# Patient Record
Sex: Female | Born: 1935 | Race: White | Hispanic: No | Marital: Married | Smoking: Never smoker
Health system: Southern US, Community
[De-identification: ages and names within clinical notes are randomized; demographics above are authoritative.]

## PROBLEM LIST (undated history)

## (undated) DIAGNOSIS — N189 Chronic kidney disease, unspecified: Secondary | ICD-10-CM

## (undated) DIAGNOSIS — K5792 Diverticulitis of intestine, part unspecified, without perforation or abscess without bleeding: Secondary | ICD-10-CM

## (undated) DIAGNOSIS — I371 Nonrheumatic pulmonary valve insufficiency: Secondary | ICD-10-CM

## (undated) DIAGNOSIS — I10 Essential (primary) hypertension: Secondary | ICD-10-CM

## (undated) DIAGNOSIS — I351 Nonrheumatic aortic (valve) insufficiency: Secondary | ICD-10-CM

## (undated) DIAGNOSIS — M8580 Other specified disorders of bone density and structure, unspecified site: Secondary | ICD-10-CM

## (undated) DIAGNOSIS — G729 Myopathy, unspecified: Secondary | ICD-10-CM

## (undated) DIAGNOSIS — K589 Irritable bowel syndrome without diarrhea: Secondary | ICD-10-CM

## (undated) DIAGNOSIS — I2510 Atherosclerotic heart disease of native coronary artery without angina pectoris: Secondary | ICD-10-CM

## (undated) DIAGNOSIS — R079 Chest pain, unspecified: Secondary | ICD-10-CM

## (undated) HISTORY — DX: Chronic kidney disease, unspecified: N18.9

## (undated) HISTORY — DX: Nonrheumatic aortic (valve) insufficiency: I35.1

## (undated) HISTORY — DX: Irritable bowel syndrome, unspecified: K58.9

## (undated) HISTORY — PX: DILATION AND CURETTAGE OF UTERUS: SHX78

## (undated) HISTORY — DX: Myopathy, unspecified: G72.9

## (undated) HISTORY — DX: Essential (primary) hypertension: I10

## (undated) HISTORY — DX: Other specified disorders of bone density and structure, unspecified site: M85.80

## (undated) HISTORY — DX: Nonrheumatic pulmonary valve insufficiency: I37.1

## (undated) HISTORY — DX: Diverticulitis of intestine, part unspecified, without perforation or abscess without bleeding: K57.92

## (undated) HISTORY — DX: Chest pain, unspecified: R07.9

## (undated) HISTORY — PX: CARDIAC CATHETERIZATION: SHX172

---

## 1997-12-09 ENCOUNTER — Other Ambulatory Visit: Admission: RE | Admit: 1997-12-09 | Discharge: 1997-12-09 | Payer: Self-pay | Admitting: *Deleted

## 1998-04-14 ENCOUNTER — Ambulatory Visit (HOSPITAL_COMMUNITY): Admission: RE | Admit: 1998-04-14 | Discharge: 1998-04-14 | Payer: Self-pay | Admitting: Gastroenterology

## 1999-05-10 ENCOUNTER — Encounter: Payer: Self-pay | Admitting: *Deleted

## 1999-05-10 ENCOUNTER — Encounter: Admission: RE | Admit: 1999-05-10 | Discharge: 1999-05-10 | Payer: Self-pay | Admitting: *Deleted

## 1999-05-20 ENCOUNTER — Other Ambulatory Visit: Admission: RE | Admit: 1999-05-20 | Discharge: 1999-05-20 | Payer: Self-pay | Admitting: *Deleted

## 1999-06-02 ENCOUNTER — Encounter: Payer: Self-pay | Admitting: *Deleted

## 1999-06-02 ENCOUNTER — Encounter: Admission: RE | Admit: 1999-06-02 | Discharge: 1999-06-02 | Payer: Self-pay | Admitting: *Deleted

## 2001-01-01 ENCOUNTER — Other Ambulatory Visit: Admission: RE | Admit: 2001-01-01 | Discharge: 2001-01-01 | Payer: Self-pay | Admitting: *Deleted

## 2001-01-01 ENCOUNTER — Encounter: Payer: Self-pay | Admitting: *Deleted

## 2001-01-01 ENCOUNTER — Encounter: Admission: RE | Admit: 2001-01-01 | Discharge: 2001-01-01 | Payer: Self-pay | Admitting: *Deleted

## 2001-01-25 ENCOUNTER — Ambulatory Visit (HOSPITAL_COMMUNITY): Admission: RE | Admit: 2001-01-25 | Discharge: 2001-01-25 | Payer: Self-pay | Admitting: Cardiology

## 2002-07-16 ENCOUNTER — Encounter: Payer: Self-pay | Admitting: Family Medicine

## 2002-07-16 ENCOUNTER — Encounter: Admission: RE | Admit: 2002-07-16 | Discharge: 2002-07-16 | Payer: Self-pay | Admitting: Family Medicine

## 2003-05-05 ENCOUNTER — Encounter (INDEPENDENT_AMBULATORY_CARE_PROVIDER_SITE_OTHER): Payer: Self-pay | Admitting: *Deleted

## 2003-05-05 ENCOUNTER — Ambulatory Visit (HOSPITAL_COMMUNITY): Admission: RE | Admit: 2003-05-05 | Discharge: 2003-05-05 | Payer: Self-pay | Admitting: Gastroenterology

## 2004-02-05 ENCOUNTER — Encounter: Admission: RE | Admit: 2004-02-05 | Discharge: 2004-02-05 | Payer: Self-pay | Admitting: Family Medicine

## 2005-12-14 ENCOUNTER — Encounter: Admission: RE | Admit: 2005-12-14 | Discharge: 2005-12-14 | Payer: Self-pay | Admitting: Family Medicine

## 2007-03-06 ENCOUNTER — Encounter: Admission: RE | Admit: 2007-03-06 | Discharge: 2007-03-06 | Payer: Self-pay | Admitting: Family Medicine

## 2008-03-24 ENCOUNTER — Encounter: Admission: RE | Admit: 2008-03-24 | Discharge: 2008-03-24 | Payer: Self-pay | Admitting: Family Medicine

## 2008-04-30 ENCOUNTER — Encounter: Admission: RE | Admit: 2008-04-30 | Discharge: 2008-04-30 | Payer: Self-pay | Admitting: Family Medicine

## 2008-06-24 ENCOUNTER — Encounter (INDEPENDENT_AMBULATORY_CARE_PROVIDER_SITE_OTHER): Payer: Self-pay | Admitting: Interventional Radiology

## 2008-06-24 ENCOUNTER — Other Ambulatory Visit: Admission: RE | Admit: 2008-06-24 | Discharge: 2008-06-24 | Payer: Self-pay | Admitting: Interventional Radiology

## 2008-06-24 ENCOUNTER — Encounter: Admission: RE | Admit: 2008-06-24 | Discharge: 2008-06-24 | Payer: Self-pay | Admitting: Family Medicine

## 2009-05-06 ENCOUNTER — Encounter: Admission: RE | Admit: 2009-05-06 | Discharge: 2009-05-06 | Payer: Self-pay | Admitting: Family Medicine

## 2010-05-11 ENCOUNTER — Other Ambulatory Visit: Payer: Self-pay | Admitting: Family Medicine

## 2010-05-11 DIAGNOSIS — Z1239 Encounter for other screening for malignant neoplasm of breast: Secondary | ICD-10-CM

## 2010-05-20 ENCOUNTER — Ambulatory Visit
Admission: RE | Admit: 2010-05-20 | Discharge: 2010-05-20 | Disposition: A | Discharge status: Home / Self Care | Payer: MEDICARE | Source: Ambulatory Visit | Attending: Family Medicine | Admitting: Family Medicine

## 2010-05-20 DIAGNOSIS — Z1239 Encounter for other screening for malignant neoplasm of breast: Secondary | ICD-10-CM

## 2010-09-02 NOTE — Op Note (Signed)
NAME:  Brooke [name], Brooke [name]                         ACCOUNT NO.:  [id number]   MEDICAL RECORD NO.:  [id number]                   PATIENT TYPE:  AMB   LOCATION:  ENDO                                 FACILITY:  [facility]   PHYSICIAN:  Bernette Redbird, M.D.                DATE OF BIRTH:  1934/10/23   DATE OF PROCEDURE:  05/05/2003  DATE OF DISCHARGE:                                 OPERATIVE REPORT   PROCEDURE PERFORMED:  Upper endoscopy with biopsies.   ENDOSCOPIST:  Florencia Reasons, M.D.   INDICATIONS FOR PROCEDURE:  Heme positive stool and altered bowel habit in a  76 year old female.   FINDINGS:  Small hiatal hernia with esophageal ring.  Mild bile reflux.   DESCRIPTION OF PROCEDURE:  The nature, purpose and risks of the procedure  had been discussed with the patient, who provided written consent.  Sedation  was fentanyl 85 mcg and Versed 8 mg IV prior to this procedure and during  the colonoscopy which followed it.  The fraction of the medication for this  procedure was fentanyl 50 mcg and Versed 5 mg.  There was no problem with  clinical instability during the course of the procedure.   The Olympus small caliber adult video endoscope was passed under direct  vision.  The larynx was not well seen but the vocal cords appeared grossly  normal.   The esophageal mucosa was normal except for an esophageal ring at the  squamocolumnar junction.  No free reflux, reflux esophagitis, Barrett's  esophagus, varices, infection or neoplasia were observed.  There was a small  2 cm hiatal hernia.  There was a little bit of bile refluxed in the stomach  coating the mucosa, without significant gastric erythema and without  erosions, ulcers, polyps or masses.  Retroflex viewing of the proximal  stomach was unremarkable.  The pylorus, duodenal bulb and second duodenum  looked normal.  Although the patient is on aspirin, I did not see any  evidence of aspirin related gastropathy.  I did obtain random  duodenal  mucosal biopsies because of history of altered bowel habit.   The scope was then removed from the patient.  She tolerated the procedure  well and there were no apparent complications.   IMPRESSION:  1. Heme positive stool without source evident on this examination (792.1).  2. Esophageal ring.  3. Mild bile reflux.   PLAN:  1. Await pathology on duodenal biopsies.  2. Proceed to colonoscopic evaluation.                                               Bernette Redbird, M.D.    RB/MEDQ  D:  05/05/2003  T:  05/05/2003  Job:  [id number]   cc:   Otilio Connors. Gerri Spore, M.D.  [phone]  [address] [place]  [place] [address]  Fax: [phone]

## 2010-09-02 NOTE — Op Note (Signed)
NAME:  Brooke [name], DOORN                         ACCOUNT NO.:  [id number]   MEDICAL RECORD NO.:  [id number]                   PATIENT TYPE:  AMB   LOCATION:  ENDO                                 FACILITY:  [facility]   PHYSICIAN:  Bernette Redbird, M.D.                DATE OF BIRTH:  15-Feb-1935   DATE OF PROCEDURE:  05/05/2003  DATE OF DISCHARGE:                                 OPERATIVE REPORT   PROCEDURE:  Colonoscopy with biopsies.   INDICATIONS FOR PROCEDURE:  Heme positive stools, family history of colon  cancer, altered bowel habit.   FINDINGS:  Fairly significant pan colonic diverticulosis with sigmoid  thickening.   DESCRIPTION OF PROCEDURE:  The nature, purpose and risks of the procedure  were familiar to the patient from prior examination  and she provided  written consent. Total sedation for this procedure and the upper endoscopy  which preceded it was Fentanyl 85 micrograms and Versed 8 mg IV. There was  no problem with clinical instability.   The Olympus adjustable tension pediatric video colonoscope was advanced to  the terminal ileum, encountering mild to moderate difficulty getting through  the sigmoid region, which had a great deal of muscular thickening associated  with diverticular  disease.   The terminal ileum had a normal appearance and pullback was performed. The  quality of the preparation was excellent and it was felt that all areas were  well seen.   The colonic mucosa was normal. I did not  see any source of heme positivity.  I did not see any polyps, cancer, colitis or vascular malformations. The  patient did have diverticulosis, both in the proximal colon, and especially  in the sigmoid region where there was a great deal of associated muscular  thickening as noted above. I did not appreciate significant diverticulosis  in the transverse colonic region.   Retroflexion in the rectum was unremarkable. Random colonic mucosal biopsies  were obtained because of  the patient's history of loose stools, altered  bowel habit and even occasional fecal incontinence.   The patient tolerated the procedure well. There were no apparent  complications.   IMPRESSION:  1. Heme positive stools without source identified on the current examination     (792.1).  2. Functional diarrhea, without obvious colitis on the current examination.  3. Family history of colon cancer without  polyps evident on the current     examination.   PLAN:  1. Await pathology results.  2. Follow up colonoscopy in 5 years because of the family history of colon     cancer.                                               Bernette Redbird, M.D.    RB/MEDQ  D:  05/05/2003  T:  05/05/2003  Job:  [id number]   cc:   Otilio Connors. Gerri Spore, M.D.  [address]  [place]  [place] [address]  Fax: [phone]

## 2010-09-02 NOTE — Cardiovascular Report (Signed)
[place]  Patient:    Brooke [name], Brooke [name] Visit Number: [id number] MRN: [id number]          Service Type: CAT Location: [facility] 2866 [id number] Attending Physician:  Eleanora Neighbor Dictated by:   Colleen Can Deborah Chalk, M.D. Proc. Date: 01/25/01 Admit Date:  01/25/2001 Discharge Date: 01/25/2001   CC:         Heather Roberts, M.D.   Cardiac Catheterization  HISTORY:  [name] has a history of longstanding hypertension.  She had an abnormal exercise tolerance test.  She has had exertional substernal discomfort and is referred for cardiac catheterization after having abnormal EKG changes on her stress test.  PROCEDURE:  Left heart catheterization with selective coronary angiography and left ventricular angiography with Perclose.  CARDIOLOGIST:  Colleen Can. Deborah Chalk, M.D.  TYPE AND SITE OF ENTRY:  Percutaneous right femoral artery.  CATHETERS:  6-French 4 curved Judkins right and left coronary catheters, 6-French pigtail ventricular guiding catheter.  CONTRAST: Omnipaque.  MEDICATIONS GIVEN PRIOR TO PROCEDURE:  Valium 10 mg p.o.  MEDICATIONS GIVEN DURING PROCEDURE:  Versed 4 mg IV.  COMMENTS:  The patient tolerated the procedure well.  HEMODYNAMIC DATA:  The aortic pressure was 181/89.  LV was 181/6-16.  The post angiographic LV was 124/6-16, and LV was 130/94.  ANGIOGRAPHIC DATA:  LEFT VENTRICULAR ANGIOGRAM:  The left ventricular angiogram was performed in the RAO position.  Overall cardiac size and silhouette were normal.  Left ventricular ejection fraction was 70 to 80%.  There was no mitral regurgitation, intracardiac calcification, or intracavitary filling defects.  CORONARY ARTERIES: 1. Right coronary artery.  The right coronary artery is a small, dominant    vessel.  It is normal. 2. Left main coronary artery.  The left main coronary artery is short and    normal. 3. Left circumflex.  The left circumflex is a large vessel with  a large    posterolateral branch.  It is normal. 4. Left anterior descending artery.  The left anterior descending was somewhat    tortuous but was normal.  OVERALL IMPRESSION: 1. Normal coronary arteries. 2. Hyperdynamic left ventricle.  DISCUSSION:  It is felt that [name] probably has hypertensive heart disease.  She does not have coronary disease. Dictated by:   Colleen Can Deborah Chalk, M.D. Attending Physician:  Eleanora Neighbor DD:  01/25/01 TD:  01/26/01 Job: [id number]

## 2010-09-02 NOTE — H&P (Signed)
[place]  Patient:    KITARA, HEBB Visit Number: [id number] MRN: [id number]          Service Type: Attending:  Colleen Can. Deborah Chalk, M.D. Dictated by:   Jennet Maduro Earl Gala, R.N., A.N.P. Adm. Date:  01/25/01   CC:         Heather Roberts, M.D.   History and Physical  DATE OF BIRTH:  [birth year]  CHIEF COMPLAINT:  Chest pain.  HISTORY OF PRESENT ILLNESS:  Mrs. Fraleigh is a very pleasant, 76 year old, white female who was referred to our office at the earlier part of this month for evaluation of chest discomfort.  She underwent routine treadmill testing. She has noted an exertional-type discomfort, especially when she walks up a slight grade or hill.  It has tended to be exertional and relieved with rest and has been occurring for quite some time.  There has been mild dyspnea on exertion as well.  There has been no other associated symptoms.  She was exercised on the standard Bruce protocol.  She exercised for a total of 8 minutes and 52 seconds to a maximum of 3.4 miles per hour at a 14% grade. Her heart rate increased to 136 and blood pressure rose to 160/60.  The test was stopped due to fatigue.  There was only minor chest discomfort that was somewhat equivocal with exercise.  Her 12-lead resting electrocardiogram was basically within normal limits. However, with exercise she developed rather marked inferior ST segment depression, as well as ST elevation in lead aVL.  Her ST segments were upsloping, but inferiorly there was approximately 3 mm of depression and laterally approximately 2 mm of depression.  In recovery, she had ST segment flattening that would be consistent with ischemia.  In light of the abnormal treadmill test, as well as her complaints of chest discomfort, she is now referred on for elective coronary angiography.  PAST MEDICAL HISTORY:  Hypertension x 15 years.  There are no reports of stroke, previous heart disease, or  abnormal cholesterol levels.  ALLERGIES:  SULFA.  CURRENT MEDICATIONS: 1. Captopril 25 mg a day. 2. Ziac 2.5/6.25 mg daily. 3. Baby aspirin daily. 4. Multivitamins daily.  FAMILY HISTORY:  Positive for heart disease, however, her mother is alive at the age of 41.  SOCIAL HISTORY:  She is married.  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  There is no alcohol or tobacco.  REVIEW OF SYSTEMS:  As stated above and is otherwise unremarkable.  PHYSICAL EXAMINATION:  She is a very pleasant white female who appears younger than her stated age.  VITAL SIGNS:  Blood pressure 140/70, heart rate 64, respirations 18.  She is afebrile.  SKIN:  Warm and dry.  Color was unremarkable.  NECK:  Supple.  No JVD.  No thyromegaly.  No carotid bruits.  LUNGS:  Clear to auscultation.  CARDIAC:  Regular rhythm without murmur or gallop.  ABDOMEN:  Soft.  Positive bowel sounds.  Nontender without masses.  EXTREMITIES:  Without edema.  Distal pulses are intact.  NEUROLOGIC:  Intact with no gross focal deficits.  She is quite pleasant and conversive.  LABORATORY DATA:  Pending.  OVERALL IMPRESSION: 1. Chest discomfort. 2. Abnormal exercise test. 3. Positive family history for coronary disease.  PLAN:  Will proceed on with elective coronary angiography.  The procedure has been discussed in lengthy detail with the patient to include the risks and benefits.  She is willing to proceed and we will plan on  undergoing cardiac catheterization on Friday, January 25, 2001. Dictated by:   Jennet Maduro Earl Gala, R.N., A.N.P. Attending:  Colleen Can. Deborah Chalk, M.D. DD:  01/23/01 TD:  01/23/01 Job: [id number]

## 2011-05-05 ENCOUNTER — Other Ambulatory Visit: Payer: Self-pay | Admitting: Family Medicine

## 2011-05-05 DIAGNOSIS — Z1231 Encounter for screening mammogram for malignant neoplasm of breast: Secondary | ICD-10-CM

## 2011-06-06 ENCOUNTER — Ambulatory Visit
Admission: RE | Admit: 2011-06-06 | Discharge: 2011-06-06 | Disposition: A | Discharge status: Home / Self Care | Payer: Medicare Other | Source: Ambulatory Visit | Attending: Family Medicine | Admitting: Family Medicine

## 2011-06-06 DIAGNOSIS — Z1231 Encounter for screening mammogram for malignant neoplasm of breast: Secondary | ICD-10-CM

## 2012-06-19 ENCOUNTER — Other Ambulatory Visit: Payer: Self-pay

## 2012-06-19 DIAGNOSIS — Z1231 Encounter for screening mammogram for malignant neoplasm of breast: Secondary | ICD-10-CM

## 2012-07-22 ENCOUNTER — Ambulatory Visit
Admission: RE | Admit: 2012-07-22 | Discharge: 2012-07-22 | Disposition: A | Discharge status: Home / Self Care | Payer: Medicare Other | Source: Ambulatory Visit

## 2012-07-22 DIAGNOSIS — Z1231 Encounter for screening mammogram for malignant neoplasm of breast: Secondary | ICD-10-CM

## 2013-07-16 ENCOUNTER — Other Ambulatory Visit (HOSPITAL_COMMUNITY): Payer: Medicare Other

## 2013-07-18 ENCOUNTER — Ambulatory Visit (HOSPITAL_COMMUNITY): Payer: Medicare Other | Attending: Cardiovascular Disease | Admitting: Cardiology

## 2013-07-18 ENCOUNTER — Encounter: Payer: Self-pay | Admitting: Cardiology

## 2013-07-18 DIAGNOSIS — I359 Nonrheumatic aortic valve disorder, unspecified: Secondary | ICD-10-CM

## 2013-07-18 DIAGNOSIS — R079 Chest pain, unspecified: Secondary | ICD-10-CM | POA: Insufficient documentation

## 2013-07-18 DIAGNOSIS — R0989 Other specified symptoms and signs involving the circulatory and respiratory systems: Secondary | ICD-10-CM | POA: Insufficient documentation

## 2013-07-18 DIAGNOSIS — R0609 Other forms of dyspnea: Secondary | ICD-10-CM | POA: Insufficient documentation

## 2013-07-18 DIAGNOSIS — I379 Nonrheumatic pulmonary valve disorder, unspecified: Secondary | ICD-10-CM | POA: Insufficient documentation

## 2013-07-18 DIAGNOSIS — I10 Essential (primary) hypertension: Secondary | ICD-10-CM | POA: Insufficient documentation

## 2013-07-18 NOTE — Progress Notes (Signed)
Echo completed

## 2013-10-14 ENCOUNTER — Other Ambulatory Visit: Payer: Self-pay

## 2013-10-14 DIAGNOSIS — Z1231 Encounter for screening mammogram for malignant neoplasm of breast: Secondary | ICD-10-CM

## 2013-10-20 ENCOUNTER — Ambulatory Visit
Admission: RE | Admit: 2013-10-20 | Discharge: 2013-10-20 | Disposition: A | Discharge status: Home / Self Care | Payer: Medicare Other | Source: Ambulatory Visit

## 2013-10-20 ENCOUNTER — Encounter (INDEPENDENT_AMBULATORY_CARE_PROVIDER_SITE_OTHER): Payer: Self-pay

## 2013-10-20 DIAGNOSIS — Z1231 Encounter for screening mammogram for malignant neoplasm of breast: Secondary | ICD-10-CM

## 2014-06-26 ENCOUNTER — Other Ambulatory Visit: Payer: Self-pay | Admitting: Dermatology

## 2014-08-11 ENCOUNTER — Encounter: Payer: Self-pay | Admitting: *Deleted

## 2014-08-14 ENCOUNTER — Encounter: Payer: Self-pay | Admitting: Cardiology

## 2014-08-14 ENCOUNTER — Ambulatory Visit (INDEPENDENT_AMBULATORY_CARE_PROVIDER_SITE_OTHER): Payer: Medicare Other | Admitting: Cardiology

## 2014-08-14 VITALS — BP 116/70 | HR 90 | Ht 68.0 in | Wt 150.0 lb

## 2014-08-14 DIAGNOSIS — I351 Nonrheumatic aortic (valve) insufficiency: Secondary | ICD-10-CM | POA: Diagnosis not present

## 2014-08-14 DIAGNOSIS — R0609 Other forms of dyspnea: Secondary | ICD-10-CM | POA: Diagnosis not present

## 2014-08-14 DIAGNOSIS — I10 Essential (primary) hypertension: Secondary | ICD-10-CM

## 2014-08-14 DIAGNOSIS — R0789 Other chest pain: Secondary | ICD-10-CM

## 2014-08-14 NOTE — Patient Instructions (Signed)
Medication Instructions:  Your physician recommends that you continue on your current medications as directed. Please refer to the Current Medication list given to you today.  Labwork: No new orders.   Testing/Procedures: Your physician has requested that you have an exercise stress myoview. For further information please visit [email or web address]. Please follow instruction sheet, as given.  Follow-Up: Your physician recommends that you schedule a follow-up appointment in: 1 MONTH with Dr Meda Coffee  Any Other Special Instructions Will Be Listed Below (If Applicable).

## 2014-08-14 NOTE — Progress Notes (Signed)
Patient ID: Brooke [name], female   DOB: [birth year], 80 y.o.   MRN: [id number]      Cardiology Office Note   Date:  08/14/2014   ID:  Brooke [name], DOB 01-31-1935, MRN [id number]  PCP:  Jonathon Bellows, MD  Cardiologist:   Dorothy Spark, MD   Chief complain: Dyspnea on exertion   History of Present Illness: Brooke [name] is a 80 y.o. female who presents for evaluation of dyspnea on exertion. The patient states that for few years she has been feeling dyspneic on exertion. She has had stress that over 10 years ago that was negative. She underwent echocardiogram the last year that showed mild-to-moderate aortic insufficiency, otherwise normal study. Patient states that her dyspnea has been worsening in frequency now associated mild exertion and associated with chest pressure and pain in her jaw. She is his example working in her backyard would lead into profound dyspnea and fatigue forcing her to take rest. The patient is an medication for blood pressure vitamin D supplements and baby aspirin. She was told in the past that her cholesterol is okay and doesn't need to be addressed. She has a family history of coronary artery disease her father had myocardial infarction in his 70s.  Past Medical History  Diagnosis Date  . HTN (hypertension)   . Osteopenia   . IBS (irritable bowel syndrome)   . Diverticulitis   . Chest pain     Past Surgical History  Procedure Laterality Date  . Cardiac catheterization    . Dilation and curettage of uterus       Current Outpatient Prescriptions  Medication Sig Dispense Refill  . aspirin 81 MG tablet Take 81 mg by mouth daily.    . Calcium-Phosphorus-Vitamin D (CITRACAL +D3 PO) Take 1 tablet by mouth daily.    . Cholecalciferol (VITAMIN D-3) 1000 UNITS CAPS Take 1,000 Units by mouth 2 (two) times daily.    . Diphenhydramine-APAP, sleep, (TYLENOL PM EXTRA STRENGTH PO) Take 1 tablet by mouth daily.    . enalapril (VASOTEC) 10 MG tablet Take 10 mg  by mouth daily.    . hydrochlorothiazide (MICROZIDE) 12.5 MG capsule Take 12.5 mg by mouth daily.    . Multiple Vitamins-Minerals (CENTRUM SILVER PO) Take 1 tablet by mouth daily.    Marland Kitchen OVER THE COUNTER MEDICATION Take by mouth daily. IMODIUM AD...Marland KitchenPATIENT TAKES HALF (1/2) TABLET    . terbinafine (LAMISIL) 1 % cream Apply 1 application topically 2 (two) times daily.    Marland Kitchen terbinafine (LAMISIL) 250 MG tablet Take 250 mg by mouth daily.     No current facility-administered medications for this visit.    Allergies:   Macrobid and Sulfa antibiotics    Social History:  The patient  reports that she has never smoked. She does not have any smokeless tobacco history on file. She reports that she drinks alcohol. She reports that she does not use illicit drugs.   Family History:  The patient's family history includes Colon cancer in her mother; Heart attack in her father; Heart disease in her brother; Hypertension in her brother.   ROS:  Please see the history of present illness.   All other systems are reviewed and negative.   PHYSICAL EXAM: VS:  BP 116/70 mmHg  Pulse 90  Ht 5\' 8"  (1.727 m)  Wt 150 lb (68.04 kg)  BMI 22.81 kg/m2 , BMI Body mass index is 22.81 kg/(m^2). GEN: Well nourished, well developed, in no acute distress HEENT:  normal Neck: no JVD, carotid bruits, or masses Cardiac: RRR; 2/6 diastolic murmur rubs, or gallops,no edema  Respiratory:  clear to auscultation bilaterally, normal work of breathing GI: soft, nontender, nondistended, + BS MS: no deformity or atrophy Skin: warm and dry, no rash Neuro:  Strength and sensation are intact Psych: euthymic mood, full affect  EKG:  EKG is ordered today. The ekg ordered today demonstrates sinus rhythm, right atrial enlargement, borderline EKG  Recent Labs: No results found for requested labs within last 365 days.   Lipid Panel No results found for: CHOL, TRIG, HDL, CHOLHDL, VLDL, LDLCALC, LDLDIRECT   Wt Readings from Last 3  Encounters:  08/14/14 150 lb (68.04 kg)    Echocardiogram performed in April 2015 Left ventricle: The cavity size was normal. There was mild concentric hypertrophy. Systolic function was normal. Wall motion was normal; there were no regional wall motion abnormalities. Doppler parameters are consistent with abnormal left ventricular relaxation (grade 1 diastolic dysfunction). - Aortic valve: Mild to moderate regurgitation.   ASSESSMENT AND PLAN:  80 year old female younger appearing  1. Typical exertional dyspnea associated with jaw pain, we'll schedule an exercise nuclear stress test.  2. Mild to moderate aortic insufficiency - for now we'll just follow  3. Hypertension - well controlled  4. Hyperlipidemia - HDL 43, triglycerides 131 LDL 109, if her stress test abnormal we will start her statin.   Current medicines are reviewed at length with the patient today.  The patient does not have concerns regarding medicines.  The following changes have been made:  no change  Labs/ tests ordered today include:   Orders Placed This Encounter  Procedures  . Myocardial Perfusion Imaging  . Electrocardiogram report   Follow up in 2 months.  Signed, Dorothy Spark, MD  08/14/2014 1:06 PM    [facility] [place], [place], [place]  [address] Phone: [phone]; Fax: [phone]

## 2014-08-25 ENCOUNTER — Telehealth (HOSPITAL_COMMUNITY): Payer: Self-pay

## 2014-08-25 NOTE — Telephone Encounter (Signed)
Patient given detailed instructions per Myocardial Perfusion Study Information Sheet for test on 08-26-2014 at 11:30am. Patient verbalized understanding. Brooke [name], Xiao Graul A

## 2014-08-26 ENCOUNTER — Ambulatory Visit (HOSPITAL_COMMUNITY): Payer: Medicare Other | Attending: Cardiology

## 2014-08-26 DIAGNOSIS — R0609 Other forms of dyspnea: Secondary | ICD-10-CM | POA: Diagnosis not present

## 2014-08-26 DIAGNOSIS — R0789 Other chest pain: Secondary | ICD-10-CM | POA: Diagnosis not present

## 2014-08-26 DIAGNOSIS — R079 Chest pain, unspecified: Secondary | ICD-10-CM | POA: Diagnosis not present

## 2014-08-26 DIAGNOSIS — I10 Essential (primary) hypertension: Secondary | ICD-10-CM | POA: Diagnosis not present

## 2014-08-26 LAB — MYOCARDIAL PERFUSION IMAGING
Estimated workload: 5.2 METS
Exercise duration (min): 3 min
Exercise duration (sec): 30 s
LV dias vol: 60 mL
LV sys vol: 13 mL
MPHR: 144 {beats}/min
Nuc Stress EF: 78 %
Peak BP: 209 mmHg
Peak HR: 144 {beats}/min
Percent HR: 100 %
Percent of predicted max HR: 100 %
RATE: 0.31
RPE: 15
Rest HR: 74 {beats}/min
SDS: 4
SRS: 5
SSS: 9
Stage 1 DBP: 74 mmHg
Stage 1 Grade: 0 %
Stage 1 HR: 84 {beats}/min
Stage 1 SBP: 157 mmHg
Stage 1 Speed: 0 mph
Stage 2 DBP: 73 mmHg
Stage 2 Grade: 0 %
Stage 2 HR: 85 {beats}/min
Stage 2 SBP: 168 mmHg
Stage 2 Speed: 0 mph
Stage 3 Grade: 0 %
Stage 3 HR: 86 {beats}/min
Stage 3 Speed: 0 mph
Stage 4 Grade: 10 %
Stage 4 HR: 139 {beats}/min
Stage 4 Speed: 1.7 mph
Stage 5 DBP: 49 mmHg
Stage 5 Grade: 12 %
Stage 5 HR: 144 {beats}/min
Stage 5 SBP: 209 mmHg
Stage 5 Speed: 2.5 mph
Stage 6 DBP: 50 mmHg
Stage 6 Grade: 0 %
Stage 6 HR: 133 {beats}/min
Stage 6 SBP: 210 mmHg
Stage 6 Speed: 0 mph
Stage 7 Grade: 0 %
Stage 7 Speed: 0 mph
TID: 1

## 2014-08-26 MED ORDER — TECHNETIUM TC 99M SESTAMIBI GENERIC - CARDIOLITE
11.0000 | Freq: Once | INTRAVENOUS | Status: AC | PRN
  Administered 2014-08-26: 11 via INTRAVENOUS

## 2014-08-26 MED ORDER — TECHNETIUM TC 99M SESTAMIBI GENERIC - CARDIOLITE
33.0000 | Freq: Once | INTRAVENOUS | Status: AC | PRN
  Administered 2014-08-26: 33 via INTRAVENOUS

## 2014-09-08 ENCOUNTER — Ambulatory Visit (INDEPENDENT_AMBULATORY_CARE_PROVIDER_SITE_OTHER): Payer: Medicare Other | Admitting: Cardiology

## 2014-09-08 ENCOUNTER — Encounter: Payer: Self-pay | Admitting: Cardiology

## 2014-09-08 VITALS — BP 148/52 | HR 94 | Ht 68.0 in | Wt 151.0 lb

## 2014-09-08 DIAGNOSIS — I351 Nonrheumatic aortic (valve) insufficiency: Secondary | ICD-10-CM | POA: Diagnosis not present

## 2014-09-08 DIAGNOSIS — I10 Essential (primary) hypertension: Secondary | ICD-10-CM

## 2014-09-08 DIAGNOSIS — R0609 Other forms of dyspnea: Secondary | ICD-10-CM

## 2014-09-08 MED ORDER — AMLODIPINE BESYLATE 2.5 MG PO TABS: 2.5000 mg | ORAL_TABLET | Freq: Every day | ORAL | Status: DC

## 2014-09-08 NOTE — Progress Notes (Signed)
Patient ID: RAJVI ARMENTOR, female   DOB: 1935-02-28, 80 y.o.   MRN: [id number]      Cardiology Office Note   Date:  09/08/2014   ID:  LALANIA HASEMAN, DOB [birth year], MRN [id number]  PCP:  Jonathon Bellows, MD  Cardiologist:   Dorothy Spark, MD   Chief complain: Dyspnea on exertion   History of Present Illness: Brooke [name] is a 80 y.o. female who presents for evaluation of dyspnea on exertion. The patient states that for few years she has been feeling dyspneic on exertion. She has had stress that over 10 years ago that was negative. She underwent echocardiogram the last year that showed mild-to-moderate aortic insufficiency, otherwise normal study. Patient states that her dyspnea has been worsening in frequency now associated mild exertion and associated with chest pressure and pain in her jaw. She is his example working in her backyard would lead into profound dyspnea and fatigue forcing her to take rest. The patient is an medication for blood pressure vitamin D supplements and baby aspirin. She was told in the past that her cholesterol is okay and doesn't need to be addressed. She has a family history of coronary artery disease her father had myocardial infarction in his 36s.  The patient had a negative stress test for ischemia, but hypertensive response to stress, she continues to have significant DOE limiting her ADL.  Past Medical History  Diagnosis Date  . HTN (hypertension)   . Osteopenia   . IBS (irritable bowel syndrome)   . Diverticulitis   . Chest pain     Past Surgical History  Procedure Laterality Date  . Cardiac catheterization    . Dilation and curettage of uterus       Current Outpatient Prescriptions  Medication Sig Dispense Refill  . aspirin 81 MG tablet Take 81 mg by mouth daily.    . Calcium-Phosphorus-Vitamin D (CITRACAL +D3 PO) Take 1 tablet by mouth daily.    . Cholecalciferol (VITAMIN D-3) 1000 UNITS CAPS Take 1,000 Units by mouth daily.     .  enalapril (VASOTEC) 10 MG tablet Take 10 mg by mouth daily.    . hydrochlorothiazide (MICROZIDE) 12.5 MG capsule Take 12.5 mg by mouth daily.    . Loperamide HCl (IMODIUM PO) Take 0.5 tablets by mouth as needed.    . Multiple Vitamins-Minerals (CENTRUM SILVER PO) Take 1 tablet by mouth daily.    Marland Kitchen terbinafine (LAMISIL) 1 % cream Apply 1 application topically 2 (two) times daily.    Marland Kitchen terbinafine (LAMISIL) 250 MG tablet Take 250 mg by mouth daily.     No current facility-administered medications for this visit.    Allergies:   Macrobid and Sulfa antibiotics    Social History:  The patient  reports that she has never smoked. She does not have any smokeless tobacco history on file. She reports that she drinks alcohol. She reports that she does not use illicit drugs.   Family History:  The patient's family history includes Colon cancer in her mother; Heart attack in her father; Heart disease in her brother; Hypertension in her brother.   ROS:  Please see the history of present illness.   All other systems are reviewed and negative.   PHYSICAL EXAM: VS:  BP 148/52 mmHg  Pulse 94  Ht 5\' 8"  (1.727 m)  Wt 151 lb (68.493 kg)  BMI 22.96 kg/m2  SpO2 97% , BMI Body mass index is 22.96 kg/(m^2). GEN: Well nourished, well developed,  in no acute distress HEENT: normal Neck: no JVD, carotid bruits, or masses Cardiac: RRR; 2/6 diastolic murmur rubs, or gallops,no edema  Respiratory:  clear to auscultation bilaterally, normal work of breathing GI: soft, nontender, nondistended, + BS MS: no deformity or atrophy Skin: warm and dry, no rash Neuro:  Strength and sensation are intact Psych: euthymic mood, full affect  EKG:  EKG is ordered today. The ekg ordered today demonstrates sinus rhythm, right atrial enlargement, borderline EKG  Recent Labs: No results found for requested labs within last 365 days.   Lipid Panel No results found for: CHOL, TRIG, HDL, CHOLHDL, VLDL, LDLCALC, LDLDIRECT     Wt Readings from Last 3 Encounters:  09/08/14 151 lb (68.493 kg)  08/26/14 148 lb (67.132 kg)  08/14/14 150 lb (68.04 kg)    Echocardiogram performed in April 2015 Left ventricle: The cavity size was normal. There was mild concentric hypertrophy. Systolic function was normal. Wall motion was normal; there were no regional wall motion abnormalities. Doppler parameters are consistent with abnormal left ventricular relaxation (grade 1 diastolic dysfunction). - Aortic valve: Mild to moderate regurgitation.   ASSESSMENT AND PLAN:  80 year old female younger appearing  1. Typical exertional dyspnea associated with jaw pain, an exercise nuclear stress test showed poor exercise capacity, no infarct or ischemia but hypertensive response to stress. There is mild concentric LVH on echocardiogram. We will add amlodipine 2.5 mg PO daily. We will oredr PFTs and refer to pulmonary. If PFTs normal we will consider work up for chronic thromboembolic disease.  2. Mild to moderate aortic insufficiency - add amlodipine, repeat echo in 2 years  3. Hypertension - add amlodipine  4. Hyperlipidemia - HDL 43, triglycerides 131 LDL 109,lifestyle modifications for now.  Current medicines are reviewed at length with the patient today.  The patient does not have concerns regarding medicines.  The following changes have been made:  no change  Labs/ tests ordered today include:   Orders Placed This Encounter  Procedures  . Myocardial Perfusion Imaging  . Electrocardiogram report   Follow up in 3 months.  Signed, Dorothy Spark, MD  09/08/2014 3:37 PM    [name] [place], [place], [place]  [address] Phone: [phone]; Fax: [phone]

## 2014-09-08 NOTE — Patient Instructions (Signed)
Medication Instructions:   Your physician has recommended you make the following change in your medication:   START TAKING AMLODIPINE 2.5 MG ONCE DAILY    Testing/Procedures:   Your physician has recommended that you have a pulmonary function test. Pulmonary Function Tests are a group of tests that measure how well air moves in and out of your lungs.     You have been referred to PULMONOLOGY FOR DYSPNEA ON EXERTION    Follow-Up:  3 MONTHS WITH DR Meda Coffee

## 2014-09-15 ENCOUNTER — Ambulatory Visit (HOSPITAL_COMMUNITY)
Admission: RE | Admit: 2014-09-15 | Discharge: 2014-09-15 | Disposition: A | Discharge status: Home / Self Care | Payer: Medicare Other | Source: Ambulatory Visit | Attending: Cardiology | Admitting: Cardiology

## 2014-09-15 DIAGNOSIS — R0609 Other forms of dyspnea: Secondary | ICD-10-CM

## 2014-09-15 LAB — PULMONARY FUNCTION TEST
DL/VA % pred: 51 %
DL/VA: 2.67 ml/min/mmHg/L
DLCO unc % pred: 41 %
DLCO unc: 11.86 ml/min/mmHg
FEF 25-75 Post: 2.29 L/sec
FEF 25-75 Pre: 2.51 L/sec
FEF2575-%Change-Post: -8 %
FEF2575-%Pred-Post: 134 %
FEF2575-%Pred-Pre: 147 %
FEV1-%Change-Post: -1 %
FEV1-%Pred-Post: 98 %
FEV1-%Pred-Pre: 100 %
FEV1-Post: 2.28 L
FEV1-Pre: 2.32 L
FEV1FVC-%Change-Post: -1 %
FEV1FVC-%Pred-Pre: 111 %
FEV6-%Change-Post: 1 %
FEV6-%Pred-Post: 95 %
FEV6-%Pred-Pre: 93 %
FEV6-Post: 2.79 L
FEV6-Pre: 2.75 L
FEV6FVC-%Change-Post: 0 %
FEV6FVC-%Pred-Post: 105 %
FEV6FVC-%Pred-Pre: 105 %
FVC-%Change-Post: 0 %
FVC-%Pred-Post: 90 %
FVC-%Pred-Pre: 91 %
FVC-Post: 2.8 L
FVC-Pre: 2.82 L
Post FEV1/FVC ratio: 81 %
Post FEV6/FVC ratio: 100 %
Pre FEV1/FVC ratio: 82 %
Pre FEV6/FVC Ratio: 100 %
RV % pred: 320 %
RV: 8 L
TLC % pred: 200 %
TLC: 11.03 L

## 2014-09-15 MED ORDER — ALBUTEROL SULFATE (2.5 MG/3ML) 0.083% IN NEBU
2.5000 mg | INHALATION_SOLUTION | Freq: Once | RESPIRATORY_TRACT | Status: AC
  Administered 2014-09-15: 2.5 mg via RESPIRATORY_TRACT

## 2014-09-17 ENCOUNTER — Ambulatory Visit (INDEPENDENT_AMBULATORY_CARE_PROVIDER_SITE_OTHER)
Admission: RE | Admit: 2014-09-17 | Discharge: 2014-09-17 | Disposition: A | Discharge status: Home / Self Care | Payer: Medicare Other | Source: Ambulatory Visit | Attending: Cardiology | Admitting: Cardiology

## 2014-09-17 ENCOUNTER — Telehealth: Payer: Self-pay | Admitting: *Deleted

## 2014-09-17 DIAGNOSIS — R942 Abnormal results of pulmonary function studies: Secondary | ICD-10-CM | POA: Diagnosis not present

## 2014-09-17 DIAGNOSIS — R0602 Shortness of breath: Secondary | ICD-10-CM

## 2014-09-17 DIAGNOSIS — R0609 Other forms of dyspnea: Secondary | ICD-10-CM

## 2014-09-17 NOTE — Telephone Encounter (Signed)
-----   Message from Dorothy Spark, MD sent at 09/17/2014 10:25 AM EDT ----- She has abnormal PFTs and needs to be referred to pulmonary

## 2014-09-17 NOTE — Telephone Encounter (Signed)
Contacted the pt to inform her that per Dr Meda Coffee her PFT results were abnormal and she suggested the pt keep her scheduled appt with Pulmonary with Dr Lamonte Sakai on 6/7 at 3:30 pm, and also have a 2-view chest xray done.  Informed the pt that I will place the chest x ray order in the system and informed her of many different locations to go and get this image performed.  Pt selected to have her chest x ray ordered at [facility] on [address].  Informed the pt she can walk-in to have this done, not time specified.  Pt verbalized understanding and agrees with this plan.

## 2014-09-22 ENCOUNTER — Encounter: Payer: Self-pay | Admitting: Emergency Medicine

## 2014-09-22 ENCOUNTER — Ambulatory Visit (INDEPENDENT_AMBULATORY_CARE_PROVIDER_SITE_OTHER): Payer: Medicare Other | Admitting: Emergency Medicine

## 2014-09-22 VITALS — BP 120/70 | HR 85 | Ht 67.0 in | Wt 148.0 lb

## 2014-09-22 DIAGNOSIS — R0609 Other forms of dyspnea: Secondary | ICD-10-CM

## 2014-09-22 DIAGNOSIS — R0600 Dyspnea, unspecified: Secondary | ICD-10-CM

## 2014-09-22 NOTE — Patient Instructions (Signed)
Your airflow on your pulmonary function testing is normal We will repeat a component of your pulmonary function testing to compare with May 2016. If we see an abnormality then we will initiate further workup.  Walking oximetry today Follow with Dr Lamonte Sakai next available after your testing.

## 2014-09-22 NOTE — Assessment & Plan Note (Signed)
Etiology of her dyspnea is unclear. She does note that she was hypertensive when she had her myocardial perfusion scan. She feels better after being changed to amlodipine from her beta blocker. Pulmonary function testing is interesting- shows normal airflow, a decreased diffusion capacity, and significant hyperinflation. I suspect that we are getting false data as the hyperinflation does not make sense here. He is to have repeat lung volumes and diffusion capacity performed. If her diffusion capacity remains low then we will expand her workup to look for interstitial or pulmonary vascular disease. There is no evidence of pulmonary hypertension on her echocardiogram.

## 2014-09-22 NOTE — Progress Notes (Signed)
Subjective:    Patient ID: Brooke [name], female    DOB: 1935/02/25, 80 y.o.   MRN: [id number]  HPI 80 year old never smoker with a history of hypertension, IBS, she has been under evaluation by Dr. Meda Coffee in cardiology for exertional dyspnea. My review of the medical chart shows that she had an echocardiogram in 78/2015 that showed some aortic insufficiency without any left ventricular dysfunction. Based on her dyspnea she underwent a myocardial perfusion study on 08/25/76 there was a low risk pharmacological test with normal left ventricular function but she did have severe HTN during the test. PFT were performed on 09/14/76 and reviewed by me > normal airflows, ? Whether the lung volumes are accurate (significant hyperinflation), decrease DLCO. I reviewed her chest x-ray from 09/16/76.   Beginning many yrs ago noted exertional SOB associated with CP and neck tightness. This year it has been more bothersome. Unable to do her yard work this year. She wonders if maybe some of her problems related to changes in her medications, her b-blocker. She has now been changed to amlodipine (new) + enalapril + HCTZ.    Review of Systems  Constitutional: Negative for fever and unexpected weight change.  HENT: Negative for congestion, dental problem, ear pain, nosebleeds, postnasal drip, rhinorrhea, sinus pressure, sneezing, sore throat and trouble swallowing.   Eyes: Negative for redness and itching.  Respiratory: Positive for chest tightness and shortness of breath. Negative for cough and wheezing.   Cardiovascular: Negative for palpitations and leg swelling.  Gastrointestinal: Negative for nausea and vomiting.  Genitourinary: Negative for dysuria.  Musculoskeletal: Negative for joint swelling.  Skin: Negative for rash.  Neurological: Negative for headaches.  Hematological: Does not bruise/bleed easily.  Psychiatric/Behavioral: Negative for dysphoric mood. The patient is not nervous/anxious.    Past  Medical History  Diagnosis Date  . HTN (hypertension)   . Osteopenia   . IBS (irritable bowel syndrome)   . Diverticulitis   . Chest pain      Family History  Problem Relation Age of Onset  . Heart attack Father   . Colon cancer Mother   . Heart disease Brother   . Hypertension Brother      History   Social History  . Marital Status: Married    Spouse Name: N/A  . Number of Children: N/A  . Years of Education: N/A   Occupational History  . Not on file.   Social History Main Topics  . Smoking status: Never Smoker   . Smokeless tobacco: Not on file  . Alcohol Use: 0.0 oz/week    0 Standard drinks or equivalent per week  . Drug Use: No  . Sexual Activity: Not on file   Other Topics Concern  . Not on file   Social History Narrative     Allergies  Allergen Reactions  . Macrobid [Nitrofurantoin Monohyd Macro] Hives and Rash  . Sulfa Antibiotics Hives and Rash     Outpatient Prescriptions Prior to Visit  Medication Sig Dispense Refill  . amLODipine (NORVASC) 2.5 MG tablet Take 1 tablet (2.5 mg total) by mouth daily. 180 tablet 3  . aspirin 81 MG tablet Take 81 mg by mouth daily.    . Calcium-Phosphorus-Vitamin D (CITRACAL +D3 PO) Take 1 tablet by mouth daily.    . Cholecalciferol (VITAMIN D-3) 1000 UNITS CAPS Take 1,000 Units by mouth daily.     . enalapril (VASOTEC) 10 MG tablet Take 10 mg by mouth daily.    . hydrochlorothiazide (MICROZIDE)  12.5 MG capsule Take 12.5 mg by mouth daily.    . Loperamide HCl (IMODIUM PO) Take 0.5 tablets by mouth as needed.    . Multiple Vitamins-Minerals (CENTRUM SILVER PO) Take 1 tablet by mouth daily.    Marland Kitchen terbinafine (LAMISIL) 1 % cream Apply 1 application topically 2 (two) times daily.    Marland Kitchen terbinafine (LAMISIL) 250 MG tablet Take 250 mg by mouth daily.     No facility-administered medications prior to visit.        Objective:   Physical Exam Filed Vitals:   09/22/14 1533  BP: 120/70  Pulse: 85  Height: 5\' 7"  (1.702  m)  Weight: 148 lb (67.132 kg)  SpO2: 94%   Gen: Pleasant, well-nourished, in no distress,  normal affect  ENT: No lesions,  mouth clear,  oropharynx clear, no postnasal drip  Neck: No JVD, no TMG, no carotid bruits  Lungs: No use of accessory muscles, clear without rales or rhonchi  Cardiovascular: RRR, heart sounds normal, no murmur or gallops, no peripheral edema  Musculoskeletal: No deformities, no cyanosis or clubbing  Neuro: alert, non focal  Skin: Warm, no lesions or rashes   09/17/14 --  COMPARISON: None.  FINDINGS: The lungs are hyperinflated with mild hemidiaphragm flattening. There is hazy increased density in the right infrahilar region without air bronchograms. The heart and pulmonary vascularity are normal. The mediastinum is normal in width. There is no pleural effusion. There is minimal apical pleural thickening bilaterally. There is gentle dextrocurvature of the mid to lower thoracic spine. The vertebral bodies are preserved in height.  IMPRESSION: COPD. There may be superimposed acute bronchitic changes. There is no pneumonia nor CHF     Assessment & [name]:  DOE (dyspnea on exertion) Etiology of her dyspnea is unclear. She does note that she was hypertensive when she had her myocardial perfusion scan. She feels better after being changed to amlodipine from her beta blocker. Pulmonary function testing is interesting- shows normal airflow, a decreased diffusion capacity, and significant hyperinflation. I suspect that we are getting false data as the hyperinflation does not make sense here. He is to have repeat lung volumes and diffusion capacity performed. If her diffusion capacity remains low then we will expand her workup to look for interstitial or pulmonary vascular disease. There is no evidence of pulmonary hypertension on her echocardiogram.

## 2014-11-13 ENCOUNTER — Ambulatory Visit: Payer: Medicare Other | Admitting: Emergency Medicine

## 2014-12-10 ENCOUNTER — Ambulatory Visit (INDEPENDENT_AMBULATORY_CARE_PROVIDER_SITE_OTHER): Payer: Medicare Other | Admitting: Cardiology

## 2014-12-10 ENCOUNTER — Encounter: Payer: Self-pay | Admitting: Cardiology

## 2014-12-10 VITALS — BP 134/62 | HR 83 | Ht 67.0 in | Wt 151.0 lb

## 2014-12-10 DIAGNOSIS — I351 Nonrheumatic aortic (valve) insufficiency: Secondary | ICD-10-CM

## 2014-12-10 DIAGNOSIS — I10 Essential (primary) hypertension: Secondary | ICD-10-CM

## 2014-12-10 DIAGNOSIS — R0609 Other forms of dyspnea: Secondary | ICD-10-CM | POA: Diagnosis not present

## 2014-12-10 DIAGNOSIS — E785 Hyperlipidemia, unspecified: Secondary | ICD-10-CM

## 2014-12-10 DIAGNOSIS — R0600 Dyspnea, unspecified: Secondary | ICD-10-CM

## 2014-12-10 NOTE — Patient Instructions (Signed)
Your physician recommends that you continue on your current medications as directed. Please refer to the Current Medication list given to you today.    Your physician recommends that you schedule a follow-up appointment in: 3 MONTHS WITH DR NELSON  

## 2014-12-10 NOTE — Progress Notes (Signed)
Patient ID: Brooke [name], female   DOB: [birth year], 80 y.o.   MRN: [id number]      Cardiology Office Note   Date:  12/10/2014   ID:  Brooke [name], DOB May 05, 1934, MRN [id number]  PCP:  Jonathon Bellows, MD  Cardiologist:   Dorothy Spark, MD   Chief complain: Dyspnea on exertion   History of Present Illness:  Brooke [name] is a 80 y.o. female who presents for evaluation of dyspnea on exertion. The patient states that for few years she has been feeling dyspneic on exertion. She has had stress that over 10 years ago that was negative. She underwent echocardiogram the last year that showed mild-to-moderate aortic insufficiency, otherwise normal study. Patient states that her dyspnea has been worsening in frequency now associated mild exertion and associated with chest pressure and pain in her jaw. She is his example working in her backyard would lead into profound dyspnea and fatigue forcing her to take rest. The patient is an medication for blood pressure vitamin D supplements and baby aspirin. She was told in the past that her cholesterol is okay and doesn't need to be addressed. She has a family history of coronary artery disease her father had myocardial infarction in his 63s.  12/10/14 - The patient had a negative stress test for ischemia, but hypertensive response to stress, BB was discontinued and she was started on amlodipine with significant improvement of symptoms but still feels SOB, unable to walk around the block. She was evaluated by pulmonary who believes that her PFTs were erroneously abnormal.   Past Medical History  Diagnosis Date  . HTN (hypertension)   . Osteopenia   . IBS (irritable bowel syndrome)   . Diverticulitis   . Chest pain     Past Surgical History  Procedure Laterality Date  . Cardiac catheterization    . Dilation and curettage of uterus       Current Outpatient Prescriptions  Medication Sig Dispense Refill  . amLODipine (NORVASC) 2.5 MG tablet  Take 1 tablet (2.5 mg total) by mouth daily. 180 tablet 3  . aspirin 81 MG tablet Take 81 mg by mouth daily.    . Calcium-Phosphorus-Vitamin D (CITRACAL +D3 PO) Take 1 tablet by mouth daily.    . Cholecalciferol (VITAMIN D-3) 1000 UNITS CAPS Take 1,000 Units by mouth daily.     . enalapril (VASOTEC) 10 MG tablet Take 10 mg by mouth daily.    . hydrochlorothiazide (MICROZIDE) 12.5 MG capsule Take 12.5 mg by mouth daily.    . Loperamide HCl (IMODIUM PO) Take 0.5 tablets by mouth as needed.    . Multiple Vitamins-Minerals (CENTRUM SILVER PO) Take 1 tablet by mouth daily.     No current facility-administered medications for this visit.    Allergies:   Macrobid and Sulfa antibiotics    Social History:  The patient  reports that she has never smoked. She does not have any smokeless tobacco history on file. She reports that she drinks alcohol. She reports that she does not use illicit drugs.   Family History:  The patient's family history includes Colon cancer in her mother; Heart attack in her father; Heart disease in her brother; Hypertension in her brother.   ROS:  Please see the history of present illness.   All other systems are reviewed and negative.   PHYSICAL EXAM: VS:  BP 134/62 mmHg  Pulse 83  Ht 5\' 7"  (1.702 m)  Wt 151 lb (68.493 kg)  BMI 23.64 kg/m2  SpO2 98% , BMI Body mass index is 23.64 kg/(m^2). GEN: Well nourished, well developed, in no acute distress HEENT: normal Neck: no JVD, carotid bruits, or masses Cardiac: RRR; 2/6 diastolic murmur rubs, or gallops,no edema  Respiratory:  clear to auscultation bilaterally, normal work of breathing GI: soft, nontender, nondistended, + BS MS: no deformity or atrophy Skin: warm and dry, no rash Neuro:  Strength and sensation are intact Psych: euthymic mood, full affect  EKG:  EKG is ordered today. The ekg ordered today demonstrates sinus rhythm, right atrial enlargement, borderline EKG  Recent Labs: No results found for  requested labs within last 365 days.   Lipid Panel No results found for: CHOL, TRIG, HDL, CHOLHDL, VLDL, LDLCALC, LDLDIRECT   Wt Readings from Last 3 Encounters:  12/10/14 151 lb (68.493 kg)  09/22/14 148 lb (67.132 kg)  09/08/14 151 lb (68.493 kg)    Echocardiogram performed in April 2015 Left ventricle: The cavity size was normal. There was mild concentric hypertrophy. Systolic function was normal. Wall motion was normal; there were no regional wall motion abnormalities. Doppler parameters are consistent with abnormal left ventricular relaxation (grade 1 diastolic dysfunction). - Aortic valve: Mild to moderate regurgitation.   ASSESSMENT AND PLAN:  80 year old female younger appearing  1. Exertional dyspnea - an exercise nuclear stress test showed poor exercise capacity, no infarct or ischemia but hypertensive response to stress. There is mild concentric LVH on echocardiogram. Significant improvement with amlodipine 2.5 mg PO daily, we will increase to 5 mg po daily. Follow up in 3 months.  Encourage to keep exercising.  2. Mild to moderate aortic insufficiency - add amlodipine, repeat echo in 2 years  3. Hypertension - add amlodipine  4. Hyperlipidemia - HDL 43, triglycerides 131 LDL 109,lifestyle modifications for now.  Follow up in 3 months.  Signed, Dorothy Spark, MD  12/10/2014 3:42 PM    [facility] [place], [place], [place]  [address] Phone: [phone]; Fax: [phone]

## 2015-03-22 ENCOUNTER — Encounter: Payer: Self-pay | Admitting: Cardiology

## 2015-03-22 ENCOUNTER — Ambulatory Visit (INDEPENDENT_AMBULATORY_CARE_PROVIDER_SITE_OTHER): Payer: Medicare Other | Admitting: Cardiology

## 2015-03-22 VITALS — BP 118/68 | HR 80 | Ht 68.5 in | Wt 152.4 lb

## 2015-03-22 DIAGNOSIS — I10 Essential (primary) hypertension: Secondary | ICD-10-CM | POA: Insufficient documentation

## 2015-03-22 DIAGNOSIS — R0600 Dyspnea, unspecified: Secondary | ICD-10-CM

## 2015-03-22 DIAGNOSIS — R0609 Other forms of dyspnea: Secondary | ICD-10-CM | POA: Diagnosis not present

## 2015-03-22 DIAGNOSIS — I351 Nonrheumatic aortic (valve) insufficiency: Secondary | ICD-10-CM | POA: Diagnosis not present

## 2015-03-22 DIAGNOSIS — I517 Cardiomegaly: Secondary | ICD-10-CM | POA: Diagnosis not present

## 2015-03-22 MED ORDER — AMLODIPINE BESYLATE 5 MG PO TABS: 5.0000 mg | ORAL_TABLET | Freq: Every day | ORAL | Status: DC

## 2015-03-22 NOTE — Progress Notes (Signed)
Patient ID: AMAHNI LEISEY, female   DOB: [birth year], 80 y.o.   MRN: [id number] Patient ID: PELIN ODOHERTY, female   DOB: [birth year], 80 y.o.   MRN: [id number]      Cardiology Office Note   Date:  03/22/2015   ID:  KALAYAH MAZZARESE, DOB 06-Nov-1934, MRN [id number]  PCP:  Jonathon Bellows, MD  Cardiologist:   Dorothy Spark, MD   Chief complain: Dyspnea on exertion   History of Present Illness:  RAMIAH MININNI is a 80 y.o. female who presents for evaluation of dyspnea on exertion. The patient states that for few years she has been feeling dyspneic on exertion. She has had stress that over 10 years ago that was negative. She underwent echocardiogram the last year that showed mild-to-moderate aortic insufficiency, otherwise normal study. Patient states that her dyspnea has been worsening in frequency now associated mild exertion and associated with chest pressure and pain in her jaw. She is his example working in her backyard would lead into profound dyspnea and fatigue forcing her to take rest. The patient is an medication for blood pressure vitamin D supplements and baby aspirin. She was told in the past that her cholesterol is okay and doesn't need to be addressed. She has a family history of coronary artery disease her father had myocardial infarction in his 34s.  03/22/2015 - The patient had a negative stress test for ischemia, but hypertensive response to stress, BB was discontinued and she was started on amlodipine with significant improvement of symptoms but because she had persistent symptoms increase amlodipine to further to 5 mg a day. This has led to significant improvement of symptoms and she only feels chest tightness when walking for flight of stairs at her In and it resolves within seconds after stopping exertion. She walks or goes to gym 3 times a week and is asymptomatic. She is compliant with her meds. Denies lower extremity edema, claudications, palpitations or fall.   Past  Medical History  Diagnosis Date  . HTN (hypertension)   . Osteopenia   . IBS (irritable bowel syndrome)   . Diverticulitis   . Chest pain     Past Surgical History  Procedure Laterality Date  . Cardiac catheterization    . Dilation and curettage of uterus       Current Outpatient Prescriptions  Medication Sig Dispense Refill  . amLODipine (NORVASC) 2.5 MG tablet Take 2.5 mg by mouth daily. Take 2 tablets once daily for a total of 5 mg.    . aspirin 81 MG tablet Take 81 mg by mouth daily.    . Calcium-Phosphorus-Vitamin D (CITRACAL +D3 PO) Take 1 tablet by mouth daily.    . Cholecalciferol (VITAMIN D-3) 1000 UNITS CAPS Take 1,000 Units by mouth daily.     . enalapril (VASOTEC) 10 MG tablet Take 10 mg by mouth daily.    . hydrochlorothiazide (MICROZIDE) 12.5 MG capsule Take 12.5 mg by mouth daily.    . Loperamide HCl (IMODIUM PO) Take 0.5 tablets by mouth as needed.    . Multiple Vitamins-Minerals (CENTRUM SILVER PO) Take 1 tablet by mouth daily.     No current facility-administered medications for this visit.    Allergies:   Macrobid and Sulfa antibiotics    Social History:  The patient  reports that she has never smoked. She does not have any smokeless tobacco history on file. She reports that she drinks alcohol. She reports that she does not use illicit drugs.  Family History:  The patient's family history includes Colon cancer in her mother; Heart attack in her father; Heart disease in her brother; Hypertension in her brother.   ROS:  Please see the history of present illness.   All other systems are reviewed and negative.   PHYSICAL EXAM: VS:  BP 118/68 mmHg  Pulse 80  Ht 5' 8.5" (1.74 m)  Wt 152 lb 6.4 oz (69.128 kg)  BMI 22.83 kg/m2 , BMI Body mass index is 22.83 kg/(m^2). GEN: Well nourished, well developed, in no acute distress HEENT: normal Neck: no JVD, carotid bruits, or masses Cardiac: RRR; 2/6 diastolic murmur rubs, or gallops,no edema  Respiratory:   clear to auscultation bilaterally, normal work of breathing GI: soft, nontender, nondistended, + BS MS: no deformity or atrophy Skin: warm and dry, no rash Neuro:  Strength and sensation are intact Psych: euthymic mood, full affect  EKG:  EKG is ordered today. The ekg ordered today demonstrates sinus rhythm, right atrial enlargement, borderline EKG  Recent Labs: No results found for requested labs within last 365 days.   Lipid Panel No results found for: CHOL, TRIG, HDL, CHOLHDL, VLDL, LDLCALC, LDLDIRECT   Wt Readings from Last 3 Encounters:  03/22/15 152 lb 6.4 oz (69.128 kg)  12/10/14 151 lb (68.493 kg)  09/22/14 148 lb (67.132 kg)    Echocardiogram performed in April 2015 Left ventricle: The cavity size was normal. There was mild concentric hypertrophy. Systolic function was normal. Wall motion was normal; there were no regional wall motion abnormalities. Doppler parameters are consistent with abnormal left ventricular relaxation (grade 1 diastolic dysfunction). - Aortic valve: Mild to moderate regurgitation.   ASSESSMENT AND PLAN:  80 year old female younger appearing  1. Exertional dyspnea - an exercise nuclear stress test showed poor exercise capacity, no infarct or ischemia but hypertensive response to stress. There is mild concentric LVH on echocardiogram. Significant improvement with amlodipine 5 mg PO daily, we will continue.  Encourage to keep exercising.  2. Mild to moderate aortic insufficiency - added amlodipine, repeat echo in 2 years  3. Hypertension - controlled with amlodipine.  4. Hyperlipidemia - HDL 43, triglycerides 131 LDL 109, lifestyle modifications for now.  Follow up in 6 months.  Signed, Dorothy Spark, MD  03/22/2015 10:24 AM    [facility] [place], [place], [place]  [address] Phone: [phone]; Fax: [phone]

## 2015-03-22 NOTE — Patient Instructions (Signed)
Your physician has recommended you make the following change in your medication:   START TAKING AMLODIPINE 5 MG ONCE DAILY     Your physician wants you to follow-up in: [place] will receive a reminder letter in the mail two months in advance. If you don't receive a letter, please call our office to schedule the follow-up appointment.

## 2016-04-12 ENCOUNTER — Other Ambulatory Visit: Payer: Self-pay | Admitting: Cardiology

## 2016-05-16 ENCOUNTER — Other Ambulatory Visit: Payer: Self-pay | Admitting: Cardiology

## 2016-05-22 ENCOUNTER — Other Ambulatory Visit: Payer: Self-pay | Admitting: *Deleted

## 2016-05-22 ENCOUNTER — Telehealth: Payer: Self-pay | Admitting: Cardiology

## 2016-05-22 MED ORDER — AMLODIPINE BESYLATE 5 MG PO TABS: 5.0000 mg | ORAL_TABLET | Freq: Every day | ORAL | 0 refills | Status: DC

## 2016-05-22 NOTE — Telephone Encounter (Signed)
Pt is past due to see Meda Coffee, was given 2 weeks of Amlodipine which will last until 2-17, appt made 2-28 and will need another refill to last until then at [place]

## 2016-06-02 ENCOUNTER — Encounter: Payer: Self-pay | Admitting: Cardiology

## 2016-06-04 IMAGING — NM NM MYOCAR MULTI W/ SPECT
3 series · 18 of 18 positions shown · non-contrast
Comparison: none

[Series 1: stress_(id)_sa · 6.5mm · 6.51mm/px · 6 of 64 frames shown (1 of 2)]
[frame 6/64]
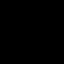
[frame 16/64]
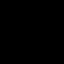
[frame 27/64]
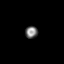
[frame 38/64]
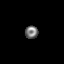
[frame 48/64]
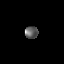
[frame 59/64]
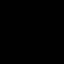

[Series 1: stress_(id)_sa · 6.5mm · 6.51mm/px · 6 of 512 frames shown (2 of 2)]
[frame 43/512]
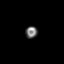
[frame 128/512]
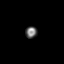
[frame 214/512]
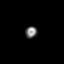
[frame 299/512]
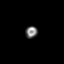
[frame 384/512]
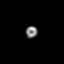
[frame 470/512]
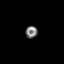

[Series 1: rest_(id)_sa · 6.5mm · 6.51mm/px · 6 of 64 frames shown]
[frame 6/64]
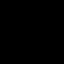
[frame 16/64]
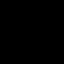
[frame 27/64]
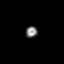
[frame 38/64]
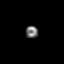
[frame 48/64]
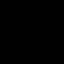
[frame 59/64]
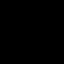

[18 of 18 positions shown; findings below may reference images not displayed]

Canned report from images found in remote index.

Refer to host system for actual result text.

## 2016-06-08 ENCOUNTER — Ambulatory Visit (INDEPENDENT_AMBULATORY_CARE_PROVIDER_SITE_OTHER): Payer: PPO | Admitting: Cardiology

## 2016-06-08 ENCOUNTER — Encounter (INDEPENDENT_AMBULATORY_CARE_PROVIDER_SITE_OTHER): Payer: Self-pay

## 2016-06-08 ENCOUNTER — Encounter: Payer: Self-pay | Admitting: Cardiology

## 2016-06-08 VITALS — BP 126/62 | HR 74 | Ht 68.5 in | Wt 157.0 lb

## 2016-06-08 DIAGNOSIS — E784 Other hyperlipidemia: Secondary | ICD-10-CM | POA: Diagnosis not present

## 2016-06-08 DIAGNOSIS — I119 Hypertensive heart disease without heart failure: Secondary | ICD-10-CM | POA: Diagnosis not present

## 2016-06-08 DIAGNOSIS — I351 Nonrheumatic aortic (valve) insufficiency: Secondary | ICD-10-CM | POA: Diagnosis not present

## 2016-06-08 DIAGNOSIS — E7849 Other hyperlipidemia: Secondary | ICD-10-CM

## 2016-06-08 NOTE — Progress Notes (Signed)
Patient ID: Brooke [name], female   DOB: [birth year], 82 y.o.   MRN: [id number]      Cardiology Office Note   Date:  06/08/2016   ID:  Brooke [name], DOB Dec 14, 1934, MRN [id number]  PCP:  Brooke Bellows, MD  Cardiologist:   Brooke Dawley, MD   Chief complain: Dyspnea on exertion   History of Present Illness:  Brooke [name] is a 82 y.o. female who presents for evaluation of dyspnea on exertion. The patient states that for few years she has been feeling dyspneic on exertion. She has had stress that over 10 years ago that was negative. She underwent echocardiogram the last year that showed mild-to-moderate aortic insufficiency, otherwise normal study. Patient states that her dyspnea has been worsening in frequency now associated mild exertion and associated with chest pressure and pain in her jaw. She is his example working in her backyard would lead into profound dyspnea and fatigue forcing her to take rest. The patient is an medication for blood pressure vitamin D supplements and baby aspirin. She was told in the past that her cholesterol is okay and doesn't need to be addressed. She has a family history of coronary artery disease her father had myocardial infarction in his 48s.  03/22/2015 - The patient had a negative stress test for ischemia, but hypertensive response to stress, BB was discontinued and she was started on amlodipine with significant improvement of symptoms but because she had persistent symptoms increase amlodipine to further to 5 mg a day. This has led to significant improvement of symptoms and she only feels chest tightness when walking for flight of stairs at her In and it resolves within seconds after stopping exertion. She walks or goes to gym 3 times a week and is asymptomatic. She is compliant with her meds. Denies lower extremity edema, claudications, palpitations or fall.  06/08/2016 - the patient is coming after one year, she states that she has had no chest pain,  but continues to have exertional dyspnea that is stable. In the interim she saw a pulmonary specialist Dr Brooke [name] who doesn't believe that he pulmonary function test was abnormal. She continues to be able to do all activities of daily living denies any dizziness palpitations or syncope. No lower extremity edema. She's been compliant with her medications.she describes orthostatic hypotension early in the morning but denies any falls.   Past Medical History:  Diagnosis Date  . Chest pain   . Diverticulitis   . HTN (hypertension)   . IBS (irritable bowel syndrome)   . Osteopenia     Past Surgical History:  Procedure Laterality Date  . CARDIAC CATHETERIZATION    . DILATION AND CURETTAGE OF UTERUS       Current Outpatient Prescriptions  Medication Sig Dispense Refill  . amLODipine (NORVASC) 5 MG tablet Take 1 tablet (5 mg total) by mouth daily. *Please keep 06/14/16 appointment for further refills* 30 tablet 0  . aspirin 81 MG tablet Take 81 mg by mouth daily.    . Calcium-Phosphorus-Vitamin D (CITRACAL +D3 PO) Take 1 tablet by mouth daily.    . Cholecalciferol (VITAMIN D-3) 1000 UNITS CAPS Take 1,000 Units by mouth daily.     . enalapril (VASOTEC) 10 MG tablet Take 10 mg by mouth daily.    . hydrochlorothiazide (MICROZIDE) 12.5 MG capsule Take 12.5 mg by mouth daily.    . Loperamide HCl (IMODIUM PO) Take 0.5 tablets by mouth as needed.    . Multiple Vitamins-Minerals (CENTRUM  SILVER PO) Take 1 tablet by mouth daily.     No current facility-administered medications for this visit.     Allergies:   Macrobid [nitrofurantoin monohyd macro] and Sulfa antibiotics    Social History:  The patient  reports that she has never smoked. She has never used smokeless tobacco. She reports that she drinks alcohol. She reports that she does not use drugs.   Family History:  The patient's family history includes Colon cancer in her mother; Heart attack in her father; Heart disease in her brother;  Hypertension in her brother.   ROS:  Please see the history of present illness.   All other systems are reviewed and negative.   PHYSICAL EXAM: VS:  BP 126/62   Pulse 74   Ht 5' 8.5" (1.74 m)   Wt 157 lb (71.2 kg)   BMI 23.52 kg/m  , BMI Body mass index is 23.52 kg/m. GEN: Well nourished, well developed, in no acute distress  HEENT: normal  Neck: no JVD, carotid bruits, or masses Cardiac: RRR; 2/6 diastolic murmur rubs, or gallops,no edema  Respiratory:  clear to auscultation bilaterally, normal work of breathing GI: soft, nontender, nondistended, + BS MS: no deformity or atrophy  Skin: warm and dry, no rash Neuro:  Strength and sensation are intact Psych: euthymic mood, full affect  EKG:  EKG is ordered today. The ekg ordered today demonstrates sinus rhythm, right atrial enlargement, borderline EKG  Recent Labs: No results found for requested labs within last 8760 hours.   Lipid Panel No results found for: CHOL, TRIG, HDL, CHOLHDL, VLDL, LDLCALC, LDLDIRECT   Wt Readings from Last 3 Encounters:  06/08/16 157 lb (71.2 kg)  03/22/15 152 lb 6.4 oz (69.1 kg)  12/10/14 151 lb (68.5 kg)    Echocardiogram performed in April 2015 Left ventricle: The cavity size was normal. There was mild concentric hypertrophy. Systolic function was normal. Wall motion was normal; there were no regional wall motion abnormalities. Doppler parameters are consistent with abnormal left ventricular relaxation (grade 1 diastolic dysfunction). - Aortic valve: Mild to moderate regurgitation.   ASSESSMENT AND PLAN:  82 year old female younger appearing younger appearing  1. Exertional dyspnea - an exercise nuclear stress test showed poor exercise capacity, no infarct or ischemia but hypertensive response to stress. There is mild concentric LVH on echocardiogram. Encourage to keep exercising.  2. Mild to moderate aortic insufficiency - added amlodipine, repeat echocardiogram now.  3. Hypertension - she  is experiencing orthostatic hypotension, however her blood pressure still tends to be on a higher and so continue the same management especially that she has aortic regurgitation.  4. Hyperlipidemia - HDL 43, triglycerides 131 LDL 109, lifestyle modifications for now.  Follow up in 12 months.  Signed, Brooke Dawley, MD  06/08/2016 11:20 AM    [facility], [place], [place]  [address] Phone: [phone]; Fax: [phone]

## 2016-06-08 NOTE — Patient Instructions (Signed)
Medication Instructions:   Your physician recommends that you continue on your current medications as directed. Please refer to the Current Medication list given to you today.    Testing/Procedures:  Your physician has requested that you have an echocardiogram. Echocardiography is a painless test that uses sound waves to create images of your heart. It provides your doctor with information about the size and shape of your heart and how well your heart's chambers and valves are working. This procedure takes approximately one hour. There are no restrictions for this procedure.     Follow-Up:  Your physician wants you to follow-up in: ONE YEAR WITH DR NELSON You will receive a reminder letter in the mail two months in advance. If you don't receive a letter, please call our office to schedule the follow-up appointment.        If you need a refill on your cardiac medications before your next appointment, please call your pharmacy.   

## 2016-06-14 ENCOUNTER — Ambulatory Visit: Payer: Medicare Other | Admitting: Cardiology

## 2016-06-26 ENCOUNTER — Other Ambulatory Visit: Payer: Self-pay

## 2016-06-26 ENCOUNTER — Ambulatory Visit (HOSPITAL_COMMUNITY): Payer: PPO | Attending: Cardiology

## 2016-06-26 DIAGNOSIS — I119 Hypertensive heart disease without heart failure: Secondary | ICD-10-CM | POA: Diagnosis not present

## 2016-06-26 DIAGNOSIS — I351 Nonrheumatic aortic (valve) insufficiency: Secondary | ICD-10-CM

## 2016-06-26 IMAGING — CR DG CHEST 2V
2 series · 2 of 2 positions shown · non-contrast
Comparison: None.

CLINICAL DATA: Soreness of breath for the past 3-4 weeks,
nonsmoker, abnormal pulmonary function studies

EXAM:
CHEST  2 VIEW

[view not recorded (1 of 2)]
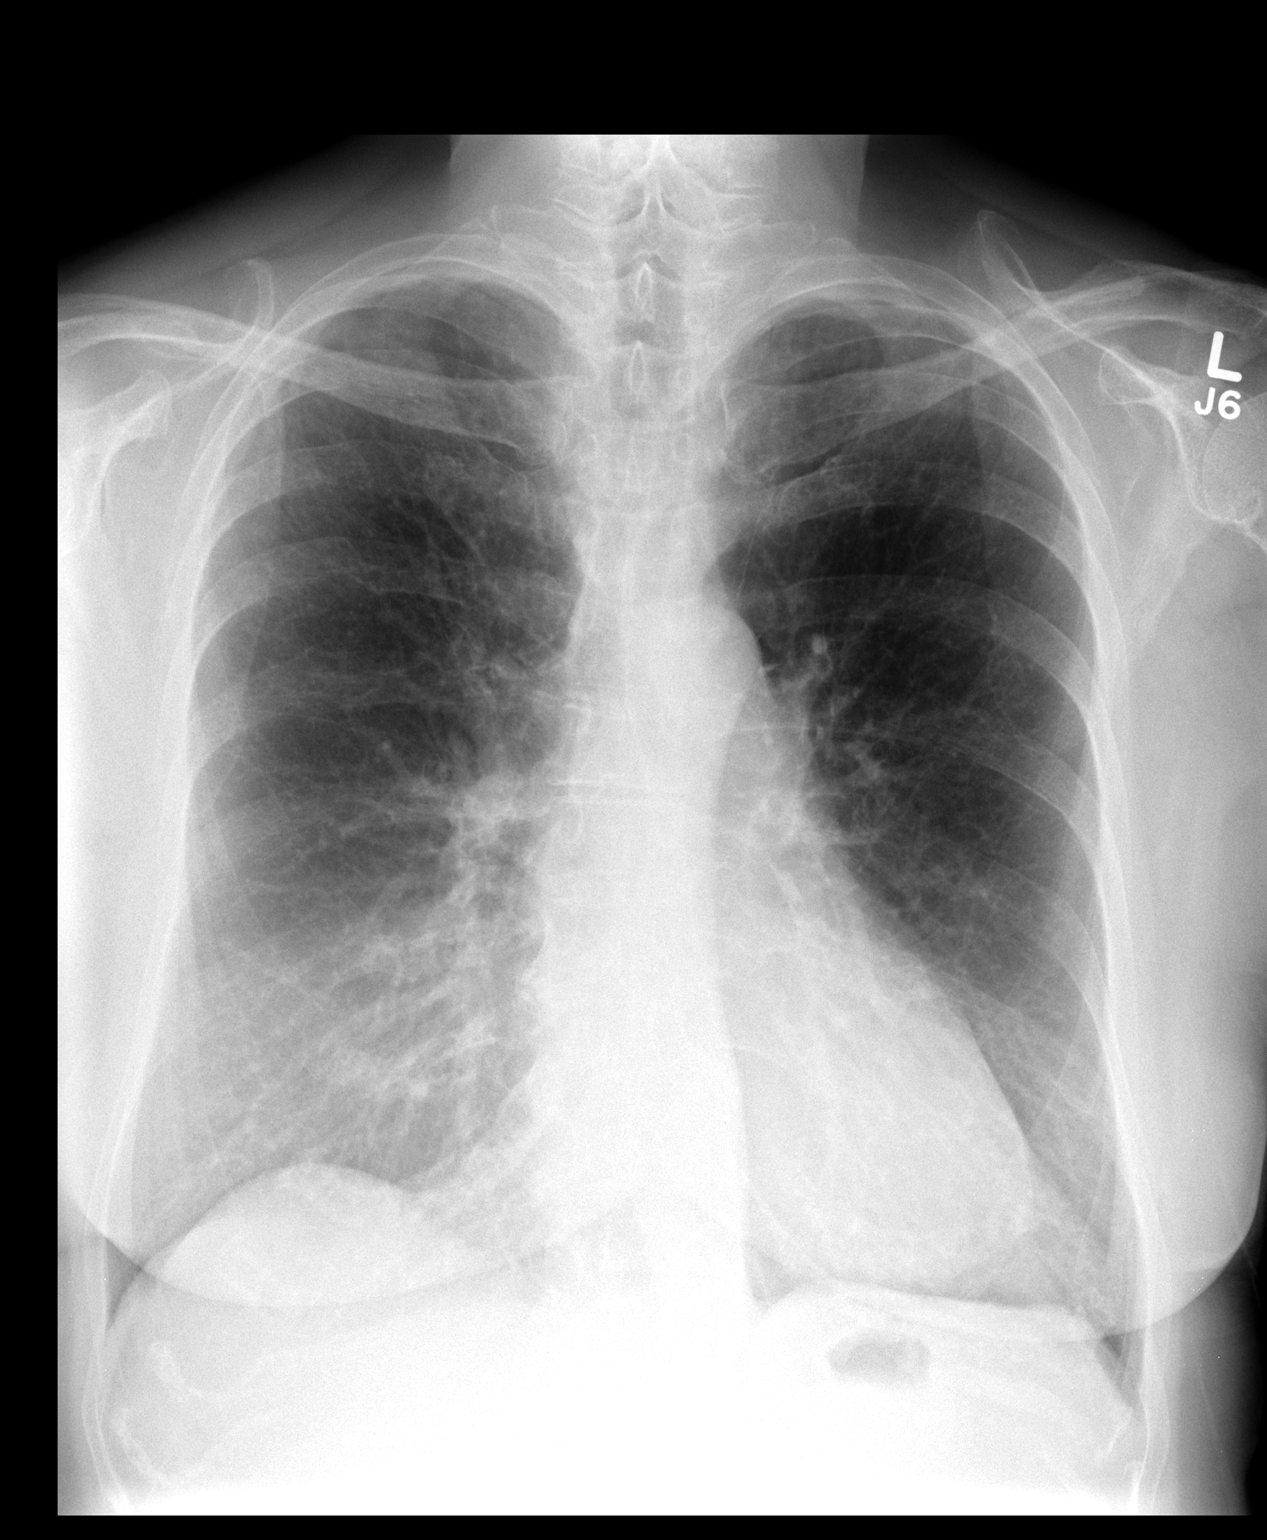

[view not recorded (2 of 2)]
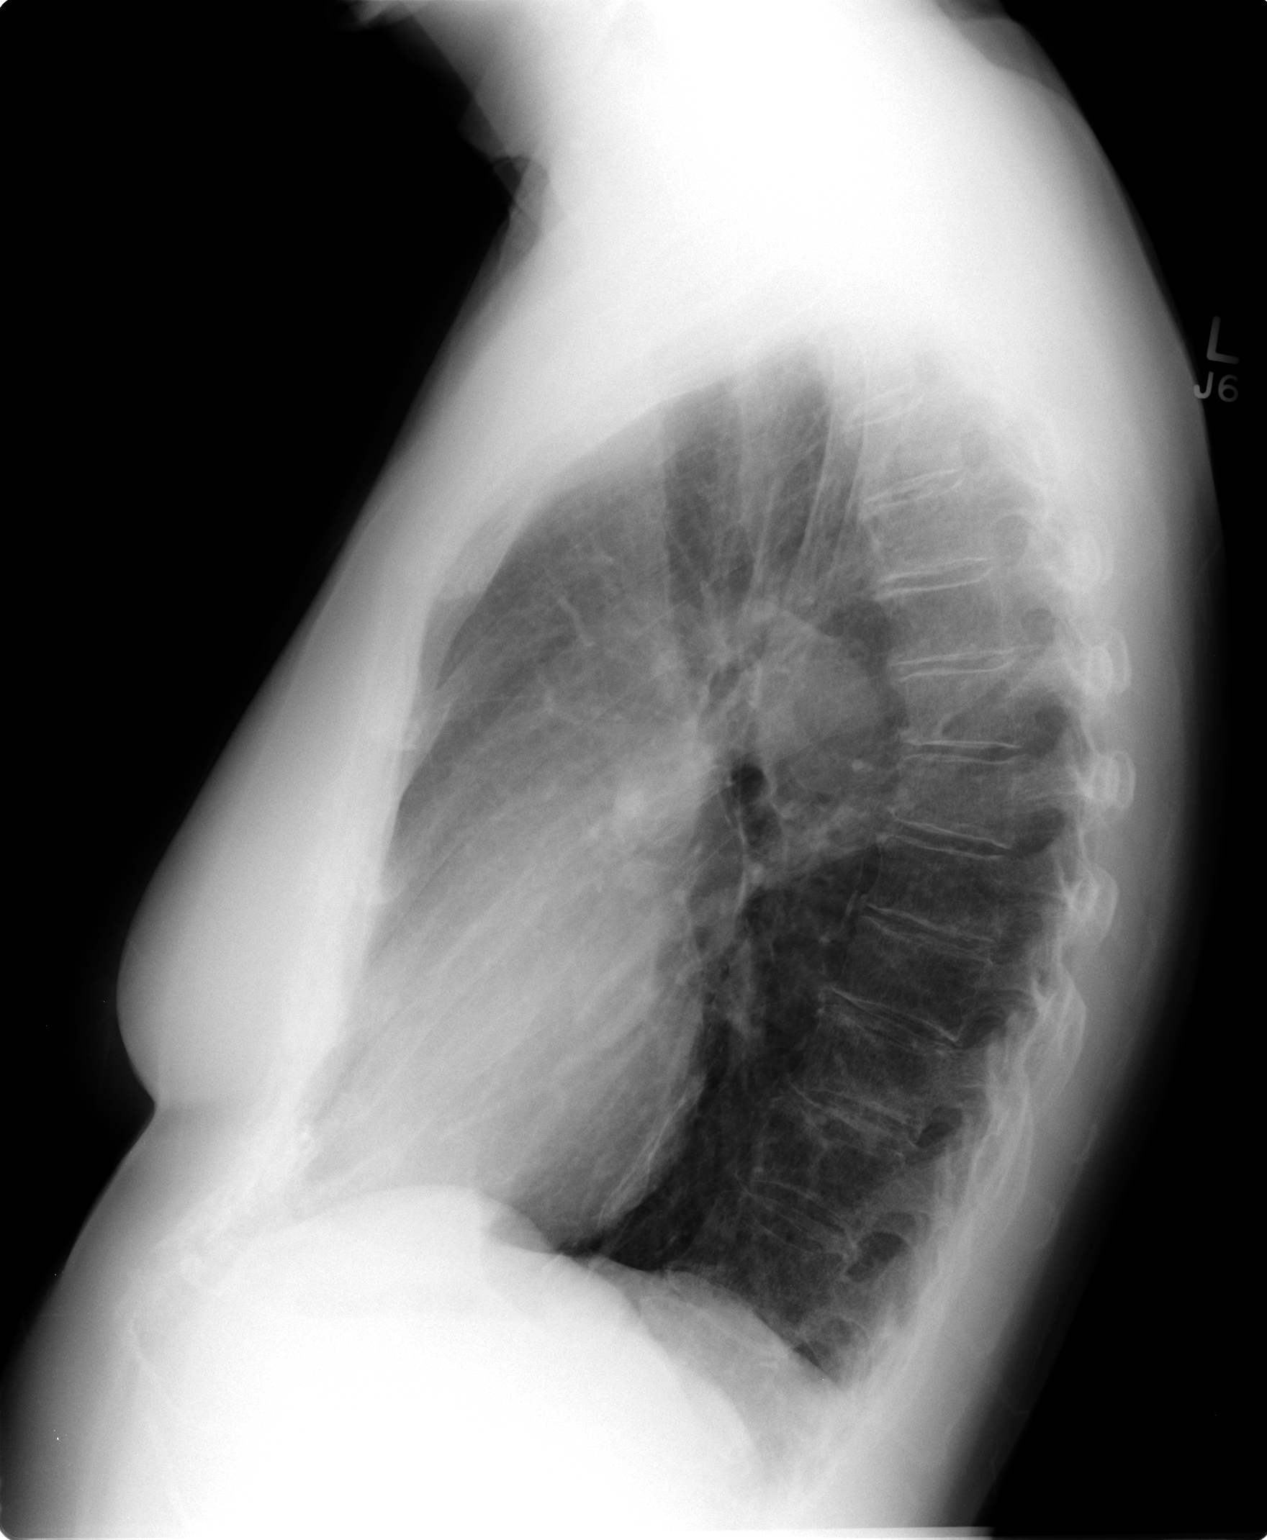

[2 of 2 positions shown; findings below may reference images not displayed]

FINDINGS: The lungs are hyperinflated with mild hemidiaphragm flattening.
There is hazy increased density in the right infrahilar region
without air bronchograms. The heart and pulmonary vascularity are
normal. The mediastinum is normal in width. There is no pleural
effusion. There is minimal apical pleural thickening bilaterally.
There is gentle dextrocurvature of the mid to lower thoracic spine.
The vertebral bodies are preserved in height.
IMPRESSION: COPD. There may be superimposed acute bronchitic changes. There is
no pneumonia nor CHF.

## 2016-06-29 ENCOUNTER — Other Ambulatory Visit: Payer: Self-pay

## 2016-06-29 MED ORDER — AMLODIPINE BESYLATE 5 MG PO TABS: 5.0000 mg | ORAL_TABLET | Freq: Every day | ORAL | 1 refills | Status: DC

## 2016-07-26 DIAGNOSIS — I519 Heart disease, unspecified: Secondary | ICD-10-CM | POA: Diagnosis not present

## 2016-07-26 DIAGNOSIS — I358 Other nonrheumatic aortic valve disorders: Secondary | ICD-10-CM | POA: Diagnosis not present

## 2016-07-26 DIAGNOSIS — I10 Essential (primary) hypertension: Secondary | ICD-10-CM | POA: Diagnosis not present

## 2016-07-26 DIAGNOSIS — I517 Cardiomegaly: Secondary | ICD-10-CM | POA: Diagnosis not present

## 2016-07-26 DIAGNOSIS — E782 Mixed hyperlipidemia: Secondary | ICD-10-CM | POA: Diagnosis not present

## 2016-07-26 DIAGNOSIS — Z0000 Encounter for general adult medical examination without abnormal findings: Secondary | ICD-10-CM | POA: Diagnosis not present

## 2016-07-26 DIAGNOSIS — I351 Nonrheumatic aortic (valve) insufficiency: Secondary | ICD-10-CM | POA: Diagnosis not present

## 2016-08-11 ENCOUNTER — Other Ambulatory Visit: Payer: Self-pay | Admitting: Cardiology

## 2016-08-11 MED ORDER — AMLODIPINE BESYLATE 5 MG PO TABS: 5.0000 mg | ORAL_TABLET | Freq: Every day | ORAL | 1 refills | Status: DC

## 2016-08-15 ENCOUNTER — Telehealth (HOSPITAL_COMMUNITY): Payer: Self-pay | Admitting: *Deleted

## 2016-08-15 ENCOUNTER — Telehealth (HOSPITAL_COMMUNITY): Payer: Self-pay | Admitting: Family Medicine

## 2016-08-15 NOTE — Telephone Encounter (Signed)
-----   Message from Dorothy Spark, MD sent at 08/14/2016  6:16 PM EDT ----- Regarding: RE: Cardiac rehab I thought that it wouldn't be covered, but the patient wanted to try. Would you be able to call her and explain the cost? Thank you, Houston Siren  ----- Message ----- From: Rowe Pavy, RN Sent: 08/14/2016   3:31 PM To: Dorothy Spark, MD Subject: Cardiac rehab                                  Dr. Meda Coffee  Received referral from you for the above pt.  The diagnosis listed is aortic insufficiency and deconditioning.  These are not a covered diagnosis for medicare plans.  Are you thinking maintenance - medically supervised and non telemetry self pay (cost of 68.00)? Looks like you saw her in the office in February.  Pt had eco in March.  Thanks for your advisement Maurice Small RN, BSN Cardiac and Pulmonary Rehab Nurse Navigator

## 2016-08-15 NOTE — Telephone Encounter (Signed)
I called and spoke with patient about paper referral we received for her to participate in the Cardiac Rehab program. Per Carlette, I informed patient that we would not be able to see her our monitored Cardiac Rehab program, due to the dx and insurance would not cover this. Patient offered the maintenance program (self pay) $68.00 a month. Patient wanted to think about it and call back. I gave patient the contact person name - Olinty to call back to speak to if she wants to do the maintenance program.

## 2016-09-26 DIAGNOSIS — Z800 Family history of malignant neoplasm of digestive organs: Secondary | ICD-10-CM | POA: Diagnosis not present

## 2016-09-26 DIAGNOSIS — Z1211 Encounter for screening for malignant neoplasm of colon: Secondary | ICD-10-CM | POA: Diagnosis not present

## 2017-01-25 DIAGNOSIS — L821 Other seborrheic keratosis: Secondary | ICD-10-CM | POA: Diagnosis not present

## 2017-01-25 DIAGNOSIS — L814 Other melanin hyperpigmentation: Secondary | ICD-10-CM | POA: Diagnosis not present

## 2017-01-25 DIAGNOSIS — D692 Other nonthrombocytopenic purpura: Secondary | ICD-10-CM | POA: Diagnosis not present

## 2017-01-25 DIAGNOSIS — D1801 Hemangioma of skin and subcutaneous tissue: Secondary | ICD-10-CM | POA: Diagnosis not present

## 2017-01-25 DIAGNOSIS — L570 Actinic keratosis: Secondary | ICD-10-CM | POA: Diagnosis not present

## 2017-01-25 DIAGNOSIS — D2272 Melanocytic nevi of left lower limb, including hip: Secondary | ICD-10-CM | POA: Diagnosis not present

## 2017-02-22 DIAGNOSIS — Z23 Encounter for immunization: Secondary | ICD-10-CM | POA: Diagnosis not present

## 2017-05-21 DIAGNOSIS — H2513 Age-related nuclear cataract, bilateral: Secondary | ICD-10-CM | POA: Diagnosis not present

## 2017-05-22 DIAGNOSIS — H903 Sensorineural hearing loss, bilateral: Secondary | ICD-10-CM | POA: Diagnosis not present

## 2017-05-24 DIAGNOSIS — H25812 Combined forms of age-related cataract, left eye: Secondary | ICD-10-CM | POA: Diagnosis not present

## 2017-05-24 DIAGNOSIS — H2512 Age-related nuclear cataract, left eye: Secondary | ICD-10-CM | POA: Diagnosis not present

## 2017-06-11 DIAGNOSIS — H903 Sensorineural hearing loss, bilateral: Secondary | ICD-10-CM | POA: Diagnosis not present

## 2017-06-12 ENCOUNTER — Ambulatory Visit: Payer: PPO | Admitting: Cardiology

## 2017-06-12 ENCOUNTER — Encounter: Payer: Self-pay | Admitting: Cardiology

## 2017-06-12 VITALS — BP 130/68 | HR 90 | Ht 68.5 in | Wt 148.0 lb

## 2017-06-12 DIAGNOSIS — E7849 Other hyperlipidemia: Secondary | ICD-10-CM | POA: Diagnosis not present

## 2017-06-12 DIAGNOSIS — I119 Hypertensive heart disease without heart failure: Secondary | ICD-10-CM

## 2017-06-12 DIAGNOSIS — I351 Nonrheumatic aortic (valve) insufficiency: Secondary | ICD-10-CM

## 2017-06-12 NOTE — Progress Notes (Signed)
Patient ID: Brooke [name], female   DOB: 02-27-1935, 83 y.o.   MRN: [id number]      Cardiology Office Note   Date:  06/12/2017   ID:  Brooke [name], DOB [birth year], MRN [id number]  PCP:  Maurice Small, MD  Cardiologist:   Ena Dawley, MD   Chief complain: Dyspnea on exertion   History of Present Illness:  Brooke [name] is a 83 y.o. female who presents for evaluation of dyspnea on exertion. The patient states that for few years she has been feeling dyspneic on exertion. She has had stress that over 10 years ago that was negative. She underwent echocardiogram the last year that showed mild-to-moderate aortic insufficiency, otherwise normal study. Patient states that her dyspnea has been worsening in frequency now associated mild exertion and associated with chest pressure and pain in her jaw. She is his example working in her backyard would lead into profound dyspnea and fatigue forcing her to take rest. The patient is an medication for blood pressure vitamin D supplements and baby aspirin. She was told in the past that her cholesterol is okay and doesn't need to be addressed. She has a family history of coronary artery disease her father had myocardial infarction in his 69s.  03/22/2015 - The patient had a negative stress test for ischemia, but hypertensive response to stress, BB was discontinued and she was started on amlodipine with significant improvement of symptoms but because she had persistent symptoms increase amlodipine to further to 5 mg a day. This has led to significant improvement of symptoms and she only feels chest tightness when walking for flight of stairs at her In and it resolves within seconds after stopping exertion. She walks or goes to gym 3 times a week and is asymptomatic. She is compliant with her meds. Denies lower extremity edema, claudications, palpitations or fall.  06/08/2016 - the patient is coming after one year, she states that she has had no chest pain, but  continues to have exertional dyspnea that is stable. In the interim she saw a pulmonary specialist Dr Lamonte Sakai who doesn't believe that he pulmonary function test was abnormal. She continues to be able to do all activities of daily living denies any dizziness palpitations or syncope. No lower extremity edema. She's been compliant with her medications.she describes orthostatic hypotension early in the morning but denies any falls.  06/12/2017 - one year follow-up, the patient is coming for regular follow-up, she's been doing great, she was able to lose 10 pounds in the last year with exercise. She denies any chest pain shortness of breath palpitation claudications or lower extremity edema. She is preparing for a long trip to keep down in [place] and asking if she should use compression stockings. She states it when she gets up after prolonged sitting she gets dizzy but there are no falls or syncope.   Past Medical History:  Diagnosis Date  . Chest pain   . Diverticulitis   . HTN (hypertension)   . IBS (irritable bowel syndrome)   . Osteopenia     Current Outpatient Medications  Medication Sig Dispense Refill  . amLODipine (NORVASC) 5 MG tablet Take 1 tablet (5 mg total) by mouth daily. 90 tablet 1  . aspirin 81 MG tablet Take 81 mg by mouth daily.    . Calcium-Phosphorus-Vitamin D (CITRACAL +D3 PO) Take 1 tablet by mouth daily.    . Cholecalciferol (VITAMIN D-3) 1000 UNITS CAPS Take 1,000 Units by mouth daily.     Marland Kitchen  DUREZOL 0.05 % EMUL INSTILL 1 DROP INTO LEFT EYE 4 TIMES A DAY  0  . enalapril (VASOTEC) 20 MG tablet TAKE 1 TABLET BY MOUTH EVERY DAY    . hydrochlorothiazide (MICROZIDE) 12.5 MG capsule Take 12.5 mg by mouth daily.    . Loperamide HCl (IMODIUM PO) Take 0.5 tablets by mouth as needed.    . moxifloxacin (VIGAMOX) 0.5 % ophthalmic solution PUT 1 DROP IN EYE(S) 4 TIMES A DAY STARTING 2 DAYS PRIOR TO SURGERY &2 DROPS MORNING OF SURGERY  0  . Multiple Vitamins-Minerals (CENTRUM  SILVER PO) Take 1 tablet by mouth daily.     No current facility-administered medications for this visit.     Allergies:   Macrobid [nitrofurantoin monohyd macro] and Sulfa antibiotics    Social History:  The patient  reports that  has never smoked. she has never used smokeless tobacco. She reports that she drinks alcohol. She reports that she does not use drugs.   Family History:  The patient's family history includes Colon cancer in her mother; Heart attack in her father; Heart disease in her brother; Hypertension in her brother.   ROS:  Please see the history of present illness.   All other systems are reviewed and negative.   PHYSICAL EXAM: VS:  BP 130/68 (BP Location: Left Arm, Patient Position: Sitting, Cuff Size: Normal)   Pulse 90   Ht 5' 8.5" (1.74 m)   Wt 148 lb (67.1 kg)   SpO2 96%   BMI 22.18 kg/m  , BMI Body mass index is 22.18 kg/m. GEN: Well nourished, well developed, in no acute distress  HEENT: normal  Neck: no JVD, carotid bruits, or masses Cardiac: RRR; 2/6 diastolic murmur rubs, or gallops,no edema  Respiratory:  clear to auscultation bilaterally, normal work of breathing GI: soft, nontender, nondistended, + BS MS: no deformity or atrophy  Skin: warm and dry, no rash Neuro:  Strength and sensation are intact Psych: euthymic mood, full affect  EKG:  EKG is ordered today. The ekg ordered today demonstrates sinus rhythm, right atrial enlargement, borderline EKG  Recent Labs: No results found for requested labs within last 8760 hours.   Lipid Panel No results found for: CHOL, TRIG, HDL, CHOLHDL, VLDL, LDLCALC, LDLDIRECT   Wt Readings from Last 3 Encounters:  06/12/17 148 lb (67.1 kg)  06/08/16 157 lb (71.2 kg)  03/22/15 152 lb 6.4 oz (69.1 kg)    Echocardiogram performed in April 2015 Left ventricle: The cavity size was normal. There was mild concentric hypertrophy. Systolic function was normal. Wall motion was normal; there were no regional wall  motion abnormalities. Doppler parameters are consistent with abnormal left ventricular relaxation (grade 1 diastolic dysfunction). - Aortic valve: Mild to moderate regurgitation.  EKG performed today 06/12/2017 shows normal sinus rhythm normal EKG unchanged from prior.   ASSESSMENT AND PLAN:  1. Exertional dyspnea - an exercise nuclear stress test showed poor exercise capacity, no infarct or ischemia but hypertensive response to stress. There is mild concentric LVH on echocardiogram. Encourage to keep exercising. Her symptoms have completely resolved with exercise.  2. Mild aortic insufficiency - on most recent echocardiogram in March 2018.  3. Hypertension - she is experiencing orthostatic hypotension, we will hold hydrochlorothiazide as she is already on very low-dose 12.5 use it just as needed.  4. Hyperlipidemia - HDL 43, triglycerides 131 LDL 109, lifestyle modifications for now. We will obtain most recent labs from her primary care physician.  5. Prolonged travel, she is  less juice compression socks and ankle pumps to avoid DVT.  Follow up in 12 months.  Signed, Ena Dawley, MD  06/12/2017 10:54 AM    [facility], [place], [place]  [address] Phone: [phone]; Fax: [phone]

## 2017-06-12 NOTE — Patient Instructions (Signed)

## 2017-07-17 ENCOUNTER — Other Ambulatory Visit: Payer: Self-pay | Admitting: Cardiology

## 2017-07-26 DIAGNOSIS — H25811 Combined forms of age-related cataract, right eye: Secondary | ICD-10-CM | POA: Diagnosis not present

## 2017-07-26 DIAGNOSIS — H2511 Age-related nuclear cataract, right eye: Secondary | ICD-10-CM | POA: Diagnosis not present

## 2017-07-26 DIAGNOSIS — H52201 Unspecified astigmatism, right eye: Secondary | ICD-10-CM | POA: Diagnosis not present

## 2017-08-08 DIAGNOSIS — I519 Heart disease, unspecified: Secondary | ICD-10-CM | POA: Diagnosis not present

## 2017-08-08 DIAGNOSIS — R011 Cardiac murmur, unspecified: Secondary | ICD-10-CM | POA: Diagnosis not present

## 2017-08-08 DIAGNOSIS — Z0000 Encounter for general adult medical examination without abnormal findings: Secondary | ICD-10-CM | POA: Diagnosis not present

## 2017-08-08 DIAGNOSIS — I358 Other nonrheumatic aortic valve disorders: Secondary | ICD-10-CM | POA: Diagnosis not present

## 2017-08-08 DIAGNOSIS — I10 Essential (primary) hypertension: Secondary | ICD-10-CM | POA: Diagnosis not present

## 2017-08-08 DIAGNOSIS — I351 Nonrheumatic aortic (valve) insufficiency: Secondary | ICD-10-CM | POA: Diagnosis not present

## 2017-08-08 DIAGNOSIS — E782 Mixed hyperlipidemia: Secondary | ICD-10-CM | POA: Diagnosis not present

## 2017-08-09 ENCOUNTER — Telehealth: Payer: Self-pay | Admitting: Cardiology

## 2017-08-09 NOTE — Telephone Encounter (Signed)
Pt is calling to make an appt with Dr Meda Coffee or an APP within the week for intermittent chest pain x 3 weeks. Pt states that its not consistent chest pain. Her pain is mid-sternal in nature.  Pt mostly reports her pain is more so when she is exertional. Pt states she has no sob, doe, palpitations, dizziness, NV, pre-syncopal or syncopal episodes at this time.  Pt went to see her PCP yesterday, and had a good report.  PCP did advise that she get into see Cards, within the next week or so, for on-going cp. Scheduled the pt to see Robbie Lis PA-C for next Friday 5/3 at 1130.  Pt aware to arrive 15 mins prior to this appt.  Advised the pt that if her symptoms worsen, if her CP is persistent and unrelieved, or she develops worsening sob, dizziness, NV, pre-syncopal or syncopal episodes, then she should refer immediately to the ER for further evaluation.  Informed the pt that Dr Meda Coffee is out of the office this week, but I will route this message to her for her review.  Pt verbalized understanding and agrees with this plan.

## 2017-08-09 NOTE — Telephone Encounter (Signed)
New message    Pt c/o of Chest Pain: STAT if CP now or developed within 24 hours  1. Are you having CP right now? NO  2. Are you experiencing any other symptoms (ex. SOB, nausea, vomiting, sweating)? SOB on exertion   3. How long have you been experiencing CP? SINCE February AT LEAST   4. Is your CP continuous or coming and going? COMING AND GOING  5. Have you taken Nitroglycerin?NO ?

## 2017-08-16 NOTE — Progress Notes (Signed)
Cardiology Office Note    Date:  08/17/2017   ID:  JYRAH BLYE, DOB [birth year], MRN [id number]  PCP:  Maurice Small, MD  Cardiologist:  Dr. Meda Coffee   Chief Complaint: Chest pain   History of Present Illness:   Brooke HACKWORTH is a 83 y.o. female with hx of HTN, HLD, mild AI and chronic exertional dyspnea presents for chest pain evaluation.   Exercise nuclear stress test in 2016 showed poor exercise capacity, no infarct or ischemia but hypertensive response to stress. Her exertional dyspnea has improved with exercise.  Last echo 06/2016 showed LVEF of 65-70% with grade 1 DD, mild AI.   She was doing well on cardiac stand point when last seen by Dr. Meda Coffee 06/12/17.  The patient is added to my schedule for evaluation of substernal chest pressure radiating bilaterally underneath her breast.  Associated with diaphoresis.  No nausea or shortness of breath.  It occurs with doing yard work or walking on stairs.  However, this been ongoing for years.  Yesterday she had an episode first time at rest.  She took Tums and her symptoms resolved within 30 minutes.  No recurrence since then.  Denies orthopnea, PND, syncope, lower extremity edema or melena.  Past Medical History:  Diagnosis Date  . Chest pain   . Diverticulitis   . HTN (hypertension)   . IBS (irritable bowel syndrome)   . Osteopenia     Past Surgical History:  Procedure Laterality Date  . CARDIAC CATHETERIZATION    . DILATION AND CURETTAGE OF UTERUS      Current Medications: Prior to Admission medications   Medication Sig Start Date End Date Taking? Authorizing Provider  amLODipine (NORVASC) 5 MG tablet TAKE 1 TABLET (5 MG TOTAL) BY MOUTH DAILY. 07/19/17   Dorothy Spark, MD  aspirin 81 MG tablet Take 81 mg by mouth daily.    [provider]  Calcium-Phosphorus-Vitamin D (CITRACAL +D3 PO) Take 1 tablet by mouth daily.    [provider]  Cholecalciferol (VITAMIN D-3) 1000 UNITS CAPS Take 1,000 Units by  mouth daily.     [provider]  DUREZOL 0.05 % EMUL INSTILL 1 DROP INTO LEFT EYE 4 TIMES A DAY 05/24/17   [provider]  enalapril (VASOTEC) 20 MG tablet TAKE 1 TABLET BY MOUTH EVERY DAY 04/01/17   [provider]  hydrochlorothiazide (MICROZIDE) 12.5 MG capsule Take 12.5 mg by mouth daily.    [provider]  Loperamide HCl (IMODIUM PO) Take 0.5 tablets by mouth as needed.    [provider]  moxifloxacin (VIGAMOX) 0.5 % ophthalmic solution PUT 1 DROP IN EYE(S) 4 TIMES A DAY STARTING 2 DAYS PRIOR TO SURGERY &2 DROPS MORNING OF SURGERY 05/24/17   [provider]  Multiple Vitamins-Minerals (CENTRUM SILVER PO) Take 1 tablet by mouth daily.    [provider]    Allergies:   Macrobid [nitrofurantoin monohyd macro] and Sulfa antibiotics   Social History   Socioeconomic History  . Marital status: Married    Spouse name: Not on file  . Number of children: Not on file  . Years of education: Not on file  . Highest education level: Not on file  Occupational History  . Not on file  Social Needs  . Financial resource strain: Not on file  . Food insecurity:    Worry: Not on file    Inability: Not on file  . Transportation needs:    Medical: Not on  file    Non-medical: Not on file  Tobacco Use  . Smoking status: Never Smoker  . Smokeless tobacco: Never Used  Substance and Sexual Activity  . Alcohol use: Yes    Alcohol/week: 0.0 oz  . Drug use: No  . Sexual activity: Not on file  Lifestyle  . Physical activity:    Days per week: Not on file    Minutes per session: Not on file  . Stress: Not on file  Relationships  . Social connections:    Talks on phone: Not on file    Gets together: Not on file    Attends religious service: Not on file    Active member of club or organization: Not on file    Attends meetings of clubs or organizations: Not on file    Relationship status: Not on file  Other Topics Concern  . Not on  file  Social History Narrative  . Not on file     Family History:  The patient's family history includes Colon cancer in her mother; Heart attack in her father; Heart disease in her brother; Hypertension in her brother.  ROS:   Please see the history of present illness.    ROS All other systems reviewed and are negative.   PHYSICAL EXAM:   VS:  BP 126/62   Pulse 70   Ht 5\' 7"  (1.702 m)   Wt 152 lb (68.9 kg)   BMI 23.81 kg/m    GEN: Well nourished, well developed, in no acute distress  HEENT: normal  Neck: no JVD, carotid bruits, or masses Cardiac: RRR; no murmurs, rubs, or gallops,no edema  Respiratory:  clear to auscultation bilaterally, normal work of breathing GI: soft, nontender, nondistended, + BS MS: no deformity or atrophy  Skin: warm and dry, no rash Neuro:  Alert and Oriented x 3, Strength and sensation are intact Psych: euthymic mood, full affect  Wt Readings from Last 3 Encounters:  08/17/17 152 lb (68.9 kg)  06/12/17 148 lb (67.1 kg)  06/08/16 157 lb (71.2 kg)    Studies/Labs Reviewed:   EKG:  EKG is ordered today.  The ekg ordered today demonstrates normal sinus rhythm  Recent Labs: No results found for requested labs within last 8760 hours.   Lipid Panel No results found for: CHOL, TRIG, HDL, CHOLHDL, VLDL, LDLCALC, LDLDIRECT  Additional studies/ records that were reviewed today include:   Echocardiogram: 06/2016 Study Conclusions  - Left ventricle: The cavity size was normal. Wall thickness was   increased in a pattern of mild LVH. Systolic function was   vigorous. The estimated ejection fraction was in the range of 65%   to 70%. Wall motion was normal; there were no regional wall   motion abnormalities. Doppler parameters are consistent with   abnormal left ventricular relaxation (grade 1 diastolic   dysfunction). Doppler parameters are consistent with high   ventricular filling pressure. - Aortic valve: There was mild regurgitation. - Left  atrium: The atrium was mildly dilated.  Impressions:  - Vigorous LV systolic function; grade 1 diastolic dysfunction with   elevated LV filling pressure; mild LVH; mild LAE; mild AI; mildly   elevated LVOT velocity (2 m/s) likely related to vigorous LV   function.   ASSESSMENT & PLAN:    1. Chest pressure -Mixed symptoms.  Ongoing for many years.  She does not think its recently exacerbated.  Sternal pressure radiating underneath breasts bilaterally.  Associated with diaphoresis.  Symptoms occurred at rest first time  yesterday but that resolved with Tums. -She does have history of acid reflux but typically does not require any medication. -Offered PPI however patient declined.  However, she is willing to try over-the-counter Zantac.  Will get coronary CTA. -Continue aspirin.  2.  Hypertension -Blood pressure stable on current medication.  3.  Hyperlipidemia -LDL 99.  Not on any medication.  If evidence of CAD will consider statin.    Medication Adjustments/Labs and Tests Ordered: Current medicines are reviewed at length with the patient today.  Concerns regarding medicines are outlined above.  Medication changes, Labs and Tests ordered today are listed in the Patient Instructions below. Patient Instructions  Medication Instructions:   START TAKING NITROGLYCERIN 0.4 SUBLINGUAL TABLET AS NEEDED FOR CHEST PAIN  TAKE OVER THERE COUNTER ZANTAC FOR REFLUX  If you need a refill on your cardiac medications before your next appointment, please call your pharmacy.  Labwork: BMET ONE WEEK PRIOR  TO CT TEST     Testing/Procedures: Non-Cardiac CT Angiography (CTA), is a special type of CT scan that uses a computer to produce multi-dimensional views of major blood vessels throughout the body. In CT angiography, a contrast material is injected through an IV to help visualize the blood vessels    Follow-Up:    AS SCHEDULED  WITH DR Meda Coffee     Any Other Special Instructions Will Be  Listed Below (If Applicable).   Please arrive at the [facility] main entrance of [facility] at [place]  (30-45 minutes prior to test start time)  [facility] [place], [place] [address] [phone]  Proceed to the [facility] Radiology Department (First Floor).  Please follow these instructions carefully (unless otherwise directed):  Hold all erectile dysfunction medications at least 48 hours prior to test.  On the Night Before the Test: . Drink plenty of water. . Do not consume any caffeinated/decaffeinated beverages or chocolate 12 hours prior to your test. . Do not take any antihistamines 12 hours prior to your test. . If you take Metformin do not take 24 hours prior to test. . If the patient has contrast allergy: ? Patient will need a prescription for Prednisone and very clear instructions (as follows): 1. Prednisone 50 mg - take 13 hours prior to test 2. Take another Prednisone 50 mg 7 hours prior to test 3. Take another Prednisone 50 mg 1 hour prior to test 4. Take Benadryl 50 mg 1 hour prior to test . Patient must complete all four doses of above prophylactic medications. . Patient will need a ride after test due to Benadryl.  On the Day of the Test: . Drink plenty of water. Do not drink any water within one hour of the test. . Do not eat any food 4 hours prior to the test. . You may take your regular medications prior to the test. . IF NOT ON A BETA BLOCKER - Take 50 mg of lopressor (metoprolol) one hour before the test. . HOLD Furosemide morning of the test.  After the Test: . Drink plenty of water. . After receiving IV contrast, you may experience a mild flushed feeling. This is normal. . On occasion, you may experience a mild rash up to 24 hours after the test. This is not dangerous. If this occurs, you can take Benadryl 25 mg and increase your fluid intake. . If you experience trouble breathing, this can be serious. If  it is severe call 911 IMMEDIATELY. If it is mild,  please call our office. . If you take any of these medications: Glipizide/Metformin, Avandament, Glucavance, please do not take 48 hours after completing test.                                                                                                                                                     Jarrett Soho, PA  08/17/2017 11:59 AM    [address], [place], [place]  [address] Phone: [phone]; Fax: [phone]

## 2017-08-17 ENCOUNTER — Encounter (INDEPENDENT_AMBULATORY_CARE_PROVIDER_SITE_OTHER): Payer: Self-pay

## 2017-08-17 ENCOUNTER — Encounter: Payer: Self-pay | Admitting: Physician Assistant

## 2017-08-17 ENCOUNTER — Ambulatory Visit: Payer: PPO | Admitting: Physician Assistant

## 2017-08-17 VITALS — BP 126/62 | HR 70 | Ht 67.0 in | Wt 152.0 lb

## 2017-08-17 DIAGNOSIS — R079 Chest pain, unspecified: Secondary | ICD-10-CM

## 2017-08-17 DIAGNOSIS — I119 Hypertensive heart disease without heart failure: Secondary | ICD-10-CM | POA: Diagnosis not present

## 2017-08-17 DIAGNOSIS — E7849 Other hyperlipidemia: Secondary | ICD-10-CM | POA: Diagnosis not present

## 2017-08-17 DIAGNOSIS — I351 Nonrheumatic aortic (valve) insufficiency: Secondary | ICD-10-CM | POA: Diagnosis not present

## 2017-08-17 MED ORDER — METOPROLOL TARTRATE 50 MG PO TABS: 50.0000 mg | ORAL_TABLET | Freq: Two times a day (BID) | ORAL | 0 refills | Status: DC

## 2017-08-17 MED ORDER — METOPROLOL TARTRATE 50 MG PO TABS: 50.0000 mg | ORAL_TABLET | Freq: Once | ORAL | 0 refills | Status: DC

## 2017-08-17 MED ORDER — NITROGLYCERIN 0.4 MG SL SUBL: 0.4000 mg | SUBLINGUAL_TABLET | SUBLINGUAL | 3 refills | Status: DC | PRN

## 2017-08-17 NOTE — Patient Instructions (Addendum)
Medication Instructions:   1. START TAKING NITROGLYCERIN 0.4 SUBLINGUAL TABLET AS NEEDED FOR CHEST PAIN  2.  TAKE OVER THERE COUNTER ZANTAC FOR REFLUX  3.   MAKE SURE YOU TAKE LOPRESSOR 50 MG ONE HOUR BEFORE TEST       If you need a refill on your cardiac medications before your next appointment, please call your pharmacy.  Labwork: RETURN FOR BMET ONE WEEK PRIOR  TO CT TEST     Testing/Procedures: Non-Cardiac CT Angiography (CTA), is a special type of CT scan that uses a computer to produce multi-dimensional views of major blood vessels throughout the body. In CT angiography, a contrast material is injected through an IV to help visualize the blood vessels    Follow-Up:    AS SCHEDULED  WITH DR Meda Coffee     Any Other Special Instructions Will Be Listed Below (If Applicable).   Please arrive at the [facility] main entrance of [facility] at [place]  (30-45 minutes prior to test start time)  [facility] [address] [place], [place] [address] [phone]  Proceed to the [facility] Radiology Department (First Floor).  Please follow these instructions carefully (unless otherwise directed):  Hold all erectile dysfunction medications at least 48 hours prior to test.  On the Night Before the Test: . Drink plenty of water. . Do not consume any caffeinated/decaffeinated beverages or chocolate 12 hours prior to your test. . Do not take any antihistamines 12 hours prior to your test. . If you take Metformin do not take 24 hours prior to test. . If the patient has contrast allergy: ? Patient will need a prescription for Prednisone and very clear instructions (as follows): 1. Prednisone 50 mg - take 13 hours prior to test 2. Take another Prednisone 50 mg 7 hours prior to test 3. Take another Prednisone 50 mg 1 hour prior to test 4. Take Benadryl 50 mg 1 hour prior to test . Patient must complete all four doses of above prophylactic  medications. . Patient will need a ride after test due to Benadryl.  On the Day of the Test: . Drink plenty of water. Do not drink any water within one hour of the test. . Do not eat any food 4 hours prior to the test. . You may take your regular medications prior to the test. . IF NOT ON A BETA BLOCKER - Take 50 mg of lopressor (metoprolol) one hour before the test. . HOLD Furosemide morning of the test.  After the Test: . Drink plenty of water. . After receiving IV contrast, you may experience a mild flushed feeling. This is normal. . On occasion, you may experience a mild rash up to 24 hours after the test. This is not dangerous. If this occurs, you can take Benadryl 25 mg and increase your fluid intake. . If you experience trouble breathing, this can be serious. If it is severe call 911 IMMEDIATELY. If it is mild, please call our office. . If you take any of these medications: Glipizide/Metformin, Avandament, Glucavance, please do not take 48 hours after completing test.

## 2017-08-31 ENCOUNTER — Telehealth: Payer: Self-pay | Admitting: Cardiology

## 2017-08-31 NOTE — Telephone Encounter (Signed)
New message   Patient scheduled for Cardiac Ct  June 14th @1030a 

## 2017-09-21 ENCOUNTER — Other Ambulatory Visit: Payer: PPO

## 2017-09-28 ENCOUNTER — Ambulatory Visit (HOSPITAL_COMMUNITY): Payer: PPO

## 2018-02-07 DIAGNOSIS — Z23 Encounter for immunization: Secondary | ICD-10-CM | POA: Diagnosis not present

## 2018-02-07 DIAGNOSIS — N183 Chronic kidney disease, stage 3 (moderate): Secondary | ICD-10-CM | POA: Diagnosis not present

## 2018-02-07 DIAGNOSIS — M25562 Pain in left knee: Secondary | ICD-10-CM | POA: Diagnosis not present

## 2018-02-07 DIAGNOSIS — K120 Recurrent oral aphthae: Secondary | ICD-10-CM | POA: Diagnosis not present

## 2018-03-13 DIAGNOSIS — L819 Disorder of pigmentation, unspecified: Secondary | ICD-10-CM | POA: Diagnosis not present

## 2018-03-13 DIAGNOSIS — L900 Lichen sclerosus et atrophicus: Secondary | ICD-10-CM | POA: Diagnosis not present

## 2018-03-13 DIAGNOSIS — L814 Other melanin hyperpigmentation: Secondary | ICD-10-CM | POA: Diagnosis not present

## 2018-03-13 DIAGNOSIS — L821 Other seborrheic keratosis: Secondary | ICD-10-CM | POA: Diagnosis not present

## 2018-03-13 DIAGNOSIS — D2272 Melanocytic nevi of left lower limb, including hip: Secondary | ICD-10-CM | POA: Diagnosis not present

## 2018-03-13 DIAGNOSIS — D1801 Hemangioma of skin and subcutaneous tissue: Secondary | ICD-10-CM | POA: Diagnosis not present

## 2018-03-13 DIAGNOSIS — L820 Inflamed seborrheic keratosis: Secondary | ICD-10-CM | POA: Diagnosis not present

## 2018-08-08 ENCOUNTER — Other Ambulatory Visit: Payer: Self-pay | Admitting: Cardiology

## 2018-09-11 DIAGNOSIS — I519 Heart disease, unspecified: Secondary | ICD-10-CM | POA: Diagnosis not present

## 2018-09-11 DIAGNOSIS — I358 Other nonrheumatic aortic valve disorders: Secondary | ICD-10-CM | POA: Diagnosis not present

## 2018-09-11 DIAGNOSIS — I517 Cardiomegaly: Secondary | ICD-10-CM | POA: Diagnosis not present

## 2018-09-11 DIAGNOSIS — I10 Essential (primary) hypertension: Secondary | ICD-10-CM | POA: Diagnosis not present

## 2018-09-11 DIAGNOSIS — E782 Mixed hyperlipidemia: Secondary | ICD-10-CM | POA: Diagnosis not present

## 2018-09-27 DIAGNOSIS — E782 Mixed hyperlipidemia: Secondary | ICD-10-CM | POA: Diagnosis not present

## 2018-09-27 DIAGNOSIS — I10 Essential (primary) hypertension: Secondary | ICD-10-CM | POA: Diagnosis not present

## 2019-01-24 DIAGNOSIS — Z23 Encounter for immunization: Secondary | ICD-10-CM | POA: Diagnosis not present

## 2019-02-07 ENCOUNTER — Other Ambulatory Visit: Payer: Self-pay

## 2019-02-07 DIAGNOSIS — Z20822 Contact with and (suspected) exposure to covid-19: Secondary | ICD-10-CM

## 2019-02-09 LAB — NOVEL CORONAVIRUS, NAA: SARS-CoV-2, NAA: NOT DETECTED

## 2019-02-20 DIAGNOSIS — R600 Localized edema: Secondary | ICD-10-CM | POA: Diagnosis not present

## 2019-02-20 DIAGNOSIS — S90129A Contusion of unspecified lesser toe(s) without damage to nail, initial encounter: Secondary | ICD-10-CM | POA: Diagnosis not present

## 2019-02-26 DIAGNOSIS — I10 Essential (primary) hypertension: Secondary | ICD-10-CM | POA: Diagnosis not present

## 2019-02-26 DIAGNOSIS — R0602 Shortness of breath: Secondary | ICD-10-CM | POA: Diagnosis not present

## 2019-02-26 DIAGNOSIS — R609 Edema, unspecified: Secondary | ICD-10-CM | POA: Diagnosis not present

## 2019-07-03 DIAGNOSIS — L821 Other seborrheic keratosis: Secondary | ICD-10-CM | POA: Diagnosis not present

## 2019-07-03 DIAGNOSIS — L918 Other hypertrophic disorders of the skin: Secondary | ICD-10-CM | POA: Diagnosis not present

## 2019-07-03 DIAGNOSIS — D225 Melanocytic nevi of trunk: Secondary | ICD-10-CM | POA: Diagnosis not present

## 2019-07-03 DIAGNOSIS — D692 Other nonthrombocytopenic purpura: Secondary | ICD-10-CM | POA: Diagnosis not present

## 2019-07-03 DIAGNOSIS — D2262 Melanocytic nevi of left upper limb, including shoulder: Secondary | ICD-10-CM | POA: Diagnosis not present

## 2019-07-03 DIAGNOSIS — L814 Other melanin hyperpigmentation: Secondary | ICD-10-CM | POA: Diagnosis not present

## 2019-07-03 DIAGNOSIS — L820 Inflamed seborrheic keratosis: Secondary | ICD-10-CM | POA: Diagnosis not present

## 2019-07-03 DIAGNOSIS — D1801 Hemangioma of skin and subcutaneous tissue: Secondary | ICD-10-CM | POA: Diagnosis not present

## 2019-07-11 DIAGNOSIS — R5382 Chronic fatigue, unspecified: Secondary | ICD-10-CM | POA: Diagnosis not present

## 2019-07-15 DIAGNOSIS — E538 Deficiency of other specified B group vitamins: Secondary | ICD-10-CM | POA: Diagnosis not present

## 2019-07-15 DIAGNOSIS — R5382 Chronic fatigue, unspecified: Secondary | ICD-10-CM | POA: Diagnosis not present

## 2019-07-15 DIAGNOSIS — E611 Iron deficiency: Secondary | ICD-10-CM | POA: Diagnosis not present

## 2019-07-15 DIAGNOSIS — R0602 Shortness of breath: Secondary | ICD-10-CM | POA: Diagnosis not present

## 2019-07-15 DIAGNOSIS — Z0000 Encounter for general adult medical examination without abnormal findings: Secondary | ICD-10-CM | POA: Diagnosis not present

## 2019-07-15 DIAGNOSIS — E782 Mixed hyperlipidemia: Secondary | ICD-10-CM | POA: Diagnosis not present

## 2019-07-15 DIAGNOSIS — I10 Essential (primary) hypertension: Secondary | ICD-10-CM | POA: Diagnosis not present

## 2019-08-15 DIAGNOSIS — I10 Essential (primary) hypertension: Secondary | ICD-10-CM | POA: Diagnosis not present

## 2019-08-15 DIAGNOSIS — I351 Nonrheumatic aortic (valve) insufficiency: Secondary | ICD-10-CM | POA: Diagnosis not present

## 2019-08-15 DIAGNOSIS — E039 Hypothyroidism, unspecified: Secondary | ICD-10-CM | POA: Diagnosis not present

## 2020-01-22 DIAGNOSIS — H6123 Impacted cerumen, bilateral: Secondary | ICD-10-CM | POA: Diagnosis not present

## 2020-06-22 DIAGNOSIS — M1A4710 Other secondary chronic gout, right ankle and foot, without tophus (tophi): Secondary | ICD-10-CM | POA: Diagnosis not present

## 2020-06-22 DIAGNOSIS — M1990 Unspecified osteoarthritis, unspecified site: Secondary | ICD-10-CM | POA: Diagnosis not present

## 2020-07-01 DIAGNOSIS — L821 Other seborrheic keratosis: Secondary | ICD-10-CM | POA: Diagnosis not present

## 2020-07-01 DIAGNOSIS — R1013 Epigastric pain: Secondary | ICD-10-CM | POA: Diagnosis not present

## 2020-07-01 DIAGNOSIS — D692 Other nonthrombocytopenic purpura: Secondary | ICD-10-CM | POA: Diagnosis not present

## 2020-07-01 DIAGNOSIS — D2272 Melanocytic nevi of left lower limb, including hip: Secondary | ICD-10-CM | POA: Diagnosis not present

## 2020-07-01 DIAGNOSIS — R0789 Other chest pain: Secondary | ICD-10-CM | POA: Diagnosis not present

## 2020-07-01 DIAGNOSIS — L814 Other melanin hyperpigmentation: Secondary | ICD-10-CM | POA: Diagnosis not present

## 2020-07-01 DIAGNOSIS — D1801 Hemangioma of skin and subcutaneous tissue: Secondary | ICD-10-CM | POA: Diagnosis not present

## 2020-07-01 DIAGNOSIS — R0602 Shortness of breath: Secondary | ICD-10-CM | POA: Diagnosis not present

## 2020-07-01 DIAGNOSIS — K219 Gastro-esophageal reflux disease without esophagitis: Secondary | ICD-10-CM | POA: Diagnosis not present

## 2020-07-12 DIAGNOSIS — I10 Essential (primary) hypertension: Secondary | ICD-10-CM | POA: Diagnosis not present

## 2020-07-15 DIAGNOSIS — I519 Heart disease, unspecified: Secondary | ICD-10-CM | POA: Diagnosis not present

## 2020-07-15 DIAGNOSIS — K580 Irritable bowel syndrome with diarrhea: Secondary | ICD-10-CM | POA: Diagnosis not present

## 2020-07-15 DIAGNOSIS — D508 Other iron deficiency anemias: Secondary | ICD-10-CM | POA: Diagnosis not present

## 2020-07-15 DIAGNOSIS — Z0000 Encounter for general adult medical examination without abnormal findings: Secondary | ICD-10-CM | POA: Diagnosis not present

## 2020-07-15 DIAGNOSIS — I517 Cardiomegaly: Secondary | ICD-10-CM | POA: Diagnosis not present

## 2020-07-15 DIAGNOSIS — R399 Unspecified symptoms and signs involving the genitourinary system: Secondary | ICD-10-CM | POA: Diagnosis not present

## 2020-07-15 DIAGNOSIS — I351 Nonrheumatic aortic (valve) insufficiency: Secondary | ICD-10-CM | POA: Diagnosis not present

## 2020-07-15 DIAGNOSIS — E782 Mixed hyperlipidemia: Secondary | ICD-10-CM | POA: Diagnosis not present

## 2020-07-15 DIAGNOSIS — E039 Hypothyroidism, unspecified: Secondary | ICD-10-CM | POA: Diagnosis not present

## 2020-07-15 DIAGNOSIS — R109 Unspecified abdominal pain: Secondary | ICD-10-CM | POA: Diagnosis not present

## 2020-07-15 DIAGNOSIS — I358 Other nonrheumatic aortic valve disorders: Secondary | ICD-10-CM | POA: Diagnosis not present

## 2020-07-19 ENCOUNTER — Other Ambulatory Visit: Payer: Self-pay | Admitting: Family Medicine

## 2020-07-19 ENCOUNTER — Other Ambulatory Visit (HOSPITAL_COMMUNITY): Payer: Self-pay | Admitting: Family Medicine

## 2020-07-19 ENCOUNTER — Telehealth: Payer: PPO

## 2020-07-19 DIAGNOSIS — R7989 Other specified abnormal findings of blood chemistry: Secondary | ICD-10-CM

## 2020-07-19 DIAGNOSIS — I351 Nonrheumatic aortic (valve) insufficiency: Secondary | ICD-10-CM

## 2020-07-19 DIAGNOSIS — I358 Other nonrheumatic aortic valve disorders: Secondary | ICD-10-CM

## 2020-07-19 DIAGNOSIS — R0609 Other forms of dyspnea: Secondary | ICD-10-CM

## 2020-07-19 DIAGNOSIS — R0600 Dyspnea, unspecified: Secondary | ICD-10-CM

## 2020-07-19 DIAGNOSIS — I517 Cardiomegaly: Secondary | ICD-10-CM

## 2020-07-19 DIAGNOSIS — N183 Chronic kidney disease, stage 3 unspecified: Secondary | ICD-10-CM

## 2020-07-19 NOTE — Telephone Encounter (Signed)
Referral notes sent from [facility] at [place], Phone #: [phone], Fax #: [phone]   Notes sent to scheduling

## 2020-07-20 ENCOUNTER — Telehealth: Payer: PPO

## 2020-07-20 NOTE — Telephone Encounter (Signed)
EKG ON FILE PLACED IN DOCTOR'S BOX

## 2020-07-21 ENCOUNTER — Ambulatory Visit
Admission: RE | Admit: 2020-07-21 | Discharge: 2020-07-21 | Disposition: A | Discharge status: Home / Self Care | Payer: PPO | Source: Ambulatory Visit | Attending: Family Medicine | Admitting: Family Medicine

## 2020-07-21 DIAGNOSIS — N281 Cyst of kidney, acquired: Secondary | ICD-10-CM | POA: Diagnosis not present

## 2020-07-21 DIAGNOSIS — N183 Chronic kidney disease, stage 3 unspecified: Secondary | ICD-10-CM

## 2020-08-03 NOTE — Progress Notes (Signed)
Cardiology Consult Note    Date:  08/04/2020   ID:  Brooke [name], DOB Sep 03, 1934, MRN [id number]  PCP:  Maurice Small, MD  Cardiologist:  Fransico Him, MD   Chief Complaint  Patient presents with  . Aortic Insuffiency  . Hypertension    History of Present Illness:  Brooke [name] is a 86 y.o. female who is being seen today for the evaluation of chest pain and DOE at the request of Maurice Small, MD.  This is an 86yo female with a hx of HTN and AI who was seen in 2016 for DOE and chest pain.  She has a hx of AI that was mild by most recent echo in 2018 and normal myoview in 2016 with hypertensive BP response to exercise and poor exercise capacity was started on amlodipine. It was recommended that she get into a routine exercise program.  She was last seen by Dr. Meda Coffee in 2018 and is here to establish care. She was seen by our PA in 2019 and was complaining of Chest pain and a coronary CTA was ordered but never followed through with it.    She is here today for followup and is doing well.  She tells me that she has continued to have chest pain.  She says that it is intermittent.  If she leans over to pick up something on the floor she will feel chest discomfort in her chest and neck.  If she is out walking and walks up an incline she will feel the pain in her chest with radiation into her neck.  This has been off and on for over 20 years.  She has chronic DOE that she thinks is no worse than it has been in the past.   She denies any orthopnea, dizziness (except when first standing up), palpitations or syncope. She has chronic pedal edema that was started with amlodipine.  The amlodipine was decreased to 5mg  daily and still has occasional swelling in her feet. She is compliant with her meds and is tolerating meds with no SE.    Past Medical History:  Diagnosis Date  . Chest pain   . Diverticulitis   . HTN (hypertension)   . IBS (irritable bowel syndrome)   . Osteopenia     Past  Surgical History:  Procedure Laterality Date  . CARDIAC CATHETERIZATION    . DILATION AND CURETTAGE OF UTERUS      Current Medications: Current Meds  Medication Sig  . amLODipine (NORVASC) 5 MG tablet Take 1 tablet (5 mg total) by mouth daily. Pt needs to make appt in May for further refills  . aspirin 81 MG tablet Take 81 mg by mouth daily.  . Calcium-Phosphorus-Vitamin D (CITRACAL +D3 PO) Take 1 tablet by mouth daily.  . Cholecalciferol (VITAMIN D-3) 1000 UNITS CAPS Take 1,000 Units by mouth daily.   Marland Kitchen dicyclomine (BENTYL) 20 MG tablet Take 20 mg by mouth 3 (three) times daily.  . diphenhydrAMINE-APAP, sleep, (TYLENOL PM EXTRA STRENGTH PO) Take 1 tablet by mouth at bedtime.  . enalapril (VASOTEC) 20 MG tablet TAKE 1 TABLET BY MOUTH EVERY DAY  . famotidine (PEPCID) 40 MG tablet Take 40 mg by mouth at bedtime.  . hydrochlorothiazide (MICROZIDE) 12.5 MG capsule Take 12.5 mg by mouth daily.  . Loperamide HCl (IMODIUM PO) Take 0.5 tablets by mouth as needed.  . Multiple Vitamins-Minerals (CENTRUM SILVER PO) Take 1 tablet by mouth daily.    Allergies:   Macrobid [nitrofurantoin monohyd  macro], Sulfa antibiotics, and Ciprofloxacin   Social History   Socioeconomic History  . Marital status: Married    Spouse name: Not on file  . Number of children: Not on file  . Years of education: Not on file  . Highest education level: Not on file  Occupational History  . Not on file  Tobacco Use  . Smoking status: Never Smoker  . Smokeless tobacco: Never Used  Vaping Use  . Vaping Use: Never used  Substance and Sexual Activity  . Alcohol use: Yes    Alcohol/week: 0.0 standard drinks  . Drug use: No  . Sexual activity: Not on file  Other Topics Concern  . Not on file  Social History Narrative  . Not on file   Social Determinants of Health   Financial Resource Strain: Not on file  Food Insecurity: Not on file  Transportation Needs: Not on file  Physical Activity: Not on file   Stress: Not on file  Social Connections: Not on file     Family History:  The patient's family history includes Colon cancer in her mother; Heart attack in her father; Heart disease in her brother; Hypertension in her brother.   ROS:   Please see the history of present illness.    ROS All other systems reviewed and are negative.  No flowsheet data found.     PHYSICAL EXAM:   VS:  BP 140/68   Pulse 82   Ht 5\' 7"  (1.702 m)   Wt 150 lb 9.6 oz (68.3 kg)   SpO2 98%   BMI 23.59 kg/m    GEN: Well nourished, well developed, in no acute distress  HEENT: normal  Neck: no JVD, carotid bruits, or masses Cardiac: RRR; no murmurs, rubs, or gallops,no edema.  Intact distal pulses bilaterally.  Respiratory:  clear to auscultation bilaterally, normal work of breathing GI: soft, nontender, nondistended, + BS MS: no deformity or atrophy  Skin: warm and dry, no rash Neuro:  Alert and Oriented x 3, Strength and sensation are intact Psych: euthymic mood, full affect  Wt Readings from Last 3 Encounters:  08/04/20 150 lb 9.6 oz (68.3 kg)  08/17/17 152 lb (68.9 kg)  06/12/17 148 lb (67.1 kg)      Studies/Labs Reviewed:   EKG:  EKG is not ordered today.    Recent Labs: No results found for requested labs within last 8760 hours.   Lipid Panel No results found for: CHOL, TRIG, HDL, CHOLHDL, VLDL, LDLCALC, LDLDIRECT  Additional studies/ records that were reviewed today include:  2D echo 2018 and stress myoview 2016    ASSESSMENT:    1. Nonrheumatic aortic valve insufficiency   2. Hypertensive heart disease without heart failure   3. DOE (dyspnea on exertion)   4. Chest pain of uncertain etiology      PLAN:  In order of problems listed above:  1.  Aortic Insufficiency -this was mild on echo 2018 -I will repeat echo to make sure this is stable  2.  HTN -BP controlled on exam today -continue on amlodipine 5mg  daily, Enalapril 20mg  daily,  HCTZ 12.5mg  daily  3.  DOE -she  has continued to have DOE when walking -remote stress test with no ischemia, hypertensive BP response to exercise and poor exercise capacity -2D echo 2018 with normal LVF  4.  Chest pain -she continues to have chest pain when out walking up inclines that radiates into her neck.  This seems to be a chronic problem for  years along with SOB -she was supposed to have a coronary CTA done a few years ago but never followed through -I will get a coronary CTA to define coronary anatomy    Medication Adjustments/Labs and Tests Ordered: Current medicines are reviewed at length with the patient today.  Concerns regarding medicines are outlined above.  Medication changes, Labs and Tests ordered today are listed in the Patient Instructions below.  There are no Patient Instructions on file for this visit.   Signed, Fransico Him, MD  08/04/2020 9:28 AM    [address], [place], [place]  [address] Phone: [phone]; Fax: [phone]

## 2020-08-04 ENCOUNTER — Ambulatory Visit: Payer: PPO | Admitting: Cardiology

## 2020-08-04 ENCOUNTER — Encounter: Payer: Self-pay | Admitting: Cardiology

## 2020-08-04 ENCOUNTER — Other Ambulatory Visit: Payer: Self-pay

## 2020-08-04 VITALS — BP 140/68 | HR 82 | Ht 67.0 in | Wt 150.6 lb

## 2020-08-04 DIAGNOSIS — R079 Chest pain, unspecified: Secondary | ICD-10-CM

## 2020-08-04 DIAGNOSIS — R072 Precordial pain: Secondary | ICD-10-CM | POA: Diagnosis not present

## 2020-08-04 DIAGNOSIS — R0600 Dyspnea, unspecified: Secondary | ICD-10-CM | POA: Diagnosis not present

## 2020-08-04 DIAGNOSIS — I119 Hypertensive heart disease without heart failure: Secondary | ICD-10-CM

## 2020-08-04 DIAGNOSIS — I351 Nonrheumatic aortic (valve) insufficiency: Secondary | ICD-10-CM

## 2020-08-04 DIAGNOSIS — R0609 Other forms of dyspnea: Secondary | ICD-10-CM

## 2020-08-04 MED ORDER — METOPROLOL TARTRATE 100 MG PO TABS: ORAL_TABLET | ORAL | 0 refills | Status: DC

## 2020-08-04 NOTE — Addendum Note (Signed)
Addended by: Antonieta Iba on: 08/04/2020 09:38 AM   Modules accepted: Orders

## 2020-08-04 NOTE — Patient Instructions (Addendum)
Medication Instructions:  Your physician recommends that you continue on your current medications as directed. Please refer to the Current Medication list given to you today.  *If you need a refill on your cardiac medications before your next appointment, please call your pharmacy*   Lab Work: BMET prior to CT scan  If you have labs (blood work) drawn today and your tests are completely normal, you will receive your results only by: Marland Kitchen MyChart Message (if you have MyChart) OR . A paper copy in the mail If you have any lab test that is abnormal or we need to change your treatment, we will call you to review the results.   Testing/Procedures: Your physician has requested that you have a coronary CTA scan. Please see below for further instructions.    Follow-Up: At [facility], you and your health needs are our priority.  As part of our continuing mission to provide you with exceptional heart care, we have created designated Provider Care Teams.  These Care Teams include your primary Cardiologist (physician) and Advanced Practice Providers (APPs -  Physician Assistants and Nurse Practitioners) who all work together to provide you with the care you need, when you need it.  Your next appointment:   1 year(s)  The format for your next appointment:   In Person  Provider:   You may see Fransico Him, MD or one of the following Advanced Practice Providers on your designated Care Team:    Melina Copa, PA-C  Ermalinda Barrios, PA-C  Other Instructions Your cardiac CT will be scheduled at one of the below locations:   [facility] [address] [place], [place] [address] [phone]  Please arrive at the [facility] main entrance (entrance A) of [facility] 30 minutes prior to test start time. Proceed to the [facility] Radiology Department (first floor) to check-in and test prep.  Please follow these instructions carefully (unless otherwise directed):  On the  Night Before the Test: . Be sure to Drink plenty of water. . Do not consume any caffeinated/decaffeinated beverages or chocolate 12 hours prior to your test. . Do not take any antihistamines 12 hours prior to your test.  On the Day of the Test: . Drink plenty of water until 1 hour prior to the test. . Do not eat any food 4 hours prior to the test. . You may take your regular medications prior to the test.  . Take metoprolol (Lopressor) two hours prior to test. . HOLD Hydrochlorothiazide morning of the test. . FEMALES- please wear underwire-free bra if available  After the Test: . Drink plenty of water. . After receiving IV contrast, you may experience a mild flushed feeling. This is normal. . On occasion, you may experience a mild rash up to 24 hours after the test. This is not dangerous. If this occurs, you can take Benadryl 25 mg and increase your fluid intake. . If you experience trouble breathing, this can be serious. If it is severe call 911 IMMEDIATELY. If it is mild, please call our office. . If you take any of these medications: Glipizide/Metformin, Avandament, Glucavance, please do not take 48 hours after completing test unless otherwise instructed.   Once we have confirmed authorization from your insurance company, we will call you to set up a date and time for your test. Based on how quickly your insurance processes prior authorizations requests, please allow up to 4 weeks to be contacted for scheduling your Cardiac CT appointment. Be  advised that routine Cardiac CT appointments could be scheduled as many as 8 weeks after your provider has ordered it.  For non-scheduling related questions, please contact the cardiac imaging nurse navigator should you have any questions/concerns: Marchia Bond, Cardiac Imaging Nurse Navigator Gordy Clement, Cardiac Imaging Nurse Navigator [facility] Direct Office Dial: [phone]   For scheduling needs, including  cancellations and rescheduling, please call [place], [phone].

## 2020-08-10 ENCOUNTER — Other Ambulatory Visit: Payer: PPO | Admitting: *Deleted

## 2020-08-10 ENCOUNTER — Other Ambulatory Visit: Payer: Self-pay

## 2020-08-10 DIAGNOSIS — R0609 Other forms of dyspnea: Secondary | ICD-10-CM

## 2020-08-10 DIAGNOSIS — I119 Hypertensive heart disease without heart failure: Secondary | ICD-10-CM | POA: Diagnosis not present

## 2020-08-10 DIAGNOSIS — R0600 Dyspnea, unspecified: Secondary | ICD-10-CM | POA: Diagnosis not present

## 2020-08-10 DIAGNOSIS — R079 Chest pain, unspecified: Secondary | ICD-10-CM | POA: Diagnosis not present

## 2020-08-10 DIAGNOSIS — R072 Precordial pain: Secondary | ICD-10-CM

## 2020-08-10 LAB — BASIC METABOLIC PANEL
BUN/Creatinine Ratio: 17 (ref 12–28)
BUN: 23 mg/dL (ref 8–27)
CO2: 27 mmol/L (ref 20–29)
Calcium: 9.7 mg/dL (ref 8.7–10.3)
Chloride: 99 mmol/L (ref 96–106)
Creatinine, Ser: 1.33 mg/dL — ABNORMAL HIGH (ref 0.57–1.00)
Glucose: 83 mg/dL (ref 65–99)
Potassium: 4.6 mmol/L (ref 3.5–5.2)
Sodium: 139 mmol/L (ref 134–144)
eGFR: 40 mL/min/{1.73_m2} — ABNORMAL LOW (ref >59)

## 2020-08-11 ENCOUNTER — Telehealth: Payer: Self-pay

## 2020-08-11 DIAGNOSIS — I119 Hypertensive heart disease without heart failure: Secondary | ICD-10-CM

## 2020-08-11 DIAGNOSIS — R0602 Shortness of breath: Secondary | ICD-10-CM

## 2020-08-11 NOTE — Telephone Encounter (Signed)
-----   Message from Sueanne Margarita, MD sent at 08/11/2020 11:16 AM EDT ----- Repeat BMET in 1 week on lower dose of HCTZ ----- Message ----- From: Antonieta Iba, RN Sent: 08/11/2020  11:15 AM EDT To: Sueanne Margarita, MD  The patient has been notified of the result and verbalized understanding.  All questions (if any) were answered. Antonieta Iba, RN 08/11/2020 11:13 AM   Spoke with the patient who states that her PCP had increased her HCTZ to 25mg  daily for two weeks. On Monday 4/25 she went back to taking 12.5 mg daily. She would like to know if she still needs to stop HCTZ completely or see if kidney function improves back on her normal 12.5 mg daily dose. Patient encouraged to push fluids.

## 2020-08-11 NOTE — Telephone Encounter (Signed)
Spoke with the patient and she will continue on lower dose of HCTZ and repeat BMET in one week.

## 2020-08-12 ENCOUNTER — Telehealth (HOSPITAL_COMMUNITY): Payer: Self-pay | Admitting: *Deleted

## 2020-08-12 NOTE — Telephone Encounter (Signed)
Reaching out to patient to offer assistance regarding upcoming cardiac imaging study; pt verbalizes understanding of appt date/time, parking situation and where to check in, pre-test NPO status and medications ordered, and verified current allergies; name and call back number provided for further questions should they arise  Gordy Clement RN Navigator Cardiac [place] [facility] [phone] office [phone] cell  Pt aware to arrive at admitting at 11am on Aug 16, 2020 for an Infusion clinic appointment.  Pt to take 100mg  metoprolol 2 hours prior to cardiac CT scan.

## 2020-08-16 ENCOUNTER — Encounter (HOSPITAL_COMMUNITY): Payer: Self-pay

## 2020-08-16 ENCOUNTER — Other Ambulatory Visit: Payer: Self-pay

## 2020-08-16 ENCOUNTER — Ambulatory Visit (HOSPITAL_COMMUNITY)
Admission: RE | Admit: 2020-08-16 | Discharge: 2020-08-16 | Disposition: A | Discharge status: Home / Self Care | Payer: PPO | Source: Ambulatory Visit | Attending: Cardiology | Admitting: Cardiology

## 2020-08-16 ENCOUNTER — Encounter: Payer: Self-pay | Admitting: Cardiology

## 2020-08-16 DIAGNOSIS — R072 Precordial pain: Secondary | ICD-10-CM | POA: Diagnosis not present

## 2020-08-16 DIAGNOSIS — I2510 Atherosclerotic heart disease of native coronary artery without angina pectoris: Secondary | ICD-10-CM | POA: Insufficient documentation

## 2020-08-16 HISTORY — DX: Atherosclerotic heart disease of native coronary artery without angina pectoris: I25.10

## 2020-08-16 LAB — BASIC METABOLIC PANEL
Anion gap: 8 (ref 5–15)
BUN: 17 mg/dL (ref 8–23)
CO2: 27 mmol/L (ref 22–32)
Calcium: 9.6 mg/dL (ref 8.9–10.3)
Chloride: 98 mmol/L (ref 98–111)
Creatinine, Ser: 1.23 mg/dL — ABNORMAL HIGH (ref 0.44–1.00)
GFR, Estimated: 44 mL/min — ABNORMAL LOW (ref >60)
Glucose, Bld: 106 mg/dL — ABNORMAL HIGH (ref 70–99)
Potassium: 4.3 mmol/L (ref 3.5–5.1)
Sodium: 133 mmol/L — ABNORMAL LOW (ref 135–145)

## 2020-08-16 MED ORDER — SODIUM CHLORIDE 0.9 % WEIGHT BASED INFUSION: 1.0000 mL/kg/h | INTRAVENOUS | Status: DC

## 2020-08-16 MED ORDER — NITROGLYCERIN 0.4 MG SL SUBL
SUBLINGUAL_TABLET | SUBLINGUAL | Status: AC
  Administered 2020-08-16: 0.8 mg via SUBLINGUAL
  Filled 2020-08-16: qty 2

## 2020-08-16 MED ORDER — SODIUM CHLORIDE 0.9 % WEIGHT BASED INFUSION
3.0000 mL/kg/h | INTRAVENOUS | Status: AC
  Administered 2020-08-16: 3 mL/kg/h via INTRAVENOUS

## 2020-08-16 MED ORDER — IOHEXOL 350 MG/ML SOLN
100.0000 mL | Freq: Once | INTRAVENOUS | Status: AC | PRN
  Administered 2020-08-16: 100 mL via INTRAVENOUS

## 2020-08-16 MED ORDER — NITROGLYCERIN 0.4 MG SL SUBL: 0.8000 mg | SUBLINGUAL_TABLET | Freq: Once | SUBLINGUAL | Status: AC

## 2020-08-16 NOTE — Progress Notes (Signed)
Monica in [place] notified that pt will be ready for CT at 1220.  IVFs started at 1120

## 2020-08-18 ENCOUNTER — Telehealth: Payer: Self-pay

## 2020-08-18 DIAGNOSIS — R0609 Other forms of dyspnea: Secondary | ICD-10-CM

## 2020-08-18 DIAGNOSIS — I2510 Atherosclerotic heart disease of native coronary artery without angina pectoris: Secondary | ICD-10-CM

## 2020-08-18 DIAGNOSIS — R0600 Dyspnea, unspecified: Secondary | ICD-10-CM

## 2020-08-18 DIAGNOSIS — I351 Nonrheumatic aortic (valve) insufficiency: Secondary | ICD-10-CM

## 2020-08-18 DIAGNOSIS — R072 Precordial pain: Secondary | ICD-10-CM

## 2020-08-18 MED ORDER — AMLODIPINE BESYLATE 10 MG PO TABS: 10.0000 mg | ORAL_TABLET | Freq: Every day | ORAL | 3 refills | Status: DC

## 2020-08-18 NOTE — Telephone Encounter (Signed)
Brooke Margarita, MD  08/16/2020 1:59 PM EDT      Elevated coronary Ca score at 153 with mild calcified plaque in the LAD. Continue ASA and get an FLP and ALT    The patient has been notified of the result and verbalized understanding.  All questions (if any) were answered. Antonieta Iba, RN 08/18/2020 1:30 PM   Medications updated. Repeat labs scheduled for next week.

## 2020-08-18 NOTE — Telephone Encounter (Signed)
-----   Message from Antonieta Iba, RN sent at 08/16/2020  2:00 PM EDT -----  ----- Message ----- From: Sueanne Margarita, MD Sent: 08/16/2020   1:54 PM EDT To: Rebeca Alert [facility] Triage  Still with elevated SCr - stop enalapril and increase amlodipine to 10mg  daily and make sure she is off diuretics.  Check BP daily at lunch for a week and call with results.  REpeat BMET in 1 week

## 2020-08-25 ENCOUNTER — Ambulatory Visit (HOSPITAL_COMMUNITY): Payer: PPO | Attending: Cardiology

## 2020-08-25 ENCOUNTER — Other Ambulatory Visit: Payer: PPO | Admitting: *Deleted

## 2020-08-25 ENCOUNTER — Other Ambulatory Visit: Payer: Self-pay

## 2020-08-25 DIAGNOSIS — I119 Hypertensive heart disease without heart failure: Secondary | ICD-10-CM | POA: Diagnosis not present

## 2020-08-25 DIAGNOSIS — I2510 Atherosclerotic heart disease of native coronary artery without angina pectoris: Secondary | ICD-10-CM

## 2020-08-25 DIAGNOSIS — I351 Nonrheumatic aortic (valve) insufficiency: Secondary | ICD-10-CM | POA: Diagnosis not present

## 2020-08-25 DIAGNOSIS — R7989 Other specified abnormal findings of blood chemistry: Secondary | ICD-10-CM | POA: Diagnosis not present

## 2020-08-25 DIAGNOSIS — I358 Other nonrheumatic aortic valve disorders: Secondary | ICD-10-CM

## 2020-08-25 DIAGNOSIS — R0600 Dyspnea, unspecified: Secondary | ICD-10-CM | POA: Insufficient documentation

## 2020-08-25 DIAGNOSIS — R072 Precordial pain: Secondary | ICD-10-CM | POA: Diagnosis not present

## 2020-08-25 DIAGNOSIS — R0602 Shortness of breath: Secondary | ICD-10-CM | POA: Diagnosis not present

## 2020-08-25 DIAGNOSIS — R0609 Other forms of dyspnea: Secondary | ICD-10-CM

## 2020-08-25 DIAGNOSIS — I517 Cardiomegaly: Secondary | ICD-10-CM | POA: Diagnosis not present

## 2020-08-25 LAB — BASIC METABOLIC PANEL
BUN/Creatinine Ratio: 13 (ref 12–28)
BUN: 18 mg/dL (ref 8–27)
CO2: 24 mmol/L (ref 20–29)
Calcium: 9.6 mg/dL (ref 8.7–10.3)
Chloride: 102 mmol/L (ref 96–106)
Creatinine, Ser: 1.37 mg/dL — ABNORMAL HIGH (ref 0.57–1.00)
Glucose: 110 mg/dL — ABNORMAL HIGH (ref 65–99)
Potassium: 4.1 mmol/L (ref 3.5–5.2)
Sodium: 140 mmol/L (ref 134–144)
eGFR: 38 mL/min/{1.73_m2} — ABNORMAL LOW (ref >59)

## 2020-08-25 LAB — LIPID PANEL
Chol/HDL Ratio: 3.4 ratio (ref 0.0–4.4)
Cholesterol, Total: 163 mg/dL (ref 100–199)
HDL: 48 mg/dL (ref >39)
LDL Chol Calc (NIH): 98 mg/dL (ref 0–99)
Triglycerides: 92 mg/dL (ref 0–149)
VLDL Cholesterol Cal: 17 mg/dL (ref 5–40)

## 2020-08-25 LAB — ECHOCARDIOGRAM COMPLETE
Area-P 1/2: 3.31 cm2
P 1/2 time: 357 msec
S' Lateral: 2.2 cm

## 2020-08-25 LAB — ALT: ALT: 11 IU/L (ref 0–32)

## 2020-08-26 ENCOUNTER — Telehealth: Payer: Self-pay

## 2020-08-26 DIAGNOSIS — R072 Precordial pain: Secondary | ICD-10-CM

## 2020-08-26 DIAGNOSIS — R0600 Dyspnea, unspecified: Secondary | ICD-10-CM

## 2020-08-26 DIAGNOSIS — R0609 Other forms of dyspnea: Secondary | ICD-10-CM

## 2020-08-26 NOTE — Telephone Encounter (Signed)
Pt agrees to Pulmonary referral.

## 2020-08-26 NOTE — Telephone Encounter (Signed)
-----   Message from Sueanne Margarita, MD sent at 08/26/2020 11:32 AM EDT ----- Patient still having DOE and CP whenever she walks.  Coronary CTA showed mild non obstructive plaque in LAD and echo with hyperdynamic LVF.  She had PFTs donw in 2016 with severely reduced DLCO.  Please refer ASAP to Pulmonary Dr. Lake Bells or Chase Caller.

## 2020-08-27 ENCOUNTER — Telehealth: Payer: Self-pay

## 2020-08-27 DIAGNOSIS — I2510 Atherosclerotic heart disease of native coronary artery without angina pectoris: Secondary | ICD-10-CM

## 2020-08-27 MED ORDER — ROSUVASTATIN CALCIUM 10 MG PO TABS: 10.0000 mg | ORAL_TABLET | Freq: Every day | ORAL | 3 refills | Status: DC

## 2020-08-27 NOTE — Telephone Encounter (Signed)
-----   Message from Antonieta Iba, RN sent at 08/27/2020  9:19 AM EDT ----- Sueanne Margarita, MD  Antonieta Iba, RN LDL is not at goal of < 70 - please start Crestor 10mg  daily and repeat FLP and ALT in 6 weeks

## 2020-08-27 NOTE — Telephone Encounter (Signed)
The patient has been notified of the result and verbalized understanding.  All questions (if any) were answered. Antonieta Iba, RN 08/27/2020 9:27 AM

## 2020-09-01 DIAGNOSIS — I517 Cardiomegaly: Secondary | ICD-10-CM | POA: Diagnosis not present

## 2020-09-01 DIAGNOSIS — N189 Chronic kidney disease, unspecified: Secondary | ICD-10-CM | POA: Diagnosis not present

## 2020-09-01 DIAGNOSIS — N2581 Secondary hyperparathyroidism of renal origin: Secondary | ICD-10-CM | POA: Diagnosis not present

## 2020-09-01 DIAGNOSIS — I129 Hypertensive chronic kidney disease with stage 1 through stage 4 chronic kidney disease, or unspecified chronic kidney disease: Secondary | ICD-10-CM | POA: Diagnosis not present

## 2020-09-01 DIAGNOSIS — N1832 Chronic kidney disease, stage 3b: Secondary | ICD-10-CM | POA: Diagnosis not present

## 2020-09-01 DIAGNOSIS — D631 Anemia in chronic kidney disease: Secondary | ICD-10-CM | POA: Diagnosis not present

## 2020-09-01 DIAGNOSIS — R609 Edema, unspecified: Secondary | ICD-10-CM | POA: Diagnosis not present

## 2020-09-15 DIAGNOSIS — I129 Hypertensive chronic kidney disease with stage 1 through stage 4 chronic kidney disease, or unspecified chronic kidney disease: Secondary | ICD-10-CM | POA: Diagnosis not present

## 2020-09-28 ENCOUNTER — Encounter: Payer: Self-pay | Admitting: Internal Medicine

## 2020-09-28 ENCOUNTER — Other Ambulatory Visit: Payer: Self-pay

## 2020-09-28 ENCOUNTER — Ambulatory Visit: Payer: PPO | Admitting: Internal Medicine

## 2020-09-28 VITALS — BP 142/70 | HR 63 | Temp 97.1°F | Ht 68.0 in | Wt 150.0 lb

## 2020-09-28 DIAGNOSIS — R0602 Shortness of breath: Secondary | ICD-10-CM

## 2020-09-28 NOTE — Progress Notes (Signed)
Brooke [name]    [id number]    Dec 25, 1934  Primary Care Physician:Webb, Arbie Cookey, MD  Referring Physician: Sueanne Margarita, MD [phone] [address] [place],  [place] [address] Reason for Consultation: shortness of breath Date of Consultation: 09/28/2020  Chief complaint:   Chief Complaint  Patient presents with   Consult    Shortness of breath with exertion for past 18 years, worse last few years.     HPI: Brooke [name] is a 86 y.o. woman who presents for new patient evaluation of dyspnea on exertion. Worse when she goes walking up a hill.   She has seen a cardiologist and is now here to figure out what is causing her difficulty breathing.  She denies cough or wheezing but does have chest tightness. She has never taken any inhalers. She had pneumonia once remotely.   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   She walks for exercise and has to stop and rest on a bench. She was also having blood pressure medications changed and her amlodipine was increased and she thought it was related to this.   Social history:  Occupation: retired Metallurgist Exposures: lives at home with husband, has a International aid/development worker Smoking history: never smoker, had passive smoke exposure with her spouse.   Social History   Occupational History   Not on file  Tobacco Use   Smoking status: Never   Smokeless tobacco: Never  Vaping Use   Vaping Use: Never used  Substance and Sexual Activity   Alcohol use: Yes    Alcohol/week: 0.0 standard drinks   Drug use: No   Sexual activity: Not on file     Family History  Problem Relation Age of Onset   Colon cancer Mother    Heart attack Father    Heart disease Brother    Hypertension Brother    Asthma Neg Hx    Lung cancer Neg Hx     Past Medical History:  Diagnosis Date   CAD (coronary artery disease), native coronary artery    coronary Ca score 153 with  mild calcified plaque in the proximal LAD 25-49% by coronary CTA 08/2020   Chest pain    Chronic kidney disease    Diverticulitis    HTN (hypertension)    IBS (irritable bowel syndrome)    Osteopenia     Past Surgical History:  Procedure Laterality Date   CARDIAC CATHETERIZATION     DILATION AND CURETTAGE OF UTERUS       Physical Exam: Blood pressure (!) 142/70, pulse 63, temperature (!) 97.1 F (36.2 C), temperature source Temporal, height 5\' 8"  (1.727 m), weight 150 lb (68 kg), SpO2 99 %. Gen:      No acute distress ENT:  no nasal polyps, mucus membranes moist Lungs:    No increased respiratory effort, symmetric chest wall excursion, clear to auscultation bilaterally, no wheezes or crackles CV:         Regular rate and rhythm; soft systolic murmur,  No pedal edema Abd:      + bowel sounds; soft, non-tender; no distension MSK: no acute synovitis of DIP or PIP joints, no mechanics hands.  Skin:      Warm and dry; no rashes Neuro: normal speech, no focal facial asymmetry Psych: alert and oriented x3, normal mood and affect   Data Reviewed/Medical Decision Making:  Independent interpretation of tests: Imaging:  Review of patient's chest xray images revealed mild hyperinflation in 2016. The patient's images have been independently reviewed by me.    Echo: hyperdynamic EF 75%, severe LVH, mild aortic insufficiency  PFTs: I have personally reviewed the patient's PFTs and hyperinflation, reduced dlco, no airflow limitation - study values do not make sense - suspect technician error PFT Results Latest Ref Rng & Units 09/15/2014  FVC-Pre L 2.82  FVC-Predicted Pre % 91  FVC-Post L 2.80  FVC-Predicted Post % 90  Pre FEV1/FVC % % 82  Post FEV1/FCV % % 81  FEV1-Pre L 2.32  FEV1-Predicted Pre % 100  FEV1-Post L 2.28  DLCO uncorrected ml/min/mmHg 11.86  DLCO UNC% % 41  DLVA Predicted % 51  TLC L 11.03  TLC % Predicted % 200  RV % Predicted % 320    Labs: No results found  for: WBC, HGB, HCT, MCV, PLT   Immunization status:  Immunization History  Administered Date(s) Administered   Influenza,inj,Quad PF,6+ Mos 02/29/2016, 02/22/2017, 02/07/2018, 01/24/2019   PFIZER(Purple Top)SARS-COV-2 Vaccination 05/08/2019, 05/29/2019, 01/13/2020, 08/07/2020     I reviewed prior external note(s) from Cardiology, Dr. Lamonte Sakai  I reviewed the result(s) of the labs and imaging as noted above.   I have ordered PFT   Assessment:  Shortness of breath Hypertension, not well controlled Mild aortic regurgitation  Plan/Recommendations: Mrs. Newbrough has longstanding dyspnea which has worsened recently.  Suspect part of her symptoms are related to poorly controlled hypertension. Will proceed with PFTs for further evaluation - would be unusual to have COPD in someone who has never been a smoker. I will see her back after this.  We discussed disease management and progression at length today.    Return to Care: Return in about 2 weeks (around 10/12/2020).  Lenice Llamas, MD Pulmonary and [place]  CC: Sueanne Margarita, MD

## 2020-09-28 NOTE — Patient Instructions (Signed)
The patient should have follow up scheduled with myself in 2-3 weeks  Prior to next visit patient should have: Full set of PFTs - 1 hour

## 2020-10-11 ENCOUNTER — Other Ambulatory Visit: Payer: PPO | Admitting: *Deleted

## 2020-10-11 ENCOUNTER — Other Ambulatory Visit: Payer: Self-pay

## 2020-10-11 DIAGNOSIS — I2510 Atherosclerotic heart disease of native coronary artery without angina pectoris: Secondary | ICD-10-CM

## 2020-10-11 LAB — LIPID PANEL
Chol/HDL Ratio: 2.3 ratio (ref 0.0–4.4)
Cholesterol, Total: 104 mg/dL (ref 100–199)
HDL: 46 mg/dL (ref >39)
LDL Chol Calc (NIH): 37 mg/dL (ref 0–99)
Triglycerides: 118 mg/dL (ref 0–149)
VLDL Cholesterol Cal: 21 mg/dL (ref 5–40)

## 2020-10-11 LAB — ALT: ALT: 16 IU/L (ref 0–32)

## 2020-10-15 ENCOUNTER — Other Ambulatory Visit (HOSPITAL_COMMUNITY)
Admission: RE | Admit: 2020-10-15 | Discharge: 2020-10-15 | Disposition: A | Discharge status: Home / Self Care | Payer: PPO | Source: Ambulatory Visit | Attending: Internal Medicine | Admitting: Internal Medicine

## 2020-10-15 DIAGNOSIS — Z20822 Contact with and (suspected) exposure to covid-19: Secondary | ICD-10-CM | POA: Diagnosis not present

## 2020-10-15 DIAGNOSIS — Z01812 Encounter for preprocedural laboratory examination: Secondary | ICD-10-CM | POA: Diagnosis not present

## 2020-10-15 LAB — SARS CORONAVIRUS 2 (TAT 6-24 HRS): SARS Coronavirus 2: NEGATIVE

## 2020-10-19 ENCOUNTER — Encounter: Payer: Self-pay | Admitting: Internal Medicine

## 2020-10-19 ENCOUNTER — Ambulatory Visit: Payer: PPO | Admitting: Internal Medicine

## 2020-10-19 ENCOUNTER — Ambulatory Visit (INDEPENDENT_AMBULATORY_CARE_PROVIDER_SITE_OTHER): Payer: PPO | Admitting: Internal Medicine

## 2020-10-19 ENCOUNTER — Other Ambulatory Visit: Payer: Self-pay

## 2020-10-19 VITALS — BP 118/74 | HR 74 | Temp 97.9°F | Ht 67.0 in | Wt 149.0 lb

## 2020-10-19 DIAGNOSIS — R0602 Shortness of breath: Secondary | ICD-10-CM

## 2020-10-19 DIAGNOSIS — R911 Solitary pulmonary nodule: Secondary | ICD-10-CM | POA: Diagnosis not present

## 2020-10-19 LAB — PULMONARY FUNCTION TEST
DL/VA % pred: 105 %
DL/VA: 4.19 ml/min/mmHg/L
DLCO cor % pred: 83 %
DLCO cor: 17.25 ml/min/mmHg
DLCO unc % pred: 83 %
DLCO unc: 17.25 ml/min/mmHg
FEF 25-75 Post: 2.6 L/sec
FEF 25-75 Pre: 1.62 L/sec
FEF2575-%Change-Post: 60 %
FEF2575-%Pred-Post: 181 %
FEF2575-%Pred-Pre: 113 %
FEV1-%Change-Post: 9 %
FEV1-%Pred-Post: 97 %
FEV1-%Pred-Pre: 89 %
FEV1-Post: 2.07 L
FEV1-Pre: 1.9 L
FEV1FVC-%Change-Post: 4 %
FEV1FVC-%Pred-Pre: 107 %
FEV6-%Change-Post: 1 %
FEV6-%Pred-Post: 90 %
FEV6-%Pred-Pre: 88 %
FEV6-Post: 2.44 L
FEV6-Pre: 2.4 L
FEV6FVC-%Pred-Post: 105 %
FEV6FVC-%Pred-Pre: 105 %
FVC-%Change-Post: 4 %
FVC-%Pred-Post: 87 %
FVC-%Pred-Pre: 84 %
FVC-Post: 2.5 L
FVC-Pre: 2.4 L
Post FEV1/FVC ratio: 83 %
Post FEV6/FVC ratio: 100 %
Pre FEV1/FVC ratio: 79 %
Pre FEV6/FVC Ratio: 100 %
RV % pred: 96 %
RV: 2.51 L
TLC % pred: 93 %
TLC: 5.16 L

## 2020-10-19 NOTE — Progress Notes (Signed)
Brooke [name]    [id number]    May 30, 1934  Primary Care Physician:Webb, Arbie Cookey, MD Date of Appointment: 10/19/2020 Established Patient Visit  Chief complaint:   Chief Complaint  Patient presents with   Follow-up    SOB unchanged      HPI: Brooke [name] is a 86 y.o. woman with hypertension and shortness of breath  Interval Updates: Here for follow up after PFTs. PFTs are normal. Still having dyspnea on exertion. She denies cough, chest tightness, wheezing. She feels that she has a hard time getting a deep breath in, and that her breath gets "stuck" in her throat and neck. Denies dysphagia, globus. Symptoms not worse with talking, laughing. Sometimes worse after eating  but not consistently. Denies GERD. Does not take zantac anymore. Took it for a few days and it didn't help.   Flow volume loop reviewed and normal - no evidence of extrathoracic obstruction.   I have reviewed the patient's family social and past medical history and updated as appropriate.   Past Medical History:  Diagnosis Date   CAD (coronary artery disease), native coronary artery    coronary Ca score 153 with mild calcified plaque in the proximal LAD 25-49% by coronary CTA 08/2020   Chest pain    Chronic kidney disease    Diverticulitis    HTN (hypertension)    IBS (irritable bowel syndrome)    Osteopenia     Past Surgical History:  Procedure Laterality Date   CARDIAC CATHETERIZATION     DILATION AND CURETTAGE OF UTERUS      Family History  Problem Relation Age of Onset   Colon cancer Mother    Heart attack Father    Heart disease Brother    Hypertension Brother    Asthma Neg Hx    Lung cancer Neg Hx     Social History   Occupational History   Not on file  Tobacco Use   Smoking status: Never   Smokeless tobacco: Never  Vaping Use   Vaping Use: Never used  Substance and Sexual Activity   Alcohol use: Yes    Alcohol/week: 0.0 standard drinks   Drug use: No   Sexual  activity: Not on file     Physical Exam: Blood pressure 118/74, pulse 74, temperature 97.9 F (36.6 C), temperature source Oral, height 5\' 7"  (1.702 m), weight 149 lb (67.6 kg), SpO2 97 %.  Gen:      No acute distress ENT:  no nasal polyps, mucus membranes moist Lungs:    No increased respiratory effort, symmetric chest wall excursion, clear to auscultation bilaterally, no wheezes or crackles CV:         Regular rate and rhythm; no murmurs, rubs, or gallops.  No pedal edema   Data Reviewed: Imaging: I have personally reviewed the may 22 CT coronary calcium shows nodule in the LUL  PFTs: normal   PFT Results Latest Ref Rng & Units 10/19/2020 09/15/2014  FVC-Pre L 2.40 2.82  FVC-Predicted Pre % 84 91  FVC-Post L 2.50 2.80  FVC-Predicted Post % 87 90  Pre FEV1/FVC % % 79 82  Post FEV1/FCV % % 83 81  FEV1-Pre L 1.90 2.32  FEV1-Predicted Pre % 89 100  FEV1-Post L 2.07 2.28  DLCO uncorrected ml/min/mmHg 17.25 11.86  DLCO UNC% % 83 41  DLCO corrected ml/min/mmHg 17.25 -  DLCO COR %Predicted % 83 -  DLVA Predicted % 105 51  TLC L  5.16 11.03  TLC % Predicted % 93 200  RV % Predicted % 96 320   I have personally reviewed the patient's PFTs and normal pulmonary function  Labs:    Immunization status: Immunization History  Administered Date(s) Administered   Influenza,inj,Quad PF,6+ Mos 02/29/2016, 02/22/2017, 02/07/2018, 01/24/2019   PFIZER(Purple Top)SARS-COV-2 Vaccination 05/08/2019, 05/29/2019, 01/13/2020, 08/07/2020   Tdap 06/05/2017   Typhoid Live 04/23/2017    Assessment:  Shortness of breath LUL nodule  Plan/Recommendations: Mrs. Mumby has chronic dyspnea on exertion that is only provoked with exertion. She has no resting symptoms.  She has normal pulmonary function on PFTs. Her cardiac work up has been unremarkable. I think a CPET would be low yield.  Based on her description of symptoms where her breath gets stuck in her throat, next step for evaluation  would be ENT to evaluate for variable laryngeal obstruction ( such as Vocal cord dysfunction) with a laryngology specialist - preferably ENT at [place] Dr. Rowe Clack or Joya Gaskins. Speech therapy can be helpful for improving symptoms. She would like to speak with her PCP first, and will let me know  For her LUL nodule - I feel this is not related to her symptoms. Will proceed with dedicated CT Chest. I will contact her with results and further follow up.    Return to Care: Pending her CT Chest.    Lenice Llamas, MD Pulmonary and [place]

## 2020-10-19 NOTE — Patient Instructions (Addendum)
We will schedule your CT scan. I will call you with the results and next steps.

## 2020-10-19 NOTE — Patient Instructions (Signed)
Full PFT performed today, 

## 2020-10-19 NOTE — Progress Notes (Signed)
Full PFT performed today. °

## 2020-10-21 DIAGNOSIS — N1832 Chronic kidney disease, stage 3b: Secondary | ICD-10-CM | POA: Diagnosis not present

## 2020-10-28 ENCOUNTER — Ambulatory Visit (INDEPENDENT_AMBULATORY_CARE_PROVIDER_SITE_OTHER)
Admission: RE | Admit: 2020-10-28 | Discharge: 2020-10-28 | Disposition: A | Discharge status: Home / Self Care | Payer: PPO | Source: Ambulatory Visit | Attending: Internal Medicine | Admitting: Internal Medicine

## 2020-10-28 ENCOUNTER — Other Ambulatory Visit: Payer: Self-pay

## 2020-10-28 DIAGNOSIS — I700 Atherosclerosis of aorta: Secondary | ICD-10-CM | POA: Diagnosis not present

## 2020-10-28 DIAGNOSIS — R911 Solitary pulmonary nodule: Secondary | ICD-10-CM | POA: Diagnosis not present

## 2020-10-28 DIAGNOSIS — R0600 Dyspnea, unspecified: Secondary | ICD-10-CM | POA: Diagnosis not present

## 2020-11-01 DIAGNOSIS — N1832 Chronic kidney disease, stage 3b: Secondary | ICD-10-CM | POA: Diagnosis not present

## 2020-11-01 DIAGNOSIS — N2581 Secondary hyperparathyroidism of renal origin: Secondary | ICD-10-CM | POA: Diagnosis not present

## 2020-11-01 DIAGNOSIS — I129 Hypertensive chronic kidney disease with stage 1 through stage 4 chronic kidney disease, or unspecified chronic kidney disease: Secondary | ICD-10-CM | POA: Diagnosis not present

## 2020-11-01 DIAGNOSIS — R609 Edema, unspecified: Secondary | ICD-10-CM | POA: Diagnosis not present

## 2020-11-01 DIAGNOSIS — D631 Anemia in chronic kidney disease: Secondary | ICD-10-CM | POA: Diagnosis not present

## 2020-11-10 ENCOUNTER — Telehealth: Payer: Self-pay | Admitting: Internal Medicine

## 2020-11-10 DIAGNOSIS — N1832 Chronic kidney disease, stage 3b: Secondary | ICD-10-CM | POA: Diagnosis not present

## 2020-11-10 NOTE — Telephone Encounter (Signed)
Called and spoke with patient. Advised her that I would send a message for Dr. Shearon Stalls to read the results of her CT scan and we would call her back with the results. Patient verbalized understanding. Patient gave another number to call just encase she is not home. She can be reached at [phone]  Dr. Shearon Stalls please advise  Thank you

## 2020-11-10 NOTE — Telephone Encounter (Signed)
Pt is requesting for her CT results to be explained to her from (10/28/2020). Pls regard; [phone]

## 2020-11-12 NOTE — Telephone Encounter (Signed)
Noted.  Will close encounter.  

## 2020-11-12 NOTE — Telephone Encounter (Signed)
Sent patient a mychart message.

## 2021-02-08 DIAGNOSIS — N2581 Secondary hyperparathyroidism of renal origin: Secondary | ICD-10-CM | POA: Diagnosis not present

## 2021-02-08 DIAGNOSIS — D631 Anemia in chronic kidney disease: Secondary | ICD-10-CM | POA: Diagnosis not present

## 2021-02-08 DIAGNOSIS — N1832 Chronic kidney disease, stage 3b: Secondary | ICD-10-CM | POA: Diagnosis not present

## 2021-02-08 DIAGNOSIS — R609 Edema, unspecified: Secondary | ICD-10-CM | POA: Diagnosis not present

## 2021-02-08 DIAGNOSIS — I129 Hypertensive chronic kidney disease with stage 1 through stage 4 chronic kidney disease, or unspecified chronic kidney disease: Secondary | ICD-10-CM | POA: Diagnosis not present

## 2021-04-07 DIAGNOSIS — L819 Disorder of pigmentation, unspecified: Secondary | ICD-10-CM | POA: Diagnosis not present

## 2021-04-07 DIAGNOSIS — D225 Melanocytic nevi of trunk: Secondary | ICD-10-CM | POA: Diagnosis not present

## 2021-04-07 DIAGNOSIS — Z79899 Other long term (current) drug therapy: Secondary | ICD-10-CM | POA: Diagnosis not present

## 2021-04-07 DIAGNOSIS — L821 Other seborrheic keratosis: Secondary | ICD-10-CM | POA: Diagnosis not present

## 2021-04-07 DIAGNOSIS — L80 Vitiligo: Secondary | ICD-10-CM | POA: Diagnosis not present

## 2021-04-07 DIAGNOSIS — D1801 Hemangioma of skin and subcutaneous tissue: Secondary | ICD-10-CM | POA: Diagnosis not present

## 2021-04-07 DIAGNOSIS — L814 Other melanin hyperpigmentation: Secondary | ICD-10-CM | POA: Diagnosis not present

## 2021-05-09 DIAGNOSIS — E039 Hypothyroidism, unspecified: Secondary | ICD-10-CM | POA: Diagnosis not present

## 2021-05-09 DIAGNOSIS — E2839 Other primary ovarian failure: Secondary | ICD-10-CM | POA: Diagnosis not present

## 2021-05-09 DIAGNOSIS — N183 Chronic kidney disease, stage 3 unspecified: Secondary | ICD-10-CM | POA: Diagnosis not present

## 2021-05-18 ENCOUNTER — Other Ambulatory Visit: Payer: Self-pay | Admitting: Family Medicine

## 2021-05-18 DIAGNOSIS — E2839 Other primary ovarian failure: Secondary | ICD-10-CM

## 2021-06-01 DIAGNOSIS — N1832 Chronic kidney disease, stage 3b: Secondary | ICD-10-CM | POA: Diagnosis not present

## 2021-06-15 DIAGNOSIS — Z23 Encounter for immunization: Secondary | ICD-10-CM | POA: Diagnosis not present

## 2021-06-15 DIAGNOSIS — E039 Hypothyroidism, unspecified: Secondary | ICD-10-CM | POA: Diagnosis not present

## 2021-08-01 DIAGNOSIS — Z79899 Other long term (current) drug therapy: Secondary | ICD-10-CM | POA: Diagnosis not present

## 2021-08-01 DIAGNOSIS — K30 Functional dyspepsia: Secondary | ICD-10-CM | POA: Diagnosis not present

## 2021-08-01 DIAGNOSIS — N183 Chronic kidney disease, stage 3 unspecified: Secondary | ICD-10-CM | POA: Diagnosis not present

## 2021-08-01 DIAGNOSIS — I519 Heart disease, unspecified: Secondary | ICD-10-CM | POA: Diagnosis not present

## 2021-08-01 DIAGNOSIS — R5382 Chronic fatigue, unspecified: Secondary | ICD-10-CM | POA: Diagnosis not present

## 2021-08-01 DIAGNOSIS — I358 Other nonrheumatic aortic valve disorders: Secondary | ICD-10-CM | POA: Diagnosis not present

## 2021-08-01 DIAGNOSIS — Z0000 Encounter for general adult medical examination without abnormal findings: Secondary | ICD-10-CM | POA: Diagnosis not present

## 2021-08-01 DIAGNOSIS — E039 Hypothyroidism, unspecified: Secondary | ICD-10-CM | POA: Diagnosis not present

## 2021-08-01 DIAGNOSIS — I10 Essential (primary) hypertension: Secondary | ICD-10-CM | POA: Diagnosis not present

## 2021-08-01 DIAGNOSIS — K580 Irritable bowel syndrome with diarrhea: Secondary | ICD-10-CM | POA: Diagnosis not present

## 2021-08-01 DIAGNOSIS — E782 Mixed hyperlipidemia: Secondary | ICD-10-CM | POA: Diagnosis not present

## 2021-08-01 DIAGNOSIS — I351 Nonrheumatic aortic (valve) insufficiency: Secondary | ICD-10-CM | POA: Diagnosis not present

## 2021-08-01 DIAGNOSIS — I517 Cardiomegaly: Secondary | ICD-10-CM | POA: Diagnosis not present

## 2021-08-03 DIAGNOSIS — I10 Essential (primary) hypertension: Secondary | ICD-10-CM | POA: Diagnosis not present

## 2021-08-03 DIAGNOSIS — R1013 Epigastric pain: Secondary | ICD-10-CM | POA: Diagnosis not present

## 2021-08-06 ENCOUNTER — Other Ambulatory Visit: Payer: Self-pay | Admitting: Cardiology

## 2021-08-09 DIAGNOSIS — R5381 Other malaise: Secondary | ICD-10-CM | POA: Diagnosis not present

## 2021-08-09 DIAGNOSIS — R079 Chest pain, unspecified: Secondary | ICD-10-CM | POA: Diagnosis not present

## 2021-08-09 DIAGNOSIS — I517 Cardiomegaly: Secondary | ICD-10-CM | POA: Diagnosis not present

## 2021-08-09 DIAGNOSIS — K5900 Constipation, unspecified: Secondary | ICD-10-CM | POA: Diagnosis not present

## 2021-08-09 DIAGNOSIS — I10 Essential (primary) hypertension: Secondary | ICD-10-CM | POA: Diagnosis not present

## 2021-08-10 DIAGNOSIS — N1832 Chronic kidney disease, stage 3b: Secondary | ICD-10-CM | POA: Diagnosis not present

## 2021-08-10 DIAGNOSIS — N2581 Secondary hyperparathyroidism of renal origin: Secondary | ICD-10-CM | POA: Diagnosis not present

## 2021-08-10 DIAGNOSIS — I129 Hypertensive chronic kidney disease with stage 1 through stage 4 chronic kidney disease, or unspecified chronic kidney disease: Secondary | ICD-10-CM | POA: Diagnosis not present

## 2021-08-10 DIAGNOSIS — H6123 Impacted cerumen, bilateral: Secondary | ICD-10-CM | POA: Diagnosis not present

## 2021-08-10 DIAGNOSIS — D631 Anemia in chronic kidney disease: Secondary | ICD-10-CM | POA: Diagnosis not present

## 2021-08-18 DIAGNOSIS — R110 Nausea: Secondary | ICD-10-CM | POA: Diagnosis not present

## 2021-08-18 DIAGNOSIS — F330 Major depressive disorder, recurrent, mild: Secondary | ICD-10-CM | POA: Diagnosis not present

## 2021-08-29 ENCOUNTER — Other Ambulatory Visit: Payer: Self-pay | Admitting: Family Medicine

## 2021-08-29 DIAGNOSIS — M109 Gout, unspecified: Secondary | ICD-10-CM | POA: Diagnosis not present

## 2021-08-29 DIAGNOSIS — R110 Nausea: Secondary | ICD-10-CM

## 2021-09-02 ENCOUNTER — Ambulatory Visit
Admission: RE | Admit: 2021-09-02 | Discharge: 2021-09-02 | Disposition: A | Discharge status: Home / Self Care | Payer: PPO | Source: Ambulatory Visit | Attending: Family Medicine | Admitting: Family Medicine

## 2021-09-02 DIAGNOSIS — K838 Other specified diseases of biliary tract: Secondary | ICD-10-CM | POA: Diagnosis not present

## 2021-09-02 DIAGNOSIS — R110 Nausea: Secondary | ICD-10-CM

## 2021-09-02 DIAGNOSIS — K8020 Calculus of gallbladder without cholecystitis without obstruction: Secondary | ICD-10-CM | POA: Diagnosis not present

## 2021-09-02 DIAGNOSIS — R109 Unspecified abdominal pain: Secondary | ICD-10-CM | POA: Diagnosis not present

## 2021-09-03 ENCOUNTER — Other Ambulatory Visit: Payer: Self-pay | Admitting: Cardiology

## 2021-09-08 ENCOUNTER — Encounter: Payer: Self-pay | Admitting: Cardiology

## 2021-09-08 ENCOUNTER — Ambulatory Visit: Payer: PPO | Admitting: Cardiology

## 2021-09-08 VITALS — BP 118/70 | HR 70 | Ht 67.0 in | Wt 148.0 lb

## 2021-09-08 DIAGNOSIS — M109 Gout, unspecified: Secondary | ICD-10-CM | POA: Diagnosis not present

## 2021-09-08 DIAGNOSIS — I2510 Atherosclerotic heart disease of native coronary artery without angina pectoris: Secondary | ICD-10-CM

## 2021-09-08 DIAGNOSIS — R0609 Other forms of dyspnea: Secondary | ICD-10-CM

## 2021-09-08 DIAGNOSIS — E7800 Pure hypercholesterolemia, unspecified: Secondary | ICD-10-CM | POA: Diagnosis not present

## 2021-09-08 DIAGNOSIS — I351 Nonrheumatic aortic (valve) insufficiency: Secondary | ICD-10-CM | POA: Diagnosis not present

## 2021-09-08 DIAGNOSIS — I10 Essential (primary) hypertension: Secondary | ICD-10-CM | POA: Diagnosis not present

## 2021-09-08 DIAGNOSIS — K8020 Calculus of gallbladder without cholecystitis without obstruction: Secondary | ICD-10-CM | POA: Diagnosis not present

## 2021-09-08 NOTE — Patient Instructions (Signed)
Medication Instructions:  Your physician recommends that you continue on your current medications as directed. Please refer to the Current Medication list given to you today.  *If you need a refill on your cardiac medications before your next appointment, please call your pharmacy*  Testing/Procedures: Your provider has recommended that you have a cardiac MRI.   Follow-Up: At [facility], you and your health needs are our priority.  As part of our continuing mission to provide you with exceptional heart care, we have created designated Provider Care Teams.  These Care Teams include your primary Cardiologist (physician) and Advanced Practice Providers (APPs -  Physician Assistants and Nurse Practitioners) who all work together to provide you with the care you need, when you need it.    Your next appointment:   1 year(s)  The format for your next appointment:   In Person  Provider:   Fransico Him, MD     Important Information About Sugar

## 2021-09-08 NOTE — Addendum Note (Signed)
Addended by: Antonieta Iba on: 09/08/2021 11:24 AM   Modules accepted: Orders

## 2021-09-08 NOTE — Progress Notes (Addendum)
Cardiology office note    Date:  09/08/2021   ID:  Brooke [name], DOB 11-Aug-1934, MRN [id number]  PCP:  Maurice Small, MD  Cardiologist:  Fransico Him, MD   Chief Complaint  Patient presents with   Coronary Artery Disease   Hypertension   Hyperlipidemia    History of Present Illness:  Brooke [name] is a 87 y.o. female with a hx of HTN and AI who was seen in 2016 for DOE and chest pain.  She has a hx of AI that was mild by echo in 2018 and normal myoview in 2016 with hypertensive BP response to exercise and poor exercise capacity was started on amlodipine. It was recommended that she get into a routine exercise program.   She has chronic chest pain.  If she leans over to pick up something on the floor she will feel chest discomfort in her chest and neck.  If she is out walking and walks up an incline she will feel the pain in her chest with radiation into her neck.  This has been off and on for over 20 years.  She has chronic DOE that she thinks is no worse than it has been in the past.     When she was seen by Dr. Meda Coffee she was post to have a coronary CTA but this was never done.  She underwent coronary CTA May 2022 showing coronary calcium score 153 with nonobstructive CAD with 25 to 49% calcified plaque in the proximal LAD and 25 to 49% soft plaque in the distal LAD after the D2 and before the D3 and otherwise normal coronary arteries.  2D echo done 09/03/2020 showed hyperdynamic LV function with EF 75% and grade 1 diastolic dysfunction with severe LVH and normal LV strain.  There was mild to moderate AI.    She is followed by Pulmonary and PFTs were normal.  She told them that her breathing feels like it gets stuck in her throat and neck.  It was recommended that she see ENT but that was never done.   She is here today for followup and is doing well.  She continues to have DOE walking up hills that is chronic and unchanged in years. She she gets the SOB she will have chest pain as  well which is also the same and unchanged from years ago.  She denies any PND, orthopnea, LE edema, dizziness, palpitations or syncope. She is compliant with her meds and is tolerating meds with no SE.     Past Medical History:  Diagnosis Date   CAD (coronary artery disease), native coronary artery    coronary Ca score 153 with mild calcified plaque in the proximal LAD 25-49% by coronary CTA 08/2020   Chest pain    Chronic kidney disease    Diverticulitis    HTN (hypertension)    IBS (irritable bowel syndrome)    Osteopenia     Past Surgical History:  Procedure Laterality Date   CARDIAC CATHETERIZATION     DILATION AND CURETTAGE OF UTERUS      Current Medications: Current Meds  Medication Sig   aspirin 81 MG tablet Take 81 mg by mouth daily.   Calcium-Phosphorus-Vitamin D (CITRACAL +D3 PO) Take 1 tablet by mouth daily.   chlorthalidone (HYGROTON) 25 MG tablet Take 25 mg by mouth every other day.   Cholecalciferol (VITAMIN D-3) 1000 UNITS CAPS Take 1,000 Units by mouth daily.    diphenhydrAMINE-APAP, sleep, (TYLENOL PM EXTRA STRENGTH PO)  Take 1 tablet by mouth at bedtime.   Loperamide HCl (IMODIUM PO) Take 0.5 tablets by mouth as needed.   Multiple Vitamins-Minerals (CENTRUM SILVER PO) Take 1 tablet by mouth daily.   PFIZER-BIONT COVID-19 VAC-TRIS SUSP injection    rosuvastatin (CRESTOR) 10 MG tablet TAKE 1 TABLET BY MOUTH EVERY DAY   telmisartan (MICARDIS) 20 MG tablet Take 20 mg by mouth daily.    Allergies:   Macrobid [nitrofurantoin monohyd macro], Sulfa antibiotics, Amlodipine, and Ciprofloxacin   Social History   Socioeconomic History   Marital status: Married    Spouse name: Not on file   Number of children: 4   Years of education: Not on file   Highest education level: Not on file  Occupational History   Not on file  Tobacco Use   Smoking status: Never   Smokeless tobacco: Never  Vaping Use   Vaping Use: Never used  Substance and Sexual Activity   Alcohol  use: Yes    Alcohol/week: 0.0 standard drinks   Drug use: No   Sexual activity: Not on file  Other Topics Concern   Not on file  Social History Narrative   Lives with husband.    Social Determinants of Health   Financial Resource Strain: Not on file  Food Insecurity: Not on file  Transportation Needs: Not on file  Physical Activity: Not on file  Stress: Not on file  Social Connections: Not on file     Family History:  The patient's family history includes Colon cancer in her mother; Heart attack in her father; Heart disease in her brother; Hypertension in her brother.   ROS:   Please see the history of present illness.    ROS All other systems reviewed and are negative.      View : No data to display.             PHYSICAL EXAM:   VS:  Ht '5\' 7"'$  (1.702 m)   Wt 148 lb (67.1 kg)   BMI 23.18 kg/m    GEN: Well nourished, well developed in no acute distress HEENT: Normal NECK: No JVD; No carotid bruits LYMPHATICS: No lymphadenopathy CARDIAC:RRR, no murmurs, rubs, gallops RESPIRATORY:  Clear to auscultation without rales, wheezing or rhonchi  ABDOMEN: Soft, non-tender, non-distended MUSCULOSKELETAL:  No edema; No deformity  SKIN: Warm and dry NEUROLOGIC:  Alert and oriented x 3 PSYCHIATRIC:  Normal affect   Wt Readings from Last 3 Encounters:  09/08/21 148 lb (67.1 kg)  10/19/20 149 lb (67.6 kg)  09/28/20 150 lb (68 kg)      Studies/Labs Reviewed:   EKG:  EKG is ordered today and demonstrates NSR with RAE  Recent Labs: 10/11/2020: ALT 16   Lipid Panel    Component Value Date/Time   CHOL 104 10/11/2020 0938   TRIG 118 10/11/2020 0938   HDL 46 10/11/2020 0938   CHOLHDL 2.3 10/11/2020 0938   LDLCALC 37 10/11/2020 0938    Additional studies/ records that were reviewed today include:  2D echo 2018 and stress myoview 2016    ASSESSMENT:    1. Nonrheumatic aortic valve insufficiency   2. Essential hypertension   3. DOE (dyspnea on exertion)   4.  Coronary artery disease involving native coronary artery of native heart without angina pectoris   5. Pure hypercholesterolemia      PLAN:  In order of problems listed above:  1.  Aortic Insufficiency -this was mild to moderate 08/2020 -check MRI since she is getting  cMRI to assess the severe LVH on echo  2.  HTN -BP is controlled on exam today -Continue prescription drug therapy with chlorthalidone 25 mg every other day, Toprol XL '25mg'$  daily,telmisartan 20 mg daily with.  Refills -I have personally reviewed and interpreted outside labs performed by patient's PCP which showed serum creatinine 1.25, potassium 4 on 08/01/2021  3.  DOE -she has continued to have DOE when walking -remote stress test with no ischemia, hypertensive BP response to exercise and poor exercise capacity>>suspect this is mainly deconditioning  -2D echo 2022 with hyperdynamic LV function and grade 1 diastolic dysfunction but also severe LVH which was new and BP is controlled>>? Intiltrative disorder of myocardium needs to be ruled out -Coronary CTA with no obstructive CAD 2022 -she is followed by Pulmonary and was referred to ENT but that never happened so I will place referral -check cMRI to rule out infiltrate myocardial disease  4.  ASCAD/Chest pain -she continues to have chest pain when out walking up inclines that radiates into her neck.  This seems to be a chronic problem for years along with SOB -coronary CTA 08/2021 showed a coronary calcium score of 153 with nonobstructive calcified plaque of 25 to 49% in the proximal LAD and noncalcified 25 to 49% plaque in the distal LAD >> I do not think this is the etiology of her SOB or CP -check cMRI to reassess LVF as well as workup severe LVH noted on echo a year ago and ongoing SOB to make sure there is no evidence of infiltrative cardiac disease -Continue prescription drug therapy with aspirin 81 mg daily, BB and statin therapy  5.  Hyperlipidemia -LDL goal less  than 70 -I have personally reviewed and interpreted outside labs performed by patient's PCP which showed LDL 38, HDL 46, triglycerides 96 on 08/01/2021 ALT 15 -Continue prescription drug management with Crestor 10 mg daily with as needed refills   Medication Adjustments/Labs and Tests Ordered: Current medicines are reviewed at length with the patient today.  Concerns regarding medicines are outlined above.  Medication changes, Labs and Tests ordered today are listed in the Patient Instructions below.  There are no Patient Instructions on file for this visit.   Signed, Fransico Him, MD  09/08/2021 11:02 AM    [facility] [address], [place], [place]  [address] Phone: [phone]; Fax: [phone]

## 2021-09-13 ENCOUNTER — Other Ambulatory Visit: Payer: Self-pay

## 2021-09-13 DIAGNOSIS — R0602 Shortness of breath: Secondary | ICD-10-CM

## 2021-09-30 ENCOUNTER — Other Ambulatory Visit: Payer: Self-pay | Admitting: Cardiology

## 2021-10-19 DIAGNOSIS — H903 Sensorineural hearing loss, bilateral: Secondary | ICD-10-CM | POA: Diagnosis not present

## 2021-10-26 ENCOUNTER — Ambulatory Visit
Admission: RE | Admit: 2021-10-26 | Discharge: 2021-10-26 | Disposition: A | Discharge status: Home / Self Care | Payer: PPO | Source: Ambulatory Visit | Attending: Family Medicine | Admitting: Family Medicine

## 2021-10-26 DIAGNOSIS — Z780 Asymptomatic menopausal state: Secondary | ICD-10-CM | POA: Diagnosis not present

## 2021-10-26 DIAGNOSIS — E2839 Other primary ovarian failure: Secondary | ICD-10-CM

## 2021-10-26 DIAGNOSIS — M810 Age-related osteoporosis without current pathological fracture: Secondary | ICD-10-CM | POA: Diagnosis not present

## 2021-10-26 DIAGNOSIS — M85852 Other specified disorders of bone density and structure, left thigh: Secondary | ICD-10-CM | POA: Diagnosis not present

## 2021-11-07 ENCOUNTER — Other Ambulatory Visit: Payer: Self-pay | Admitting: General Surgery

## 2021-11-07 ENCOUNTER — Telehealth: Payer: Self-pay | Admitting: *Deleted

## 2021-11-07 DIAGNOSIS — K8020 Calculus of gallbladder without cholecystitis without obstruction: Secondary | ICD-10-CM

## 2021-11-07 DIAGNOSIS — N1832 Chronic kidney disease, stage 3b: Secondary | ICD-10-CM | POA: Diagnosis not present

## 2021-11-07 DIAGNOSIS — K580 Irritable bowel syndrome with diarrhea: Secondary | ICD-10-CM | POA: Diagnosis not present

## 2021-11-07 DIAGNOSIS — I351 Nonrheumatic aortic (valve) insufficiency: Secondary | ICD-10-CM | POA: Diagnosis not present

## 2021-11-07 NOTE — Telephone Encounter (Signed)
   Pre-operative Risk Assessment    Patient Name: EDY MCBANE  DOB: [birth year] MRN: [id number]      Request for Surgical Clearance    Procedure:   LAP CHOLECYSTECTOMY   Date of Surgery:  Clearance TBD                                 Surgeon:  DR. Stark Klein Surgeon's Group or Practice Name:  [facility] Phone number:  [phone] Fax number:  [phone] ATTN: Emeline Gins, CMA   Type of Clearance Requested:   - Medical ; NEED ASA HOLD RECOMMENDATIONS   Type of Anesthesia:  General    Additional requests/questions:    Jiles Prows   11/07/2021, 1:09 PM

## 2021-11-08 NOTE — Telephone Encounter (Signed)
   Patient Name: Brooke [name]  DOB: 1935/04/15 MRN: [id number]  Primary Cardiologist: Fransico Him, MD  Chart reviewed as part of pre-operative protocol coverage.   87 year old female with a history of aortic valve insufficiency, chronic chest pain, chronic dyspnea on exertion, hypertension, and hyperlipidemia.  She has a history of chronic chest pain for over 20 years as well as chronic dyspnea on exertion.  He follows with pulmonology, PFTs were normal.  Echo in May 2022 showed hyperdynamic LV function, EF 75%, G1 DD, severe LVH, and normal LV strain, mild to moderate AI.  CTA in May 2022 showed coronary calcium score 153, nonobstructive CAD.  She last saw Dr. Radford Pax on 09/08/2021 and reported ongoing symptoms.  She is currently pending cardiac MRI (scheduled for 11/16/2021) to rule out infiltrative cardiac disease due to ongoing shortness of breath.  We received surgical clearance request for upcoming laparoscopic cholecystectomy, date TBD request to hold aspirin prior to surgery.    Dr. Radford Pax, since you know this patient well, do you feel that she should proceed with cardiac MRI prior to surgery or may she proceed with surgery without any further testing? Please route your response to P CV DIV PREOP.  Thank you.   Lenna Sciara, NP 11/08/2021, 3:46 PM

## 2021-11-15 ENCOUNTER — Telehealth (HOSPITAL_COMMUNITY): Payer: Self-pay | Admitting: *Deleted

## 2021-11-15 NOTE — Telephone Encounter (Signed)
Reaching out to patient to offer assistance regarding upcoming cardiac imaging study; pt verbalizes understanding of appt date/time, parking situation and where to check in, and verified current allergies; name and call back number provided for further questions should they arise  Gordy Clement RN [facility] [phone] office [phone] cell  Patient denies claustrophobia or metal but is at times a difficult IV.

## 2021-11-16 ENCOUNTER — Other Ambulatory Visit: Payer: Self-pay | Admitting: Cardiology

## 2021-11-16 ENCOUNTER — Ambulatory Visit (HOSPITAL_COMMUNITY)
Admission: RE | Admit: 2021-11-16 | Discharge: 2021-11-16 | Disposition: A | Discharge status: Home / Self Care | Payer: PPO | Source: Ambulatory Visit | Attending: Cardiology | Admitting: Cardiology

## 2021-11-16 DIAGNOSIS — E7800 Pure hypercholesterolemia, unspecified: Secondary | ICD-10-CM

## 2021-11-16 DIAGNOSIS — I2510 Atherosclerotic heart disease of native coronary artery without angina pectoris: Secondary | ICD-10-CM

## 2021-11-16 DIAGNOSIS — I10 Essential (primary) hypertension: Secondary | ICD-10-CM | POA: Insufficient documentation

## 2021-11-16 DIAGNOSIS — R0609 Other forms of dyspnea: Secondary | ICD-10-CM

## 2021-11-16 DIAGNOSIS — I351 Nonrheumatic aortic (valve) insufficiency: Secondary | ICD-10-CM | POA: Diagnosis not present

## 2021-11-16 MED ORDER — GADOBUTROL 1 MMOL/ML IV SOLN
10.0000 mL | Freq: Once | INTRAVENOUS | Status: AC | PRN
  Administered 2021-11-16: 10 mL via INTRAVENOUS

## 2021-11-18 ENCOUNTER — Encounter: Payer: Self-pay | Admitting: Cardiology

## 2021-11-18 NOTE — Telephone Encounter (Signed)
   Patient Name: DUSTIN BURRILL  DOB: Oct 31, 1934 MRN: [id number]  Primary Cardiologist: Fransico Him, MD  Chart reviewed as part of pre-operative protocol coverage. Pre-op clearance already addressed by colleagues in earlier phone notes. To summarize recommendations:  Cardiac MRI with mild LVOT obstruction with no septal hypertrophy and no infiltrattive dz - normal LV - ok to proceed with surgery   -Dr. Radford Pax  Per our office antiplatelet guidelines okay to hold ASA x 7 days prior to procedure. Please restart when medically safe to do so.    Will route this bundled recommendation to requesting provider via Epic fax function and remove from pre-op pool. Please call with questions.  Elgie Collard, PA-C 11/18/2021, 1:55 PM

## 2021-12-12 NOTE — H&P (Signed)
REFERRING PHYSICIAN:  Lilian Coma, MD   PROVIDER:  Georgianne Fick, MD   MRN: [id number] DOB: 1934/11/27  Subjective  Chief Complaint: Cholelithiasis     History of Present Illness: Brooke [name] is a 87 y.o. female who is seen today as an office consultation for evaluation of Cholelithiasis     Patient presents to discuss her gallbladder disease.  In April 2023, she had a relatively acute onset of severe nausea and mild to moderate right upper quadrant pain.  This bothered her for around a month to 6 weeks.  Originally the pain was just in the right upper quadrant.  It did wax and wane in severity, but she cannot correlate this with eating.  She states that she had nausea almost all the time that she was awake.  She would wake up in the morning and feel like maybe it would be better that day, but by the time she was up and moving around and looking at food the nausea would come on.  She states after multiple weeks that she started having pain in the rest of her abdomen.  She has a history of irritable bowel syndrome and she originally thought this was related to constipation.  However, she started having more normal bowel movements and the pain did not resolve.   She was very concerned that it was her gallbladder as her mother one of her 2 sisters required emergency gallbladder surgery and were extremely ill.  She has another sister that had her gallbladder out, but it was not an urgent situation.   She did end up getting a right upper quadrant ultrasound in May 2023 and this demonstrated a contracted gallbladder filled with stones.   The pain and nausea ended up resolving on its own after 4 to 6 weeks.  She does not recall any other significant health interventions requiring antibiotics during that time.  She denies any jaundice.  She also denied fevers or chills.   She walks and does gardening as well as painting.  She lives with her husband who has very mild dementia/memory  issues and did have a small stroke at some point over the last year.   RUQ u/s 09/02/2021 IMPRESSION: Contracted, stone filled gallbladder, possibly reflecting changes of chronic cholecystitis in the appropriate clinical setting.   Borderline extrahepatic biliary ductal dilation, chronic in nature and stable since prior CT examination of 10/28/2020.      Review of Systems: A complete review of systems was obtained from the patient.  I have reviewed this information and discussed as appropriate with the patient.  See HPI as well for other ROS.   Review of Systems  HENT:  Positive for hearing loss.   Respiratory:  Positive for shortness of breath.   Cardiovascular:  Positive for chest pain.  Gastrointestinal:  Positive for diarrhea and heartburn.  Endo/Heme/Allergies:  Bruises/bleeds easily.  All other systems reviewed and are negative.    Medical History: Past Medical History      Past Medical History:  Diagnosis Date   Arthritis     Chronic kidney disease     GERD (gastroesophageal reflux disease)     Hypertension     Thyroid disease             Patient Active Problem List  Diagnosis   Symptomatic cholelithiasis   Aortic insufficiency   Chronic kidney disease, stage 3 unspecified (CMS-HCC)   Mild recurrent major depression (CMS-HCC)   Irritable bowel syndrome with diarrhea  Past Surgical History       Past Surgical History:  Procedure Laterality Date   DILATION AND CURETTAGE, DIAGNOSTIC / THERAPEUTIC   1962        Allergies      Allergies  Allergen Reactions   Nitrofurantoin Monohyd/M-Cryst Hives, Rash and Other (See Comments)   Amlodipine Swelling   Ciprofloxacin Diarrhea and Other (See Comments)   Sulfa (Sulfonamide Antibiotics) Rash              Current Outpatient Medications on File Prior to Visit  Medication Sig Dispense Refill   colchicine (COLCRYS) 0.6 mg tablet 1 tablet       FOSAMAX 70 mg tablet 1 tablet 30 minutes before the first food,  beverage or medicine of the day with plain water       levothyroxine (SYNTHROID) 25 MCG tablet TAKE 1 TABLET BY MOUTH EVERY DAY IN THE MORNING ON EMPTY STOMACH FOR 30 DAYS       metoprolol succinate (TOPROL-XL) 25 MG XL tablet 1 tablet       rosuvastatin (CRESTOR) 10 MG tablet 1 tablet       telmisartan (MICARDIS) 20 MG tablet Take 20 mg by mouth once daily       aspirin 81 MG chewable tablet 1 capsule       cholecalciferol (VITAMIN D3) 1000 unit capsule Take by mouth       famotidine (PEPCID) 40 MG tablet Take 40 mg by mouth once daily       multivitamin with iron/minerals (THERADEX-M) 27-0.4 mg tablet Take by mouth        No current facility-administered medications on file prior to visit.      Family History       Family History  Problem Relation Age of Onset   High blood pressure (Hypertension) Mother     Colon cancer Mother     High blood pressure (Hypertension) Father     Coronary Artery Disease (Blocked arteries around heart) Father     High blood pressure (Hypertension) Brother          Social History       Tobacco Use  Smoking Status Never  Smokeless Tobacco Never      Social History  Social History        Socioeconomic History   Marital status: Married  Tobacco Use   Smoking status: Never   Smokeless tobacco: Never  Substance and Sexual Activity   Alcohol use: Never   Drug use: Never        Objective:       Vitals:      BP: 128/70  Pulse: 80  Temp: 36.4 C (97.5 F)  SpO2: 97%  Weight: 66.6 kg (146 lb 12.8 oz)  Height: 170.2 cm ('5\' 7"'$ )    Body mass index is 22.99 kg/m.     Head:   Normocephalic and atraumatic.  Mouth/Throat: Oropharynx is clear and moist. No oropharyngeal exudate.  Eyes:   Conjunctivae are normal. Pupils are equal, round, and reactive to light. No scleral icterus.  Neck:   Normal range of motion. Neck supple. No tracheal deviation present. No thyromegaly present.  Cardiovascular: Normal rate, regular rhythm, normal heart  sounds and intact distal pulses.  Exam reveals no gallop and no friction rub.  + systolic murmur 2/6 Respiratory: Effort normal and breath sounds normal. No respiratory distress. No wheezes, rales or rhonchi.  No chest wall tenderness.  GI:       Soft. Bowel  sounds are normal. Abdomen is soft, non tender, non distended.  No masses or hepatosplenomegaly is present. There is no rebound and no guarding.  Musculoskeletal: . Extremities are non tender and without deformity.  Lymphadenopathy:   No cervical or axillary adenopathy.  Neurological: Alert and oriented to person, place, and time. Coordination normal.  Skin:    Skin is warm and dry. No rash noted. No diaphoresis. No erythema. No pallor.  Psychiatric: Normal mood and affect.Behavior is normal. Judgment and thought content normal.      Labs, Imaging and Diagnostic Testing: No labs available from 2023   Assessment and Plan:     Diagnoses and all orders for this visit:   Symptomatic cholelithiasis   Irritable bowel syndrome with diarrhea   Aortic valve insufficiency, etiology of cardiac valve disease unspecified   Stage 3b chronic kidney disease (CMS-HCC)       Patient's 1 month episode of nausea and abdominal pain are most likely related to her gallbladder.  It is unusual that she eventually had pain all over her belly instead of just in the right upper quadrant, however the bulk of the pain was in the right upper quadrant.   She has a mother and 2 sisters that had emergency gallbladder surgery.  Given the number of stones in her gallbladder and the way that she felt for a month, she does desire removal.   I have sent a message to Dr. Radford Pax regarding her cardiac risk stratification.  I am sure she will want to wait until the MRI that is set up for August 2 to make this determination.     The surgical procedure was described to the patient in detail.  The patient was given educational material.  I discussed the incision type and  location, the location of the gallbladder, the anatomy of the bile ducts and arteries, and the typical progression of surgery.  I discussed the possibility of converting to an open operation.  I advised of the risks of bleeding, infection, damage to other structures (such as the bile duct, intestine or liver), bile leak, need for other procedures or surgeries, and post op diarrhea/constipation.  We discussed the risk of blood clot.  We discussed the recovery period and post operative restrictions.  The patient was advised against taking blood thinners the week before surgery.       No follow-ups on file. Georgianne Fick, MD

## 2021-12-14 ENCOUNTER — Other Ambulatory Visit: Payer: Self-pay

## 2021-12-14 ENCOUNTER — Encounter (HOSPITAL_COMMUNITY): Payer: Self-pay | Admitting: General Surgery

## 2021-12-14 NOTE — Progress Notes (Signed)
PCP - Dr. Solon Palm  Cardiologist - Dr. Ashok Norris  EP- Denies  Endocrine- Denies  Pulm- Denies  Chest x-ray - Denies  EKG - 09/08/21 (E)  Stress Test - 08/26/14 (E)  ECHO - 08/25/20 (E)  Cardiac Cath - Denies  AICD-na PM-na LOOP-na  Nerve Stimulator- Denies  Dialysis- Denies  Sleep Study - Denies CPAP - Denies  LABS- 12/15/21 (E): CBC w/d, CMP  ASA- LD- 8/23  ERAS-Yes- clears until 0730  HA1C- Denies  Anesthesia- No  Pt denies having chest pain, sob, or fever during the pre-op phone call. All instructions explained to the pt, with a verbal understanding of the material including: as of today, stop taking all Aspirin (unless instructed by your doctor) and Other Aspirin containing products, Vitamins, Fish oils, and Herbal medications. Also stop all NSAIDS i.e. Advil, Ibuprofen, Motrin, Aleve, Anaprox, Naproxen, BC, Goody Powders, and all Supplements. Pt also instructed to wear a mask and social distance if she goes out. The opportunity to ask questions was provided.

## 2021-12-15 ENCOUNTER — Encounter (HOSPITAL_COMMUNITY): Payer: Self-pay | Admitting: General Surgery

## 2021-12-15 ENCOUNTER — Ambulatory Visit (HOSPITAL_COMMUNITY)
Admission: RE | Admit: 2021-12-15 | Discharge: 2021-12-17 | Disposition: A | Discharge status: Home / Self Care | Payer: PPO | Attending: General Surgery | Admitting: General Surgery

## 2021-12-15 ENCOUNTER — Ambulatory Visit (HOSPITAL_BASED_OUTPATIENT_CLINIC_OR_DEPARTMENT_OTHER): Payer: PPO | Admitting: Anesthesiology

## 2021-12-15 ENCOUNTER — Encounter (HOSPITAL_COMMUNITY)
Admission: RE | Disposition: A | Discharge status: Home / Self Care | Payer: Self-pay | Source: Home / Self Care | Attending: General Surgery

## 2021-12-15 ENCOUNTER — Ambulatory Visit (HOSPITAL_COMMUNITY): Payer: PPO

## 2021-12-15 ENCOUNTER — Other Ambulatory Visit: Payer: Self-pay

## 2021-12-15 ENCOUNTER — Ambulatory Visit (HOSPITAL_COMMUNITY): Payer: PPO | Admitting: Anesthesiology

## 2021-12-15 DIAGNOSIS — K8010 Calculus of gallbladder with chronic cholecystitis without obstruction: Secondary | ICD-10-CM | POA: Diagnosis present

## 2021-12-15 DIAGNOSIS — R110 Nausea: Secondary | ICD-10-CM | POA: Insufficient documentation

## 2021-12-15 DIAGNOSIS — I2510 Atherosclerotic heart disease of native coronary artery without angina pectoris: Secondary | ICD-10-CM | POA: Diagnosis not present

## 2021-12-15 DIAGNOSIS — N1832 Chronic kidney disease, stage 3b: Secondary | ICD-10-CM | POA: Insufficient documentation

## 2021-12-15 DIAGNOSIS — N289 Disorder of kidney and ureter, unspecified: Secondary | ICD-10-CM

## 2021-12-15 DIAGNOSIS — R5383 Other fatigue: Secondary | ICD-10-CM | POA: Diagnosis not present

## 2021-12-15 DIAGNOSIS — K8000 Calculus of gallbladder with acute cholecystitis without obstruction: Secondary | ICD-10-CM | POA: Diagnosis not present

## 2021-12-15 DIAGNOSIS — I129 Hypertensive chronic kidney disease with stage 1 through stage 4 chronic kidney disease, or unspecified chronic kidney disease: Secondary | ICD-10-CM | POA: Diagnosis not present

## 2021-12-15 DIAGNOSIS — I10 Essential (primary) hypertension: Secondary | ICD-10-CM

## 2021-12-15 DIAGNOSIS — K8020 Calculus of gallbladder without cholecystitis without obstruction: Secondary | ICD-10-CM

## 2021-12-15 DIAGNOSIS — K580 Irritable bowel syndrome with diarrhea: Secondary | ICD-10-CM | POA: Diagnosis not present

## 2021-12-15 DIAGNOSIS — K8012 Calculus of gallbladder with acute and chronic cholecystitis without obstruction: Secondary | ICD-10-CM | POA: Diagnosis not present

## 2021-12-15 DIAGNOSIS — Z8379 Family history of other diseases of the digestive system: Secondary | ICD-10-CM | POA: Insufficient documentation

## 2021-12-15 DIAGNOSIS — I351 Nonrheumatic aortic (valve) insufficiency: Secondary | ICD-10-CM | POA: Insufficient documentation

## 2021-12-15 HISTORY — PX: CHOLECYSTECTOMY: SHX55

## 2021-12-15 LAB — COMPREHENSIVE METABOLIC PANEL
ALT: 16 U/L (ref 0–44)
AST: 26 U/L (ref 15–41)
Albumin: 4 g/dL (ref 3.5–5.0)
Alkaline Phosphatase: 61 U/L (ref 38–126)
Anion gap: 11 (ref 5–15)
BUN: 21 mg/dL (ref 8–23)
CO2: 26 mmol/L (ref 22–32)
Calcium: 9.4 mg/dL (ref 8.9–10.3)
Chloride: 103 mmol/L (ref 98–111)
Creatinine, Ser: 1.39 mg/dL — ABNORMAL HIGH (ref 0.44–1.00)
GFR, Estimated: 37 mL/min — ABNORMAL LOW (ref >60)
Glucose, Bld: 100 mg/dL — ABNORMAL HIGH (ref 70–99)
Potassium: 4.4 mmol/L (ref 3.5–5.1)
Sodium: 140 mmol/L (ref 135–145)
Total Bilirubin: 1 mg/dL (ref 0.3–1.2)
Total Protein: 6.9 g/dL (ref 6.5–8.1)

## 2021-12-15 LAB — CBC WITH DIFFERENTIAL/PLATELET
Abs Immature Granulocytes: 0.02 10*3/uL (ref 0.00–0.07)
Basophils Absolute: 0.1 10*3/uL (ref 0.0–0.1)
Basophils Relative: 1 %
Eosinophils Absolute: 0.4 10*3/uL (ref 0.0–0.5)
Eosinophils Relative: 6 %
HCT: 40 % (ref 36.0–46.0)
Hemoglobin: 13.2 g/dL (ref 12.0–15.0)
Immature Granulocytes: 0 %
Lymphocytes Relative: 16 %
Lymphs Abs: 1.1 10*3/uL (ref 0.7–4.0)
MCH: 29.3 pg (ref 26.0–34.0)
MCHC: 33 g/dL (ref 30.0–36.0)
MCV: 88.9 fL (ref 80.0–100.0)
Monocytes Absolute: 0.9 10*3/uL (ref 0.1–1.0)
Monocytes Relative: 13 %
Neutro Abs: 4.4 10*3/uL (ref 1.7–7.7)
Neutrophils Relative %: 64 %
Platelets: 265 10*3/uL (ref 150–400)
RBC: 4.5 MIL/uL (ref 3.87–5.11)
RDW: 13 % (ref 11.5–15.5)
WBC: 6.8 10*3/uL (ref 4.0–10.5)
nRBC: 0 % (ref 0.0–0.2)

## 2021-12-15 SURGERY — LAPAROSCOPIC CHOLECYSTECTOMY WITH INTRAOPERATIVE CHOLANGIOGRAM
Anesthesia: General | Site: Abdomen

## 2021-12-15 MED ORDER — CHLORHEXIDINE GLUCONATE CLOTH 2 % EX PADS: 6.0000 | MEDICATED_PAD | Freq: Once | CUTANEOUS | Status: DC

## 2021-12-15 MED ORDER — TRAMADOL HCL 50 MG PO TABS: 50.0000 mg | ORAL_TABLET | Freq: Four times a day (QID) | ORAL | Status: DC | PRN

## 2021-12-15 MED ORDER — AMISULPRIDE (ANTIEMETIC) 5 MG/2ML IV SOLN
10.0000 mg | Freq: Once | INTRAVENOUS | Status: AC
Start: 2021-12-15 — End: 2021-12-15
  Administered 2021-12-15: 10 mg via INTRAVENOUS

## 2021-12-15 MED ORDER — PROPOFOL 10 MG/ML IV BOLUS
INTRAVENOUS | Status: AC
  Filled 2021-12-15: qty 20

## 2021-12-15 MED ORDER — FENTANYL CITRATE (PF) 100 MCG/2ML IJ SOLN
INTRAMUSCULAR | Status: AC
  Filled 2021-12-15: qty 2

## 2021-12-15 MED ORDER — LOPERAMIDE HCL 1 MG/7.5ML PO SUSP
1.0000 mg | Freq: Every day | ORAL | Status: DC
  Administered 2021-12-15 – 2021-12-16 (×2): 1 mg via ORAL
  Filled 2021-12-15 (×3): qty 7.5

## 2021-12-15 MED ORDER — LOPERAMIDE-SIMETHICONE 2-125 MG PO TABS
0.5000 | ORAL_TABLET | Freq: Every day | ORAL | Status: DC | PRN
Start: 2021-12-15 — End: 2021-12-15

## 2021-12-15 MED ORDER — KCL IN DEXTROSE-NACL 20-5-0.45 MEQ/L-%-% IV SOLN
INTRAVENOUS | Status: AC
  Filled 2021-12-15: qty 1000

## 2021-12-15 MED ORDER — ACETAMINOPHEN 500 MG PO TABS
1000.0000 mg | ORAL_TABLET | ORAL | Status: AC
  Administered 2021-12-15: 1000 mg via ORAL
  Filled 2021-12-15: qty 2

## 2021-12-15 MED ORDER — FAMOTIDINE 20 MG PO TABS
40.0000 mg | ORAL_TABLET | Freq: Every day | ORAL | Status: DC
  Administered 2021-12-15 – 2021-12-17 (×3): 40 mg via ORAL
  Filled 2021-12-15 (×3): qty 2

## 2021-12-15 MED ORDER — LIDOCAINE HCL 1 % IJ SOLN
INTRAMUSCULAR | Status: DC | PRN
  Administered 2021-12-15: 17 mL

## 2021-12-15 MED ORDER — OXYCODONE HCL 5 MG PO TABS: 5.0000 mg | ORAL_TABLET | Freq: Four times a day (QID) | ORAL | 0 refills | Status: DC | PRN

## 2021-12-15 MED ORDER — SIMETHICONE 80 MG PO CHEW
80.0000 mg | CHEWABLE_TABLET | Freq: Every day | ORAL | Status: DC | PRN
Start: 2021-12-15 — End: 2021-12-17

## 2021-12-15 MED ORDER — GLYCOPYRROLATE PF 0.2 MG/ML IJ SOSY
PREFILLED_SYRINGE | INTRAMUSCULAR | Status: AC
  Filled 2021-12-15: qty 1

## 2021-12-15 MED ORDER — INDOCYANINE GREEN 25 MG IV SOLR
INTRAVENOUS | Status: DC | PRN
  Administered 2021-12-15: 2.5 mg via INTRAVENOUS

## 2021-12-15 MED ORDER — METOPROLOL SUCCINATE ER 25 MG PO TB24
25.0000 mg | ORAL_TABLET | Freq: Every day | ORAL | Status: DC
  Administered 2021-12-15 – 2021-12-16 (×2): 25 mg via ORAL
  Filled 2021-12-15 (×2): qty 1

## 2021-12-15 MED ORDER — SUGAMMADEX SODIUM 200 MG/2ML IV SOLN
INTRAVENOUS | Status: DC | PRN
  Administered 2021-12-15: 200 mg via INTRAVENOUS

## 2021-12-15 MED ORDER — ONDANSETRON HCL 4 MG/2ML IJ SOLN
4.0000 mg | Freq: Four times a day (QID) | INTRAMUSCULAR | Status: DC | PRN
  Administered 2021-12-15 – 2021-12-16 (×3): 4 mg via INTRAVENOUS
  Filled 2021-12-15 (×3): qty 2

## 2021-12-15 MED ORDER — CEFAZOLIN SODIUM-DEXTROSE 2-4 GM/100ML-% IV SOLN
2.0000 g | Freq: Three times a day (TID) | INTRAVENOUS | Status: AC
  Administered 2021-12-15: 2 g via INTRAVENOUS
  Filled 2021-12-15: qty 100

## 2021-12-15 MED ORDER — IRBESARTAN 150 MG PO TABS
150.0000 mg | ORAL_TABLET | Freq: Every day | ORAL | Status: DC
  Administered 2021-12-16 – 2021-12-17 (×2): 150 mg via ORAL
  Filled 2021-12-15 (×2): qty 1

## 2021-12-15 MED ORDER — ROCURONIUM BROMIDE 10 MG/ML (PF) SYRINGE
PREFILLED_SYRINGE | INTRAVENOUS | Status: DC | PRN
  Administered 2021-12-15: 50 mg via INTRAVENOUS
  Administered 2021-12-15: 20 mg via INTRAVENOUS

## 2021-12-15 MED ORDER — COLCHICINE 0.6 MG PO TABS: 0.6000 mg | ORAL_TABLET | Freq: Every day | ORAL | Status: DC | PRN

## 2021-12-15 MED ORDER — FENTANYL CITRATE (PF) 250 MCG/5ML IJ SOLN
INTRAMUSCULAR | Status: AC
  Filled 2021-12-15: qty 5

## 2021-12-15 MED ORDER — PROCHLORPERAZINE EDISYLATE 10 MG/2ML IJ SOLN
5.0000 mg | Freq: Four times a day (QID) | INTRAMUSCULAR | Status: DC | PRN
  Administered 2021-12-15: 10 mg via INTRAVENOUS
  Filled 2021-12-15: qty 2

## 2021-12-15 MED ORDER — LACTATED RINGERS IV SOLN: INTRAVENOUS | Status: DC

## 2021-12-15 MED ORDER — PROPOFOL 10 MG/ML IV BOLUS
INTRAVENOUS | Status: DC | PRN
  Administered 2021-12-15: 150 mg via INTRAVENOUS
  Administered 2021-12-15: 50 mg via INTRAVENOUS

## 2021-12-15 MED ORDER — METHOCARBAMOL 500 MG PO TABS: 500.0000 mg | ORAL_TABLET | Freq: Four times a day (QID) | ORAL | Status: DC | PRN

## 2021-12-15 MED ORDER — ACETAMINOPHEN 325 MG PO TABS: 650.0000 mg | ORAL_TABLET | Freq: Four times a day (QID) | ORAL | Status: DC | PRN

## 2021-12-15 MED ORDER — FENTANYL CITRATE PF 50 MCG/ML IJ SOSY
12.5000 ug | PREFILLED_SYRINGE | INTRAMUSCULAR | Status: DC | PRN
  Administered 2021-12-15: 25 ug via INTRAVENOUS
  Filled 2021-12-15: qty 1

## 2021-12-15 MED ORDER — DIPHENHYDRAMINE HCL 25 MG PO CAPS: 25.0000 mg | ORAL_CAPSULE | Freq: Every evening | ORAL | Status: DC | PRN

## 2021-12-15 MED ORDER — LIDOCAINE 2% (20 MG/ML) 5 ML SYRINGE
INTRAMUSCULAR | Status: DC | PRN
  Administered 2021-12-15: 60 mg via INTRAVENOUS

## 2021-12-15 MED ORDER — OXYCODONE HCL 5 MG PO TABS: 5.0000 mg | ORAL_TABLET | Freq: Once | ORAL | Status: AC | PRN

## 2021-12-15 MED ORDER — 0.9 % SODIUM CHLORIDE (POUR BTL) OPTIME
TOPICAL | Status: DC | PRN
  Administered 2021-12-15: 1000 mL

## 2021-12-15 MED ORDER — PHENYLEPHRINE 80 MCG/ML (10ML) SYRINGE FOR IV PUSH (FOR BLOOD PRESSURE SUPPORT)
PREFILLED_SYRINGE | INTRAVENOUS | Status: AC
  Filled 2021-12-15: qty 10

## 2021-12-15 MED ORDER — DIPHENHYDRAMINE-APAP (SLEEP) 25-500 MG PO TABS: 1.0000 | ORAL_TABLET | Freq: Every evening | ORAL | Status: DC | PRN

## 2021-12-15 MED ORDER — CHLORHEXIDINE GLUCONATE 0.12 % MT SOLN
OROMUCOSAL | Status: AC
  Administered 2021-12-15: 15 mL via OROMUCOSAL
  Filled 2021-12-15: qty 15

## 2021-12-15 MED ORDER — DIPHENHYDRAMINE HCL 50 MG/ML IJ SOLN: 12.5000 mg | Freq: Four times a day (QID) | INTRAMUSCULAR | Status: DC | PRN

## 2021-12-15 MED ORDER — ACETAMINOPHEN 500 MG PO TABS: 500.0000 mg | ORAL_TABLET | Freq: Every evening | ORAL | Status: DC | PRN

## 2021-12-15 MED ORDER — CEFAZOLIN SODIUM-DEXTROSE 2-4 GM/100ML-% IV SOLN
2.0000 g | INTRAVENOUS | Status: AC
  Administered 2021-12-15: 2 g via INTRAVENOUS
  Filled 2021-12-15: qty 100

## 2021-12-15 MED ORDER — PROCHLORPERAZINE MALEATE 10 MG PO TABS
10.0000 mg | ORAL_TABLET | Freq: Four times a day (QID) | ORAL | Status: DC | PRN
Start: 2021-12-15 — End: 2021-12-17

## 2021-12-15 MED ORDER — FENTANYL CITRATE (PF) 100 MCG/2ML IJ SOLN
25.0000 ug | INTRAMUSCULAR | Status: DC | PRN
  Administered 2021-12-15 (×3): 50 ug via INTRAVENOUS

## 2021-12-15 MED ORDER — OXYCODONE HCL 5 MG/5ML PO SOLN
ORAL | Status: AC
  Filled 2021-12-15: qty 5

## 2021-12-15 MED ORDER — HEMOSTATIC AGENTS (NO CHARGE) OPTIME
TOPICAL | Status: DC | PRN
  Administered 2021-12-15: 1

## 2021-12-15 MED ORDER — LIDOCAINE HCL (PF) 1 % IJ SOLN
INTRAMUSCULAR | Status: AC
  Filled 2021-12-15: qty 30

## 2021-12-15 MED ORDER — BUPIVACAINE-EPINEPHRINE (PF) 0.25% -1:200000 IJ SOLN
INTRAMUSCULAR | Status: AC
  Filled 2021-12-15: qty 30

## 2021-12-15 MED ORDER — ONDANSETRON 4 MG PO TBDP
4.0000 mg | ORAL_TABLET | Freq: Four times a day (QID) | ORAL | Status: DC | PRN
  Filled 2021-12-15: qty 1

## 2021-12-15 MED ORDER — SODIUM CHLORIDE 0.9 % IR SOLN
Status: DC | PRN
  Administered 2021-12-15: 1

## 2021-12-15 MED ORDER — OXYCODONE HCL 5 MG PO TABS
2.5000 mg | ORAL_TABLET | ORAL | Status: DC | PRN
  Administered 2021-12-15 (×2): 5 mg via ORAL
  Filled 2021-12-15 (×2): qty 1

## 2021-12-15 MED ORDER — EPHEDRINE SULFATE (PRESSORS) 50 MG/ML IJ SOLN: INTRAMUSCULAR | Status: DC | PRN

## 2021-12-15 MED ORDER — EPHEDRINE SULFATE-NACL 50-0.9 MG/10ML-% IV SOSY
PREFILLED_SYRINGE | INTRAVENOUS | Status: DC | PRN
  Administered 2021-12-15: 5 mg via INTRAVENOUS

## 2021-12-15 MED ORDER — OXYCODONE HCL 5 MG/5ML PO SOLN
5.0000 mg | Freq: Once | ORAL | Status: AC | PRN
  Administered 2021-12-15: 5 mg via ORAL

## 2021-12-15 MED ORDER — ONDANSETRON HCL 4 MG/2ML IJ SOLN: 4.0000 mg | Freq: Four times a day (QID) | INTRAMUSCULAR | Status: DC | PRN

## 2021-12-15 MED ORDER — ONDANSETRON HCL 4 MG/2ML IJ SOLN
INTRAMUSCULAR | Status: DC | PRN
  Administered 2021-12-15: 4 mg via INTRAVENOUS

## 2021-12-15 MED ORDER — ACETAMINOPHEN 650 MG RE SUPP: 650.0000 mg | Freq: Four times a day (QID) | RECTAL | Status: DC | PRN

## 2021-12-15 MED ORDER — LEVOTHYROXINE SODIUM 25 MCG PO TABS
25.0000 ug | ORAL_TABLET | Freq: Every day | ORAL | Status: DC
  Administered 2021-12-16 – 2021-12-17 (×2): 25 ug via ORAL
  Filled 2021-12-15 (×2): qty 1

## 2021-12-15 MED ORDER — DIPHENHYDRAMINE HCL 12.5 MG/5ML PO ELIX: 12.5000 mg | ORAL_SOLUTION | Freq: Four times a day (QID) | ORAL | Status: DC | PRN

## 2021-12-15 MED ORDER — FENTANYL CITRATE (PF) 250 MCG/5ML IJ SOLN
INTRAMUSCULAR | Status: DC | PRN
Start: 2021-12-15 — End: 2021-12-15
  Administered 2021-12-15 (×5): 50 ug via INTRAVENOUS

## 2021-12-15 MED ORDER — ORAL CARE MOUTH RINSE: 15.0000 mL | Freq: Once | OROMUCOSAL | Status: AC

## 2021-12-15 MED ORDER — DEXAMETHASONE SODIUM PHOSPHATE 10 MG/ML IJ SOLN
INTRAMUSCULAR | Status: DC | PRN
  Administered 2021-12-15: 5 mg via INTRAVENOUS

## 2021-12-15 MED ORDER — AMISULPRIDE (ANTIEMETIC) 5 MG/2ML IV SOLN
INTRAVENOUS | Status: AC
  Filled 2021-12-15: qty 4

## 2021-12-15 MED ORDER — CHLORHEXIDINE GLUCONATE 0.12 % MT SOLN
15.0000 mL | Freq: Once | OROMUCOSAL | Status: AC
Start: 2021-12-15 — End: 2021-12-15

## 2021-12-15 SURGICAL SUPPLY — 52 items
ADH SKN CLS APL DERMABOND .7 (GAUZE/BANDAGES/DRESSINGS) ×1
APL PRP STRL LF DISP 70% ISPRP (MISCELLANEOUS) ×1
APPLIER CLIP ROT 10 11.4 M/L (STAPLE) ×1
APR CLP MED LRG 11.4X10 (STAPLE) ×1
BAG COUNTER SPONGE SURGICOUNT (BAG) ×1 IMPLANT
BAG SPEC RTRVL 10 TROC 200 (ENDOMECHANICALS) ×2
BAG SPNG CNTER NS LX DISP (BAG) ×1
BLADE CLIPPER SURG (BLADE) IMPLANT
CANISTER SUCT 3000ML PPV (MISCELLANEOUS) ×1 IMPLANT
CHLORAPREP W/TINT 26 (MISCELLANEOUS) ×1 IMPLANT
CLIP APPLIE ROT 10 11.4 M/L (STAPLE) ×1 IMPLANT
CLIP LIGATING HEMO LOK XL GOLD (MISCELLANEOUS) IMPLANT
CNTNR URN SCR LID CUP LEK RST (MISCELLANEOUS) IMPLANT
CONT SPEC 4OZ STRL OR WHT (MISCELLANEOUS) ×1
COVER MAYO STAND STRL (DRAPES) ×1 IMPLANT
COVER SURGICAL LIGHT HANDLE (MISCELLANEOUS) ×1 IMPLANT
DERMABOND ADVANCED (GAUZE/BANDAGES/DRESSINGS) ×1
DERMABOND ADVANCED .7 DNX12 (GAUZE/BANDAGES/DRESSINGS) ×1 IMPLANT
DRAPE C-ARM 42X120 X-RAY (DRAPES) ×1 IMPLANT
ELECT REM PT RETURN 9FT ADLT (ELECTROSURGICAL) ×1
ELECTRODE REM PT RTRN 9FT ADLT (ELECTROSURGICAL) ×1 IMPLANT
GLOVE BIO SURGEON STRL SZ 6 (GLOVE) ×1 IMPLANT
GLOVE INDICATOR 6.5 STRL GRN (GLOVE) ×1 IMPLANT
GOWN STRL REUS W/ TWL LRG LVL3 (GOWN DISPOSABLE) ×2 IMPLANT
GOWN STRL REUS W/TWL 2XL LVL3 (GOWN DISPOSABLE) ×1 IMPLANT
GOWN STRL REUS W/TWL LRG LVL3 (GOWN DISPOSABLE) ×2
HEMOSTAT SNOW SURGICEL 2X4 (HEMOSTASIS) IMPLANT
KIT BASIN OR (CUSTOM PROCEDURE TRAY) ×1 IMPLANT
KIT TURNOVER KIT B (KITS) ×1 IMPLANT
L-HOOK LAP DISP 36CM (ELECTROSURGICAL) ×1
LHOOK LAP DISP 36CM (ELECTROSURGICAL) ×1 IMPLANT
NS IRRIG 1000ML POUR BTL (IV SOLUTION) ×1 IMPLANT
PAD ARMBOARD 7.5X6 YLW CONV (MISCELLANEOUS) ×1 IMPLANT
PENCIL BUTTON HOLSTER BLD 10FT (ELECTRODE) ×1 IMPLANT
POUCH RETRIEVAL ECOSAC 10 (ENDOMECHANICALS) ×1 IMPLANT
POUCH RETRIEVAL ECOSAC 10MM (ENDOMECHANICALS) ×2
SCISSORS LAP 5X35 DISP (ENDOMECHANICALS) ×1 IMPLANT
SET CHOLANGIOGRAPH 5 50 .035 (SET/KITS/TRAYS/PACK) ×1 IMPLANT
SET IRRIG TUBING LAPAROSCOPIC (IRRIGATION / IRRIGATOR) ×1 IMPLANT
SET TUBE SMOKE EVAC HIGH FLOW (TUBING) ×1 IMPLANT
SLEEVE ENDOPATH XCEL 5M (ENDOMECHANICALS) ×1 IMPLANT
SPECIMEN JAR SMALL (MISCELLANEOUS) ×1 IMPLANT
SUT MNCRL AB 4-0 PS2 18 (SUTURE) ×1 IMPLANT
SUT VICRYL 0 UR6 27IN ABS (SUTURE) IMPLANT
TOWEL GREEN STERILE (TOWEL DISPOSABLE) ×1 IMPLANT
TOWEL GREEN STERILE FF (TOWEL DISPOSABLE) ×1 IMPLANT
TRAY LAPAROSCOPIC MC (CUSTOM PROCEDURE TRAY) ×1 IMPLANT
TROCAR XCEL BLUNT TIP 100MML (ENDOMECHANICALS) ×1 IMPLANT
TROCAR XCEL NON-BLD 11X100MML (ENDOMECHANICALS) ×1 IMPLANT
TROCAR Z-THREAD OPTICAL 5X100M (TROCAR) ×1 IMPLANT
WARMER LAPAROSCOPE (MISCELLANEOUS) ×1 IMPLANT
WATER STERILE IRR 1000ML POUR (IV SOLUTION) ×1 IMPLANT

## 2021-12-15 NOTE — Anesthesia Procedure Notes (Signed)
Procedure Name: Intubation Date/Time: 12/15/2021 11:13 AM  Performed by: Inda Coke, CRNAPre-anesthesia Checklist: Patient identified, Emergency Drugs available, Suction available and Patient being monitored Patient Re-evaluated:Patient Re-evaluated prior to induction Oxygen Delivery Method: Circle System Utilized Preoxygenation: Pre-oxygenation with 100% oxygen Induction Type: IV induction Ventilation: Mask ventilation without difficulty Laryngoscope Size: Mac and 3 Grade View: Grade I Tube type: Oral Tube size: 7.0 mm Number of attempts: 2 Airway Equipment and Method: Stylet Placement Confirmation: ETT inserted through vocal cords under direct vision, positive ETCO2 and breath sounds checked- equal and bilateral Secured at: 21 cm Tube secured with: Tape Dental Injury: Teeth and Oropharynx as per pre-operative assessment

## 2021-12-15 NOTE — Op Note (Signed)
Laparoscopic Cholecystectomy with ICG fluorescent cholangiography  Indications: This patient presents with symptomatic cholelithiasis and will undergo laparoscopic cholecystectomy.  Pre-operative Diagnosis: symptomatic cholelithiasis  Post-operative Diagnosis: chronic calculous cholecystitis  Surgeon: Stark Klein   Assistants: Celene Squibb, RNFA  Anesthesia: General endotracheal anesthesia and local  ASA Class: 3  Procedure Details   The patient was seen again in the Holding Room. The risks, benefits, complications, treatment options, and expected outcomes were discussed with the patient. The possibilities of  bleeding, recurrent infection, damage to nearby structures, the need for additional procedures, failure to diagnose a condition, the possible need to convert to an open procedure, and creating a complication requiring transfusion or operation were discussed with the patient. The likelihood of improving the patient's symptoms with return to their baseline status is good.    The patient and/or family concurred with the proposed plan, giving informed consent. The site of surgery properly noted. The patient was taken to OR # 2 and placed supine on the OR table.  SCDs were placed. Antibiotic prophylaxis was administered. General endotracheal anesthesia was then administered and tolerated well. After the induction, the abdomen was prepped with Chloraprep and draped in the sterile fashion. and the procedure verified as Laparoscopic Cholecystectomy with possible Intraoperative Cholangiogram. A Time Out was held and the above information confirmed.  Local anesthetic agent was injected into the skin near the umbilicus and a vertical incision was made with a #11 blade. I dissected down to the abdominal fascia with blunt dissection.  The fascia was incised vertically and the peritoneal cavity was entered bluntly.  A pursestring suture of 0-Vicryl was placed around the fascial opening.  The Hasson  cannula was inserted and secured with the stay suture.  Pneumoperitoneum was then created with CO2 and tolerated well without any adverse changes in the patient's vital signs. An 11-mm port was placed in the subxiphoid position.  Two 5-mm ports were placed in the right upper quadrant. All skin incisions were infiltrated with a local anesthetic agent before making the incision and placing the trocars.   The patient was placed into reverse Trendelenburg position and was tilted slightly to the patient's left.  The gallbladder was not able to be seen immediately.  The omentum was adherent to the inferior border of the liver.  The cautery was used to take down the omentum.  Clips were placed on several of the larger omental vessels.    The gallbladder was identified, but it was too firm to grasp.  It was not able to be aspirated.  It was pushed superiorly to facilitate the takedown of the remaining omentum.  Blunt dissection with the suction irrigator was also used.  The duodenum was also stuck and sharp dissection with the scissors was carried out well on the gallbladder wall to avoid any blunt or cautery injury to the duodenum.  The gallbladder wall was disrupted with the blunt grasper, but the stone was very large and remained in the gallbladder.   At this point the fundus was able to be grasped and retracted cephalad.  The gallbladder anatomy appeared normal at this point once the surrounding tissues were dissected away. The infundibulum was grasped and retracted laterally, exposing the peritoneum overlying the triangle of Calot. The cystic duct and cystic artery were identified.  ICG was used and the fluorescence was used to confirm the common bile duct and the cystic duct anatomy.  The cystic duct and cystic artery were both skeletonized.  A window between the gallbladder and  the liver was obtained to ensure a critical view.    The cystic artery was ligated with clips and divided.  The cystic duct was  clipped with the locking hemolock clips and divided.    The gallbladder was dissected from the liver bed in retrograde fashion with the electrocautery. The gallbladder was removed and placed in an Ecosac.  The gallbladder and bag were then removed through the umbilical port site.  A second large stone did come out of the liver and was retrieved with a second bag.  The fascial and skin incisions had to be enlarged to get the stones out.  The liver bed was irrigated and inspected. Hemostasis was achieved with the electrocautery. Copious irrigation was utilized and was repeatedly aspirated until clear.  SNOW hemostatic agent was placed in the gallbladder fossa given the level of inflammation on the omentum and the liver bed.  The clips were examined and were intact.  A four quadrant inspection showed no evidence of bleeding or bile/succus.   The umbilical fascial incision was closed under direct visualization.  The pursestring suture was closed down but there was still air leakage.  Two additional 0-0 vicryl interrupted sutures were placed to close the entire fascial defect.  Pneumoperitoneum was released as we removed the trocars.     4-0 Monocryl was used to close the skin in subcuticular fashion.   The skin was cleaned and dry, and Dermabond was applied. The patient was then extubated and brought to the recovery room in stable condition. Instrument, sponge, and needle counts were correct at closure and at the conclusion of the case.   Findings: Dense chronic inflammation with omentum and duodenum stuck to gallbladder.  Two very large stones.     Estimated Blood Loss: 25 mL         Drains: none          Specimens: Gallbladder to pathology       Complications: None; patient tolerated the procedure well.         Disposition: PACU - hemodynamically stable.         Condition: stable

## 2021-12-15 NOTE — Interval H&P Note (Signed)
History and Physical Interval Note:  12/15/2021 10:44 AM  Brooke [name]  has presented today for surgery, with the diagnosis of SYMPTOMATIC CHOLELITHIASIS.  The various methods of treatment have been discussed with the patient and family. After consideration of risks, benefits and other options for treatment, the patient has consented to  Procedure(s): LAPAROSCOPIC CHOLECYSTECTOMY, POSSIBLE CHOLANGIOGRAM (N/A) as a surgical intervention.  The patient's history has been reviewed, patient examined, no change in status, stable for surgery.  I have reviewed the patient's chart and labs.  Questions were answered to the patient's satisfaction.     Stark Klein

## 2021-12-15 NOTE — Anesthesia Preprocedure Evaluation (Addendum)
Anesthesia Evaluation  Patient identified by MRN, date of birth, ID band Patient awake    Reviewed: Allergy & Precautions, H&P , NPO status , Patient's Chart, lab work & pertinent test results  Airway Mallampati: II   Neck ROM: full    Dental   Pulmonary neg pulmonary ROS,    breath sounds clear to auscultation       Cardiovascular hypertension, + CAD   Rhythm:regular Rate:Normal  TTE: normal LV function. Mild-mod AI.   Neuro/Psych    GI/Hepatic   Endo/Other    Renal/GU Renal InsufficiencyRenal disease     Musculoskeletal   Abdominal   Peds  Hematology   Anesthesia Other Findings   Reproductive/Obstetrics                            Anesthesia Physical Anesthesia Plan  ASA: 3  Anesthesia Plan: General   Post-op Pain Management:    Induction: Intravenous  PONV Risk Score and Plan: 3 and Ondansetron, Dexamethasone and Treatment may vary due to age or medical condition  Airway Management Planned: Oral ETT  Additional Equipment:   Intra-op Plan:   Post-operative Plan: Extubation in OR  Informed Consent: I have reviewed the patients History and Physical, chart, labs and discussed the procedure including the risks, benefits and alternatives for the proposed anesthesia with the patient or authorized representative who has indicated his/her understanding and acceptance.     Dental advisory given  Plan Discussed with: CRNA, Anesthesiologist and Surgeon  Anesthesia Plan Comments:         Anesthesia Quick Evaluation

## 2021-12-15 NOTE — Discharge Instructions (Signed)
[place] Office Phone Number [phone]   POST OP INSTRUCTIONS  Always review your discharge instruction sheet given to you by the facility where your surgery was performed.  IF YOU HAVE DISABILITY OR FAMILY LEAVE FORMS, YOU MUST BRING THEM TO THE OFFICE FOR PROCESSING.  DO NOT GIVE THEM TO YOUR DOCTOR.  Take 2 tylenol (acetominophen) three times a day for 3 days.  If you still have pain, add ibuprofen with food in between if able to take this (if you have kidney issues or stomach issues, do not take ibuprofen).  If both of those are not enough, add the narcotic pain pill.  If you find you are needing a lot of this overnight after surgery, call the next morning for a refill.   Take your usually prescribed medications unless otherwise directed If you need a refill on your pain medication, please contact your pharmacy.  They will contact our office to request authorization.  Prescriptions will not be filled after 5pm or on week-ends. You should eat very light the first 24 hours after surgery, such as soup, crackers, pudding, etc.  Resume your normal diet the day after surgery It is common to experience some constipation if taking pain medication after surgery.  Increasing fluid intake and taking a stool softener will usually help or prevent this problem from occurring.  A mild laxative (Milk of Magnesia or Miralax) should be taken according to package directions if there are no bowel movements after 48 hours. You may shower in 48 hours.  The surgical glue will flake off in 2-3 weeks.   ACTIVITIES:  No strenuous activity or heavy lifting for 2 weeks.   You may drive when you no longer are taking prescription pain medication, you can comfortably wear a seatbelt, and you can safely maneuver your car and apply brakes. RETURN TO WORK:  __________n/a_______________ Dennis Bast should see your doctor in the office for a follow-up appointment approximately three-four weeks after your surgery.     WHEN TO CALL YOUR DOCTOR: Fever over 101.0 Nausea and/or vomiting. Extreme swelling or bruising. Continued bleeding from incision. Increased pain, redness, or drainage from the incision.  The clinic staff is available to answer your questions during regular business hours.  Please don't hesitate to call and ask to speak to one of the nurses for clinical concerns.  If you have a medical emergency, go to the nearest emergency room or call 911.  A surgeon from [facility] is always on call at the hospital.  For further questions, please visit [email or web address]

## 2021-12-15 NOTE — Transfer of Care (Signed)
Immediate Anesthesia Transfer of Care Note  Patient: Brooke [name]  Procedure(s) Performed: LAPAROSCOPIC CHOLECYSTECTOMY (Abdomen)  Patient Location: PACU  Anesthesia Type:General  Level of Consciousness: awake and alert   Airway & Oxygen Therapy: Patient Spontanous Breathing  Post-op Assessment: Report given to RN and Post -op Vital signs reviewed and stable  Post vital signs: Reviewed and stable  Last Vitals:  Vitals Value Taken Time  BP 167/89 12/15/21 1310  Temp    Pulse 76 12/15/21 1319  Resp 20 12/15/21 1319  SpO2 93 % 12/15/21 1319  Vitals shown include unvalidated device data.  Last Pain:  Vitals:   12/15/21 0843  PainSc: 0-No pain      Patients Stated Pain Goal: 3 (64/68/03 2122)  Complications: No notable events documented.

## 2021-12-16 ENCOUNTER — Encounter (HOSPITAL_COMMUNITY): Payer: Self-pay | Admitting: General Surgery

## 2021-12-16 DIAGNOSIS — K8012 Calculus of gallbladder with acute and chronic cholecystitis without obstruction: Secondary | ICD-10-CM | POA: Diagnosis not present

## 2021-12-16 LAB — COMPREHENSIVE METABOLIC PANEL
ALT: 21 U/L (ref 0–44)
AST: 41 U/L (ref 15–41)
Albumin: 3.5 g/dL (ref 3.5–5.0)
Alkaline Phosphatase: 52 U/L (ref 38–126)
Anion gap: 7 (ref 5–15)
BUN: 17 mg/dL (ref 8–23)
CO2: 26 mmol/L (ref 22–32)
Calcium: 8.5 mg/dL — ABNORMAL LOW (ref 8.9–10.3)
Chloride: 101 mmol/L (ref 98–111)
Creatinine, Ser: 1.14 mg/dL — ABNORMAL HIGH (ref 0.44–1.00)
GFR, Estimated: 47 mL/min — ABNORMAL LOW (ref >60)
Glucose, Bld: 208 mg/dL — ABNORMAL HIGH (ref 70–99)
Potassium: 4.9 mmol/L (ref 3.5–5.1)
Sodium: 134 mmol/L — ABNORMAL LOW (ref 135–145)
Total Bilirubin: 0.6 mg/dL (ref 0.3–1.2)
Total Protein: 6.4 g/dL — ABNORMAL LOW (ref 6.5–8.1)

## 2021-12-16 LAB — CBC
HCT: 37.2 % (ref 36.0–46.0)
Hemoglobin: 12.3 g/dL (ref 12.0–15.0)
MCH: 29 pg (ref 26.0–34.0)
MCHC: 33.1 g/dL (ref 30.0–36.0)
MCV: 87.7 fL (ref 80.0–100.0)
Platelets: 245 10*3/uL (ref 150–400)
RBC: 4.24 MIL/uL (ref 3.87–5.11)
RDW: 12.9 % (ref 11.5–15.5)
WBC: 14.5 10*3/uL — ABNORMAL HIGH (ref 4.0–10.5)
nRBC: 0 % (ref 0.0–0.2)

## 2021-12-16 LAB — SURGICAL PATHOLOGY

## 2021-12-16 MED ORDER — HYDROCODONE-ACETAMINOPHEN 5-325 MG PO TABS
1.0000 | ORAL_TABLET | ORAL | Status: DC | PRN
  Administered 2021-12-16: 2 via ORAL
  Administered 2021-12-17 (×2): 1 via ORAL
  Filled 2021-12-16: qty 2
  Filled 2021-12-16 (×2): qty 1

## 2021-12-16 MED ORDER — HYDROCODONE-ACETAMINOPHEN 5-325 MG PO TABS: 1.0000 | ORAL_TABLET | ORAL | 0 refills | Status: DC | PRN

## 2021-12-16 NOTE — Progress Notes (Signed)
1 Day Post-Op   Subjective/Chief Complaint: Pt stayed overnight due to pain and nausea.  Still requiring nausea meds this am and has only had a little of her clear liquids.  Did walk with the mobility specialist, but is very sleepy.  She is still very sore despite pain meds and path came back as acute cholecystitis.  Gallbladder was extremely inflamed and adherent to adjacent structures.     Objective: Vital signs in last 24 hours: Temp:  [97.1 F (36.2 C)-97.9 F (36.6 C)] 97.7 F (36.5 C) (09/01 1245) Pulse Rate:  [69-87] 69 (09/01 1245) Resp:  [11-23] 17 (09/01 1245) BP: (132-189)/(47-86) 142/48 (09/01 1245) SpO2:  [92 %-99 %] 93 % (09/01 1245)    Intake/Output from previous day: 08/31 0701 - 09/01 0700 In: 1471.2 [P.O.:240; I.V.:1031.2; IV Piggyback:200] Out: 550 [Urine:500; Blood:50] Intake/Output this shift: Total I/O In: 120 [P.O.:120] Out: 800 [Urine:800]  General appearance: alert, cooperative, and mild distress Resp: breathing comfortably GI: soft, tender at incisions.  Bruising at periumbilical and epigastric incisions.    Lab Results:  Recent Labs    12/15/21 0845 12/16/21 0158  WBC 6.8 14.5*  HGB 13.2 12.3  HCT 40.0 37.2  PLT 265 245   BMET Recent Labs    12/15/21 0845 12/16/21 0158  NA 140 134*  K 4.4 4.9  CL 103 101  CO2 26 26  GLUCOSE 100* 208*  BUN 21 17  CREATININE 1.39* 1.14*  CALCIUM 9.4 8.5*   PT/INR No results for input(s): "LABPROT", "INR" in the last 72 hours. ABG No results for input(s): "PHART", "HCO3" in the last 72 hours.  Invalid input(s): "PCO2", "PO2"  Studies/Results: No results found.  Anti-infectives: Anti-infectives (From admission, onward)    Start     Dose/Rate Route Frequency Ordered Stop   12/15/21 1700  ceFAZolin (ANCEF) IVPB 2g/100 mL premix        2 g 200 mL/hr over 30 Minutes Intravenous Every 8 hours 12/15/21 1528 12/15/21 1800   12/15/21 0815  ceFAZolin (ANCEF) IVPB 2g/100 mL premix        2 g 200  mL/hr over 30 Minutes Intravenous On call to O.R. 12/15/21 0814 12/15/21 1115       Assessment/Plan: s/p Procedure(s): LAPAROSCOPIC CHOLECYSTECTOMY (N/A) Improving. Will delay home until tomorrow given continued nausea and fatigue.  Pt is 87 and at high risk for readmit/fall/ARF if we try to d/c today.  Switching from oxy to hydrocodone.     LOS: 0 days    Stark Klein 12/16/2021

## 2021-12-16 NOTE — Progress Notes (Signed)
Mobility Specialist Progress Note:   12/16/21 1225  Mobility  Activity Ambulated with assistance in hallway  Level of Assistance Standby assist, set-up cues, supervision of patient - no hands on  Assistive Device Front wheel walker  Distance Ambulated (ft) 550 ft  Activity Response Tolerated well  $Mobility charge 1 Mobility   Pt in bed and agreeable. C/o of 5/10 abdominal pain. Pt left in chair with all needs met, call bell in reach, and chair alarm on.   Alexios Keown Mobility Specialist-Acute Rehab Secure Chat only

## 2021-12-17 DIAGNOSIS — K8012 Calculus of gallbladder with acute and chronic cholecystitis without obstruction: Secondary | ICD-10-CM | POA: Diagnosis not present

## 2021-12-17 MED ORDER — ONDANSETRON 4 MG PO TBDP: 4.0000 mg | ORAL_TABLET | Freq: Four times a day (QID) | ORAL | 0 refills | Status: DC | PRN

## 2021-12-17 NOTE — Plan of Care (Signed)
  Problem: Nutrition: Goal: Adequate nutrition will be maintained Outcome: Progressing   Problem: Safety: Goal: Ability to remain free from injury will improve Outcome: Progressing   

## 2021-12-17 NOTE — Progress Notes (Signed)
2 Days Post-Op   Subjective/Chief Complaint: Pt stayed overnight due to pain and nausea.  Sill having some nausea but able to tolerate PO.  Pain meds helping.     Objective: Vital signs in last 24 hours: Temp:  [97.5 F (36.4 C)-98.7 F (37.1 C)] 98.6 F (37 C) (09/02 0500) Pulse Rate:  [66-73] 66 (09/02 0500) Resp:  [16-18] 18 (09/02 0500) BP: (133-151)/(48-62) 142/62 (09/02 0500) SpO2:  [92 %-96 %] 94 % (09/02 0500)    Intake/Output from previous day: 09/01 0701 - 09/02 0700 In: 480 [P.O.:480] Out: 800 [Urine:800] Intake/Output this shift: No intake/output data recorded.  General appearance: alert, cooperative, and mild distress Resp: breathing comfortably GI: soft, tender at incisions.  Bruising at periumbilical and epigastric incisions.    Lab Results:  Recent Labs    12/15/21 0845 12/16/21 0158  WBC 6.8 14.5*  HGB 13.2 12.3  HCT 40.0 37.2  PLT 265 245    BMET Recent Labs    12/15/21 0845 12/16/21 0158  NA 140 134*  K 4.4 4.9  CL 103 101  CO2 26 26  GLUCOSE 100* 208*  BUN 21 17  CREATININE 1.39* 1.14*  CALCIUM 9.4 8.5*    PT/INR No results for input(s): "LABPROT", "INR" in the last 72 hours. ABG No results for input(s): "PHART", "HCO3" in the last 72 hours.  Invalid input(s): "PCO2", "PO2"  Studies/Results: No results found.  Anti-infectives: Anti-infectives (From admission, onward)    Start     Dose/Rate Route Frequency Ordered Stop   12/15/21 1700  ceFAZolin (ANCEF) IVPB 2g/100 mL premix        2 g 200 mL/hr over 30 Minutes Intravenous Every 8 hours 12/15/21 1528 12/15/21 1800   12/15/21 0815  ceFAZolin (ANCEF) IVPB 2g/100 mL premix        2 g 200 mL/hr over 30 Minutes Intravenous On call to O.R. 12/15/21 0814 12/15/21 1115       Assessment/Plan: s/p Procedure(s): LAPAROSCOPIC CHOLECYSTECTOMY (N/A) Improving. Will plan for d/c today if tolerates breakfast this am Cont PRN hydrocodone.     LOS: 0 days    Rosario Adie 06/19/1933

## 2021-12-20 NOTE — Anesthesia Postprocedure Evaluation (Signed)
Anesthesia Post Note  Patient: Brooke [name]  Procedure(s) Performed: LAPAROSCOPIC CHOLECYSTECTOMY (Abdomen)     Patient location during evaluation: PACU Anesthesia Type: General Level of consciousness: awake and alert Pain management: pain level controlled Vital Signs Assessment: post-procedure vital signs reviewed and stable Respiratory status: spontaneous breathing, nonlabored ventilation, respiratory function stable and patient connected to nasal cannula oxygen Cardiovascular status: blood pressure returned to baseline and stable Postop Assessment: no apparent nausea or vomiting Anesthetic complications: no   No notable events documented.  Last Vitals:  Vitals:   12/17/21 0804 12/17/21 1143  BP: (!) 174/78 (!) 171/53  Pulse: 78 75  Resp: 16 19  Temp: 37.1 C   SpO2: 93% 96%    Last Pain:  Vitals:   12/17/21 1143  TempSrc: Oral  PainSc:                  [place]

## 2022-01-31 DIAGNOSIS — Z23 Encounter for immunization: Secondary | ICD-10-CM | POA: Diagnosis not present

## 2022-01-31 DIAGNOSIS — F330 Major depressive disorder, recurrent, mild: Secondary | ICD-10-CM | POA: Diagnosis not present

## 2022-01-31 DIAGNOSIS — Z9049 Acquired absence of other specified parts of digestive tract: Secondary | ICD-10-CM | POA: Diagnosis not present

## 2022-03-23 DIAGNOSIS — N1832 Chronic kidney disease, stage 3b: Secondary | ICD-10-CM | POA: Diagnosis not present

## 2022-03-28 DIAGNOSIS — I129 Hypertensive chronic kidney disease with stage 1 through stage 4 chronic kidney disease, or unspecified chronic kidney disease: Secondary | ICD-10-CM | POA: Diagnosis not present

## 2022-03-28 DIAGNOSIS — N1832 Chronic kidney disease, stage 3b: Secondary | ICD-10-CM | POA: Diagnosis not present

## 2022-03-28 DIAGNOSIS — D631 Anemia in chronic kidney disease: Secondary | ICD-10-CM | POA: Diagnosis not present

## 2022-03-28 DIAGNOSIS — R609 Edema, unspecified: Secondary | ICD-10-CM | POA: Diagnosis not present

## 2022-03-28 DIAGNOSIS — N2581 Secondary hyperparathyroidism of renal origin: Secondary | ICD-10-CM | POA: Diagnosis not present

## 2022-05-09 DIAGNOSIS — D1801 Hemangioma of skin and subcutaneous tissue: Secondary | ICD-10-CM | POA: Diagnosis not present

## 2022-05-09 DIAGNOSIS — L80 Vitiligo: Secondary | ICD-10-CM | POA: Diagnosis not present

## 2022-05-09 DIAGNOSIS — L720 Epidermal cyst: Secondary | ICD-10-CM | POA: Diagnosis not present

## 2022-05-09 DIAGNOSIS — L821 Other seborrheic keratosis: Secondary | ICD-10-CM | POA: Diagnosis not present

## 2022-05-09 DIAGNOSIS — L814 Other melanin hyperpigmentation: Secondary | ICD-10-CM | POA: Diagnosis not present

## 2022-08-04 DIAGNOSIS — I358 Other nonrheumatic aortic valve disorders: Secondary | ICD-10-CM | POA: Diagnosis not present

## 2022-08-04 DIAGNOSIS — I517 Cardiomegaly: Secondary | ICD-10-CM | POA: Diagnosis not present

## 2022-08-04 DIAGNOSIS — R32 Unspecified urinary incontinence: Secondary | ICD-10-CM | POA: Diagnosis not present

## 2022-08-04 DIAGNOSIS — R197 Diarrhea, unspecified: Secondary | ICD-10-CM | POA: Diagnosis not present

## 2022-08-04 DIAGNOSIS — Z0000 Encounter for general adult medical examination without abnormal findings: Secondary | ICD-10-CM | POA: Diagnosis not present

## 2022-08-04 DIAGNOSIS — I10 Essential (primary) hypertension: Secondary | ICD-10-CM | POA: Diagnosis not present

## 2022-08-04 DIAGNOSIS — Z79899 Other long term (current) drug therapy: Secondary | ICD-10-CM | POA: Diagnosis not present

## 2022-08-04 DIAGNOSIS — E039 Hypothyroidism, unspecified: Secondary | ICD-10-CM | POA: Diagnosis not present

## 2022-08-04 DIAGNOSIS — I351 Nonrheumatic aortic (valve) insufficiency: Secondary | ICD-10-CM | POA: Diagnosis not present

## 2022-08-04 DIAGNOSIS — N183 Chronic kidney disease, stage 3 unspecified: Secondary | ICD-10-CM | POA: Diagnosis not present

## 2022-08-04 DIAGNOSIS — N2581 Secondary hyperparathyroidism of renal origin: Secondary | ICD-10-CM | POA: Diagnosis not present

## 2022-08-04 DIAGNOSIS — E782 Mixed hyperlipidemia: Secondary | ICD-10-CM | POA: Diagnosis not present

## 2022-08-07 DIAGNOSIS — R32 Unspecified urinary incontinence: Secondary | ICD-10-CM | POA: Diagnosis not present

## 2022-10-18 ENCOUNTER — Other Ambulatory Visit: Payer: Self-pay | Admitting: Cardiology

## 2022-11-11 ENCOUNTER — Other Ambulatory Visit: Payer: Self-pay | Admitting: Cardiology

## 2022-11-27 NOTE — Progress Notes (Unsigned)
Office Visit    Patient Name: Brooke [name] Date of Encounter: 11/27/2022  Primary Care Provider:  Mila Palmer, MD Primary Cardiologist:  Armanda Magic, MD Primary Electrophysiologist: None   Past Medical History    Past Medical History:  Diagnosis Date   Aortic insufficiency    mild by cMRI 11/2021   CAD (coronary artery disease), native coronary artery    coronary Ca score 153 with mild calcified plaque in the proximal LAD 25-49% by coronary CTA 08/2020   Chest pain    Chronic kidney disease    Diverticulitis    HTN (hypertension)    IBS (irritable bowel syndrome)    Osteopenia    Past Surgical History:  Procedure Laterality Date   CARDIAC CATHETERIZATION     CHOLECYSTECTOMY N/A 12/15/2021   Procedure: LAPAROSCOPIC CHOLECYSTECTOMY;  Surgeon: Almond Lint, MD;  Location: [facility] OR;  Service: General;  Laterality: N/A;   DILATION AND CURETTAGE OF UTERUS      Allergies  Allergies  Allergen Reactions   Macrobid [Nitrofurantoin Monohyd Macro] Hives and Rash   Sulfa Antibiotics Hives and Rash   Amlodipine Swelling   Ciprofloxacin Diarrhea     History of Present Illness    Brooke [name]  is a 88 year old female with a PMH of nonobstructive CAD, mild AI, HTN, HLD who presents today for 1 year follow-up.  Brooke [name] was seen initially by Dr. Delton See and is currently followed by Dr. Mayford Knife.  Underwent 2D echo showing of 65 to 70% with grade 1 DD and mild AI.  Coronary CTA was also completed on 08/2020 showing calcium score of 153 with normal aortic root nonobstructive CAD present.  She was last seen by Dr. Mayford Knife on 09/08/2021 for 1 year follow-up.  She continued to endorse DOE with exertion.  She had a cardiac MRI completed 11/16/2021 that showed normal EF with no LGE, mild aortic regurg with no evidence of significant LV hypertrophy.  Since last being seen in the office patient reports that she continues to feel chest discomfort with bending down and increased activity  such as gardening and walking.  She reports that her discomfort improves with rest and does not travel down her arms or up her neck.  Her blood pressures have been slightly elevated today was 146/62 heart rate was controlled at 66 bpm.  She recently had beta-blocker adjusted by her PCP with slight improvement of BP but still elevated.  She is currently followed by nephrology and is scheduled to follow-up with them in the coming weeks.  During today's visit we discussed the pathophysiology of hypertension and discussed how medications work in the body.  Patient denies chest pain, palpitations, dyspnea, PND, orthopnea, nausea, vomiting, dizziness, syncope, edema, weight gain, or early satiety.   Home Medications    Current Outpatient Medications  Medication Sig Dispense Refill   acetaminophen (TYLENOL) 325 MG tablet Take 325 mg by mouth every 6 (six) hours as needed for moderate pain.     alendronate (FOSAMAX) 70 MG tablet Take 70 mg by mouth every Tuesday.     aspirin 81 MG tablet Take 81 mg by mouth daily.     Calcium-Phosphorus-Vitamin D (CITRACAL +D3 PO) Take 1 tablet by mouth daily.     Cholecalciferol (VITAMIN D-3) 1000 UNITS CAPS Take 1,000 Units by mouth daily.      colchicine 0.6 MG tablet Take 0.6 mg by mouth daily as needed (gout).     cyanocobalamin (VITAMIN B12) 1000 MCG  tablet Take 1,000 mcg by mouth daily.     diphenhydrAMINE-APAP, sleep, (TYLENOL PM EXTRA STRENGTH PO) Take 1 tablet by mouth at bedtime.     famotidine (PEPCID) 40 MG tablet Take 40 mg by mouth daily.     HYDROcodone-acetaminophen (NORCO/VICODIN) 5-325 MG tablet Take 1 tablet by mouth every 4 (four) hours as needed for moderate pain. 12 tablet 0   levothyroxine (SYNTHROID) 25 MCG tablet Take 25 mcg by mouth daily before breakfast.     Loperamide-Simethicone (IMODIUM MULTI-SYMPTOM RELIEF) 2-125 MG TABS Take 0.5 tablets by mouth daily as needed (Upset stomach).     metoprolol succinate (TOPROL-XL) 25 MG 24 hr tablet Take  25 mg by mouth at bedtime.     Multiple Vitamins-Minerals (CENTRUM SILVER PO) Take 1 tablet by mouth daily.     ondansetron (ZOFRAN-ODT) 4 MG disintegrating tablet Take 1 tablet (4 mg total) by mouth every 6 (six) hours as needed for nausea. 20 tablet 0   PFIZER-BIONT COVID-19 VAC-TRIS SUSP injection      rosuvastatin (CRESTOR) 10 MG tablet Take 1 tablet (10 mg total) by mouth daily. Please call [phone] to schedule an overdue appointment with Dr. Armanda Magic for future refills. Thank you. 2nd attempt. 15 tablet 0   telmisartan (MICARDIS) 40 MG tablet Take 40 mg by mouth daily.     No current facility-administered medications for this visit.     Review of Systems  Please see the history of present illness.    (+) Chest discomfort (+) Shortness of breath  All other systems reviewed and are otherwise negative except as noted above.  Physical Exam    Wt Readings from Last 3 Encounters:  12/15/21 142 lb (64.4 kg)  09/08/21 148 lb (67.1 kg)  10/19/20 149 lb (67.6 kg)   VQ:QVZDG were no vitals filed for this visit.,There is no height or weight on file to calculate BMI.  Constitutional:      Appearance: Healthy appearance. Not in distress.  Neck:     Vascular: JVD normal.  Pulmonary:     Effort: Pulmonary effort is normal.     Breath sounds: No wheezing. No rales. Diminished in the bases Cardiovascular:     Normal rate. Regular rhythm. Normal S1. Normal S2.      Murmurs: There is no murmur.  Edema:    Peripheral edema absent.  Abdominal:     Palpations: Abdomen is soft non tender. There is no hepatomegaly.  Skin:    General: Skin is warm and dry.  Neurological:     General: No focal deficit present.     Mental Status: Alert and oriented to person, place and time.     Cranial Nerves: Cranial nerves are intact.  EKG/LABS/ Recent Cardiac Studies    ECG personally reviewed by me today -sinus rhythm with rate of 66 bpm and changes consistent with previous EKG.  Lab Results   Component Value Date   WBC 14.5 (H) 12/16/2021   HGB 12.3 12/16/2021   HCT 37.2 12/16/2021   MCV 87.7 12/16/2021   PLT 245 12/16/2021   Lab Results  Component Value Date   CREATININE 1.14 (H) 12/16/2021   BUN 17 12/16/2021   NA 134 (L) 12/16/2021   K 4.9 12/16/2021   CL 101 12/16/2021   CO2 26 12/16/2021   Lab Results  Component Value Date   ALT 21 12/16/2021   AST 41 12/16/2021   ALKPHOS 52 12/16/2021   BILITOT 0.6 12/16/2021   Lab Results  Component Value Date   CHOL 104 10/11/2020   HDL 46 10/11/2020   LDLCALC 37 10/11/2020   TRIG 118 10/11/2020   CHOLHDL 2.3 10/11/2020    No results found for: "HGBA1C"   Assessment & Plan    1.  Essential hypertension: -Patient's blood pressure today was 146/62 -We will discontinue Toprol-XL and start carvedilol 3.125 mg twice daily -She was advised to check blood pressures and log them over the next 2 weeks and contact our office with readings -Continue Micardis 40 mg daily -We will plan to refer patient to Pharm.D. if BP remains elevated with addition of carvedilol.  2.  DOE: -Cardiac MRA completed on 11/16/2021 showing no evidence of hypertrophic cardiomyopathy or infiltrative cardiac disease with no LGE, mild aortic regurgitation -Reports shortness of breath with activity such as bending down and gardening. -  3.  Nonobstructive CAD: -s/p coronary CTA 08/2020 showing calcium score of 153 with nonobstructive CAD present.  She continues to experience chest tightness with bending down and with certain activities. -Continue GDMT with ASA 81 mg and carvedilol 3.125 mg twice daily  4.  Nonrheumatic AI: -Mild AR present by MRA completed 11/2021 -Continue current GDMT with beta-blocker and Crestor 10 mg     Disposition: Follow-up with Armanda Magic, MD or APP in 3 months    Medication Adjustments/Labs and Tests Ordered: Current medicines are reviewed at length with the patient today.  Concerns regarding medicines are outlined  above.   Signed, Napoleon Form, Leodis Rains, NP 11/27/2022, 12:38 PM [facility]

## 2022-11-28 ENCOUNTER — Encounter: Payer: Self-pay | Admitting: Nurse Practitioner

## 2022-11-28 ENCOUNTER — Ambulatory Visit: Payer: PPO | Attending: Nurse Practitioner | Admitting: Nurse Practitioner

## 2022-11-28 VITALS — BP 146/62 | HR 66 | Ht 67.0 in | Wt 151.4 lb

## 2022-11-28 DIAGNOSIS — I10 Essential (primary) hypertension: Secondary | ICD-10-CM

## 2022-11-28 DIAGNOSIS — I2510 Atherosclerotic heart disease of native coronary artery without angina pectoris: Secondary | ICD-10-CM | POA: Diagnosis not present

## 2022-11-28 DIAGNOSIS — I351 Nonrheumatic aortic (valve) insufficiency: Secondary | ICD-10-CM | POA: Diagnosis not present

## 2022-11-28 DIAGNOSIS — R0609 Other forms of dyspnea: Secondary | ICD-10-CM

## 2022-11-28 MED ORDER — CARVEDILOL 3.125 MG PO TABS: 3.1250 mg | ORAL_TABLET | Freq: Two times a day (BID) | ORAL | 1 refills | Status: DC

## 2022-11-28 MED ORDER — ROSUVASTATIN CALCIUM 10 MG PO TABS: 10.0000 mg | ORAL_TABLET | Freq: Every day | ORAL | 2 refills | Status: DC

## 2022-11-28 NOTE — Patient Instructions (Addendum)
Medication Instructions:  STOP TOPROL XL START Coreg 3.125mg  take 1 tablet once a day  *If you need a refill on your cardiac medications before your next appointment, please call your pharmacy*   Lab Work: None ordered   Testing/Procedures: None ordered   Follow-Up: At [facility], you and your health needs are our priority.  As part of our continuing mission to provide you with exceptional heart care, we have created designated Provider Care Teams.  These Care Teams include your primary Cardiologist (physician) and Advanced Practice Providers (APPs -  Physician Assistants and Nurse Practitioners) who all work together to provide you with the care you need, when you need it.  We recommend signing up for the patient portal called "MyChart".  Sign up information is provided on this After Visit Summary.  MyChart is used to connect with patients for Virtual Visits (Telemedicine).  Patients are able to view lab/test results, encounter notes, upcoming appointments, etc.  Non-urgent messages can be sent to your provider as well.   To learn more about what you can do with MyChart, go to [email or web address].    Your next appointment:   3 month(s)  Provider:   Armanda Magic, MD     Other Instructions Check your blood pressure daily for 2 weeks, then contact the office with your readings.  Make sure to check 2 hours after your medications.   AVOID these things for 30 minutes before checking your blood pressure: No Drinking caffeine. No Drinking alcohol. No Eating. No Smoking. No Exercising.  Five minutes before checking your blood pressure: Pee. Sit in a dining chair. Avoid sitting in a soft couch or armchair. Be quiet. Do not talk.

## 2023-02-07 DIAGNOSIS — N183 Chronic kidney disease, stage 3 unspecified: Secondary | ICD-10-CM | POA: Diagnosis not present

## 2023-02-07 DIAGNOSIS — I10 Essential (primary) hypertension: Secondary | ICD-10-CM | POA: Diagnosis not present

## 2023-03-05 ENCOUNTER — Encounter: Payer: Self-pay | Admitting: Cardiology

## 2023-03-05 ENCOUNTER — Ambulatory Visit: Payer: PPO | Attending: Cardiology | Admitting: Cardiology

## 2023-03-05 VITALS — BP 150/36 | HR 72 | Ht 67.0 in | Wt 151.2 lb

## 2023-03-05 DIAGNOSIS — R079 Chest pain, unspecified: Secondary | ICD-10-CM | POA: Diagnosis not present

## 2023-03-05 DIAGNOSIS — E7800 Pure hypercholesterolemia, unspecified: Secondary | ICD-10-CM

## 2023-03-05 DIAGNOSIS — I351 Nonrheumatic aortic (valve) insufficiency: Secondary | ICD-10-CM | POA: Diagnosis not present

## 2023-03-05 DIAGNOSIS — I10 Essential (primary) hypertension: Secondary | ICD-10-CM

## 2023-03-05 DIAGNOSIS — R0609 Other forms of dyspnea: Secondary | ICD-10-CM | POA: Diagnosis not present

## 2023-03-05 DIAGNOSIS — I2510 Atherosclerotic heart disease of native coronary artery without angina pectoris: Secondary | ICD-10-CM

## 2023-03-05 LAB — BASIC METABOLIC PANEL
BUN/Creatinine Ratio: 18 (ref 12–28)
BUN: 21 mg/dL (ref 8–27)
CO2: 25 mmol/L (ref 20–29)
Calcium: 9.7 mg/dL (ref 8.7–10.3)
Chloride: 105 mmol/L (ref 96–106)
Creatinine, Ser: 1.2 mg/dL — ABNORMAL HIGH (ref 0.57–1.00)
Glucose: 93 mg/dL (ref 70–99)
Potassium: 4.6 mmol/L (ref 3.5–5.2)
Sodium: 141 mmol/L (ref 134–144)
eGFR: 44 mL/min/{1.73_m2} — ABNORMAL LOW (ref >59)

## 2023-03-05 MED ORDER — CARVEDILOL 12.5 MG PO TABS: 12.5000 mg | ORAL_TABLET | Freq: Two times a day (BID) | ORAL | 3 refills | Status: DC

## 2023-03-05 NOTE — Addendum Note (Signed)
Addended by: Luellen Pucker on: 03/05/2023 10:34 AM   Modules accepted: Orders

## 2023-03-05 NOTE — Addendum Note (Signed)
Addended by: Luellen Pucker on: 03/05/2023 01:41 PM   Modules accepted: Orders

## 2023-03-05 NOTE — Patient Instructions (Signed)
Medication Instructions:  Please increase your dose of carvedilol to 12.5 mg twice a day.  *If you need a refill on your cardiac medications before your next appointment, please call your pharmacy*   Lab Work: Please complete a BMET in our lab before you leave today.  If you have labs (blood work) drawn today and your tests are completely normal, you will receive your results only by: MyChart Message (if you have MyChart) OR A paper copy in the mail If you have any lab test that is abnormal or we need to change your treatment, we will call you to review the results.   Testing/Procedures: How to Prepare for Your Cardiac PET/CT Stress Test:  1. Please do not take these medications before your test:   Medications that may interfere with the cardiac pharmacological stress agent (ex. nitrates - including erectile dysfunction medications, isosorbide mononitrate- [please start to hold this medication the day before the test], tamulosin or beta-blockers) the day of the exam. (Erectile dysfunction medication should be held for at least 72 hrs prior to test) Theophylline containing medications for 12 hours. Dipyridamole 48 hours prior to the test. Your remaining medications may be taken with water.  2. Nothing to eat or drink, except water, 3 hours prior to arrival time.   NO caffeine/decaffeinated products, or chocolate 12 hours prior to arrival.  3. NO perfume, cologne or lotion on chest or abdomen area.          - FEMALES - Please avoid wearing dresses to this appointment.  4. Total time is 1 to 2 hours; you may want to bring reading material for the waiting time.  5. Please report to Radiology at the [facility] Main Entrance 30 minutes early for your test.  [address] [place], [place] [address]    IF YOU THINK YOU MAY BE PREGNANT, OR ARE NURSING PLEASE INFORM THE TECHNOLOGIST.  In preparation for your appointment, medication and supplies will be purchased.   Appointment availability is limited, so if you need to cancel or reschedule, please call the Radiology Department at [phone] [facility]) OR [phone] [facility])  24 hours in advance to avoid a cancellation fee of $100.00  What to Expect After you Arrive:  Once you arrive and check in for your appointment, you will be taken to a preparation room within the Radiology Department.  A technologist or Nurse will obtain your medical history, verify that you are correctly prepped for the exam, and explain the procedure.  Afterwards,  an IV will be started in your arm and electrodes will be placed on your skin for EKG monitoring during the stress portion of the exam. Then you will be escorted to the PET/CT scanner.  There, staff will get you positioned on the scanner and obtain a blood pressure and EKG.  During the exam, you will continue to be connected to the EKG and blood pressure machines.  A small, safe amount of a radioactive tracer will be injected in your IV to obtain a series of pictures of your heart along with an injection of a stress agent.    After your Exam:  It is recommended that you eat a meal and drink a caffeinated beverage to counter act any effects of the stress agent.  Drink plenty of fluids for the remainder of the day and urinate frequently for the first couple of hours after the exam.  Your doctor will inform you of your test results within 7-10 business days.  For more information and frequently asked questions, please visit our website : [email or web address]  For questions about your test or how to prepare for your test, please call: Cardiac Imaging Nurse Navigators Office: [phone]    Follow-Up:  Your next appointment:   1 year(s)  Provider:   Armanda Magic, MD     Other Instructions Dr. Mayford Knife has referred you to our hypertension clinic to be seen by our pharmacists.

## 2023-03-05 NOTE — Progress Notes (Signed)
Cardiology office note    Date:  03/05/2023   ID:  Brooke [name], DOB [birth year], MRN [id number]  PCP:  Mila Palmer, MD  Cardiologist:  Armanda Magic, MD   Chief Complaint  Patient presents with   Coronary Artery Disease   Hypertension   Hyperlipidemia   Shortness of Breath   Chest Pain   Aortic Insuffiency    History of Present Illness:  Brooke [name] is a 88 y.o. female with a hx of HTN and AI who was seen in 2016 for DOE and chest pain.  She has a hx of AI that was mild by echo in 2018 and normal myoview in 2016 with hypertensive BP response to exercise and poor exercise capacity was started on amlodipine. It was recommended that she get into a routine exercise program.   She underwent coronary CTA May 2022 showing coronary calcium score 153 with nonobstructive CAD with 25 to 49% calcified plaque in the proximal LAD and 25 to 49% soft plaque in the distal LAD after the D2 and before the D3 and otherwise normal coronary arteries.  2D echo done 09/03/2020 showed hyperdynamic LV function with EF 75% and grade 1 diastolic dysfunction with severe LVH and normal LV strain.  There was mild to moderate AI.    She was seen by Robin Searing, NP in August 2020 for for chronic noncardiac chest pain that occurs when bending down and with certain activities..  She also has chronic shortness of breath and had a cardiac MRI in 2023 showing no evidence of hokum or other infiltrative cardiac disease with no LGE.  There was mild AI.  She is here today for followup and is doing well.  She has chronic SOB when she bends over or when working in the yard but not with ADLs.  She still has chronic CP that is sporadic and has occurred for years and felt to be noncardiac but is very stable. She denies any PND, orthopnea, palpitations or syncope. She occasionally has some mild LE edema. She is compliant with her meds and is tolerating meds with no SE.     Past Medical History:  Diagnosis Date   Aortic  insufficiency    mild by cMRI 11/2021   CAD (coronary artery disease), native coronary artery    coronary Ca score 153 with mild calcified plaque in the proximal LAD 25-49% by coronary CTA 08/2020   Chest pain    Chronic kidney disease    Diverticulitis    HTN (hypertension)    IBS (irritable bowel syndrome)    Osteopenia     Past Surgical History:  Procedure Laterality Date   CARDIAC CATHETERIZATION     CHOLECYSTECTOMY N/A 12/15/2021   Procedure: LAPAROSCOPIC CHOLECYSTECTOMY;  Surgeon: Almond Lint, MD;  Location: [facility] OR;  Service: General;  Laterality: N/A;   DILATION AND CURETTAGE OF UTERUS      Current Medications: Current Meds  Medication Sig   acetaminophen (TYLENOL) 325 MG tablet Take 325 mg by mouth every 6 (six) hours as needed for moderate pain.   alendronate (FOSAMAX) 70 MG tablet Take 70 mg by mouth every Tuesday.   aspirin 81 MG tablet Take 81 mg by mouth daily.   Calcium-Phosphorus-Vitamin D (CITRACAL +D3 PO) Take 1 tablet by mouth daily.   carvedilol (COREG) 6.25 MG tablet Take 6.25 mg by mouth 2 (two) times daily.   Cholecalciferol (VITAMIN D-3) 1000 UNITS CAPS Take 1,000 Units by mouth every other day.   colchicine  0.6 MG tablet Take 0.6 mg by mouth daily as needed (gout).   cyanocobalamin (VITAMIN B12) 1000 MCG tablet Take 1,000 mcg by mouth daily.   diphenhydrAMINE-APAP, sleep, (TYLENOL PM EXTRA STRENGTH PO) Take 1 tablet by mouth at bedtime.   famotidine (PEPCID) 40 MG tablet Take 40 mg by mouth daily.   levothyroxine (SYNTHROID) 25 MCG tablet Take 25 mcg by mouth daily before breakfast.   Loperamide-Simethicone (IMODIUM MULTI-SYMPTOM RELIEF) 2-125 MG TABS Take 0.5 tablets by mouth daily as needed (Upset stomach).   Multiple Vitamins-Minerals (CENTRUM SILVER PO) Take 1 tablet by mouth daily.   PFIZER-BIONT COVID-19 VAC-TRIS SUSP injection    rosuvastatin (CRESTOR) 10 MG tablet Take 1 tablet (10 mg total) by mouth daily.   telmisartan (MICARDIS) 40 MG tablet Take  40 mg by mouth daily.    Allergies:   Macrobid [nitrofurantoin monohyd macro], Sulfa antibiotics, Amlodipine, and Ciprofloxacin   Social History   Socioeconomic History   Marital status: Married    Spouse name: Not on file   Number of children: 4   Years of education: Not on file   Highest education level: Not on file  Occupational History   Not on file  Tobacco Use   Smoking status: Never   Smokeless tobacco: Never  Vaping Use   Vaping status: Never Used  Substance and Sexual Activity   Alcohol use: Yes    Comment: occ   Drug use: No   Sexual activity: Not on file  Other Topics Concern   Not on file  Social History Narrative   Lives with husband.    Social Determinants of Health   Financial Resource Strain: Not on file  Food Insecurity: Not on file  Transportation Needs: Not on file  Physical Activity: Not on file  Stress: Not on file  Social Connections: Not on file     Family History:  The patient's family history includes Colon cancer in her mother; Heart attack in her father; Heart disease in her brother; Hypertension in her brother.   ROS:   Please see the history of present illness.    ROS All other systems reviewed and are negative.      No data to display             PHYSICAL EXAM:   VS:  BP (!) 150/36   Pulse 72   Ht 5\' 7"  (1.702 m)   Wt 151 lb 3.2 oz (68.6 kg)   SpO2 97%   BMI 23.68 kg/m    GEN: Well nourished, well developed in no acute distress HEENT: Normal NECK: No JVD; No carotid bruits LYMPHATICS: No lymphadenopathy CARDIAC:RRR, no murmurs, rubs, gallops RESPIRATORY:  Clear to auscultation without rales, wheezing or rhonchi  ABDOMEN: Soft, non-tender, non-distended MUSCULOSKELETAL:  No edema; No deformity  SKIN: Warm and dry NEUROLOGIC:  Alert and oriented x 3 PSYCHIATRIC:  Normal affect  Wt Readings from Last 3 Encounters:  03/05/23 151 lb 3.2 oz (68.6 kg)  11/28/22 151 lb 6.4 oz (68.7 kg)  12/15/21 142 lb (64.4 kg)       Studies/Labs Reviewed:    Recent Labs: No results found for requested labs within last 365 days.   Lipid Panel    Component Value Date/Time   CHOL 104 10/11/2020 0938   TRIG 118 10/11/2020 0938   HDL 46 10/11/2020 0938   CHOLHDL 2.3 10/11/2020 0938   LDLCALC 37 10/11/2020 0938    Additional studies/ records that were reviewed today include:  2D  echo 2018 and stress myoview 2016    ASSESSMENT:    1. Nonrheumatic aortic valve insufficiency   2. Essential hypertension   3. DOE (dyspnea on exertion)   4. Coronary artery disease involving native coronary artery of native heart without angina pectoris   5. Pure hypercholesterolemia       PLAN:  In order of problems listed above:  1.  Aortic Insufficiency -this was mild to moderate 08/2020 -Cardiac MRI 2023 with mild AI -Will check 2D echo  2.  HTN -BP is borderline controlled on exam today -Continue prescription drug management with  telmisartan 40 mg daily with as needed refills -increase Carvedilol to 12.5mg  BID -followup with PharmD in 2 weeks for BP check -check BMET  3.  DOE -This is she has continued to have DOE when walking and bending over -remote stress test with no ischemia, hypertensive BP response to exercise and poor exercise capacity>>suspect this is mainly deconditioning  -2D echo 2022 with hyperdynamic LV function and grade 1 diastolic dysfunction but also severe LVH  -Coronary CTA with non obstructive CAD 2022 -Cardiac MRI with no infiltrative cardiac disease and normal LV function  4.  ASCAD/Chest pain -she has chron noncardiac chest pain when out walking up inclines and bending over.  This seems to be a chronic problem for years along with SOB -coronary CTA 08/2021 showed a coronary calcium score of 153 with nonobstructive calcified plaque of 25 to 49% in the proximal LAD and noncalcified 25 to 49% plaque in the distal LAD >> this has not been felt to be the etiology of shortness of and chronic  chest pain -Continue prescription drug management with aspirin 81 mg daily and Crestor 10 mg daily with as needed refills -increasing Carvedilol to 12.5mg  BID for improved BP control -I am going to get a Stress PET CT to rule out ischemia and look for microvascular disease -Informed Consent   Shared Decision Making/Informed Consent The risks [chest pain, shortness of breath, cardiac arrhythmias, dizziness, blood pressure fluctuations, myocardial infarction, stroke/transient ischemic attack, nausea, vomiting, allergic reaction, radiation exposure, metallic taste sensation and life-threatening complications (estimated to be 1 in 10,000)], benefits (risk stratification, diagnosing coronary artery disease, treatment guidance) and alternatives of a cardiac PET stress test were discussed in detail with Brooke [name] and she agrees to proceed.     5.  Hyperlipidemia -LDL goal less than 70 -I have personally reviewed and interpreted outside labs performed by patient's PCP which showed LDL 38 and HDL 47 on 08/04/2022 -Continue prescription management with Crestor 10 mg daily with as needed refills   Medication Adjustments/Labs and Tests Ordered: Current medicines are reviewed at length with the patient today.  Concerns regarding medicines are outlined above.  Medication changes, Labs and Tests ordered today are listed in the Patient Instructions below.  There are no Patient Instructions on file for this visit.   Signed, Armanda Magic, MD  03/05/2023 10:14 AM    [facility] [address] [place], [place], [place]  [address] Phone: [phone]; Fax: [phone]

## 2023-03-07 ENCOUNTER — Telehealth: Payer: Self-pay

## 2023-03-07 NOTE — Telephone Encounter (Signed)
Call to patient to advise that per Dr. Mayford Knife, labs are stable labs, patient agrees to continue current meds. Results forwarded to PCP.

## 2023-03-12 NOTE — Addendum Note (Signed)
Addended by: Armanda Magic R on: 03/12/2023 03:30 PM   Modules accepted: Orders

## 2023-04-02 ENCOUNTER — Ambulatory Visit: Payer: PPO | Attending: Cardiology | Admitting: Pharmacist

## 2023-04-02 VITALS — BP 130/54 | HR 67

## 2023-04-02 DIAGNOSIS — I10 Essential (primary) hypertension: Secondary | ICD-10-CM | POA: Diagnosis not present

## 2023-04-02 NOTE — Assessment & Plan Note (Signed)
Assessment: Blood pressure in clinic high at first and then at goal on recheck Home blood pressures range from 111/59 to 150/65. Most of them <130/80 HR average 65 Dizziness when she bends down- ongoing issue even before increasing carvedilol Encouraged her to increase exercise  Plan: Continue carvedilol 12.5mg  twice a day, telmisartan 40mg  daily Follow up as needed

## 2023-04-02 NOTE — Patient Instructions (Addendum)
Try to increase your exercise Continue carvedilol 12.5mg  twice a day, telmisartan 40mg  daily Please call me at [phone] with any problems or if your blood pressure increases

## 2023-04-02 NOTE — Progress Notes (Signed)
Patient ID: Brooke [name]                 DOB: June 05, 1934                      MRN: [id number]      HPI: Brooke [name] is a 88 y.o. female referred by Dr. Mayford Knife to HTN clinic. PMH is significant for HTN, AI, CAD per CT.  She underwent coronary CTA May 2022 showing coronary calcium score 153 with nonobstructive CAD with 25 to 49% calcified plaque in the proximal LAD and 25 to 49% soft plaque in the distal LAD after the D2 and before the D3 and otherwise normal coronary arteries. 2D echo done 09/03/2020 showed hyperdynamic LV function with EF 75% and grade 1 diastolic dysfunction with severe LVH and normal LV strain. There was mild to moderate AI.   Seen by Dr. Mayford Knife 03/05/23. BP was 150/36. Carvedilol was increased to 12.5mg  BID.   Patient presents today for follow up. She reports dizziness when she bends over, aslo reports pounding heart. This is an on going issue for several years in which several work-ups have been done. She takes her carvedilol 10-12 hours apart. Use to go for walks but her husband has dementia and wont walk with her anymore. Does her own cooking and limits sodium.  She brings in a list of blood pressures. They average out to 130/56.   Current HTN meds: carvedilol 12.5mg  twice a day, telmisartan 40mg  daily Previously tried: amlodipine 2.5, 5 (swelling), enalapril, metoprolol, hydrochlorothiazide, chlorthalidone (hyponatremia??) BP goal: <130/80  Family History:  The patient's family history includes Colon cancer in her mother; Heart attack in her father; Heart disease in her brother; Hypertension in her brother.   Social History: 1 glass of wine a week, no tobacco  Diet: most of her own cooking, eats out once-twice a month. Doesn't salt food. No prepared meals, canned foods  Exercise: use to do more. Husband has dementia   Home BP readings:  138 57 61  152 65 66  121 52 66  124 55 65  127 57 71  114 52 66  124 57 61  111 59 68  150  57 67  141 51 62   133 57 67  130.4545 56.27273 65.45455    Wt Readings from Last 3 Encounters:  03/05/23 151 lb 3.2 oz (68.6 kg)  11/28/22 151 lb 6.4 oz (68.7 kg)  12/15/21 142 lb (64.4 kg)   BP Readings from Last 3 Encounters:  04/02/23 (!) 130/54  03/05/23 (!) 150/36  11/28/22 (!) 146/62   Pulse Readings from Last 3 Encounters:  04/02/23 67  03/05/23 72  11/28/22 66    Renal function: CrCl cannot be calculated (Patient's most recent lab result is older than the maximum 21 days allowed.).  Past Medical History:  Diagnosis Date   Aortic insufficiency    mild by cMRI 11/2021   CAD (coronary artery disease), native coronary artery    coronary Ca score 153 with mild calcified plaque in the proximal LAD 25-49% by coronary CTA 08/2020   Chest pain    Chronic kidney disease    Diverticulitis    HTN (hypertension)    IBS (irritable bowel syndrome)    Osteopenia     Current Outpatient Medications on File Prior to Visit  Medication Sig Dispense Refill   carvedilol (COREG) 12.5 MG tablet Take 1 tablet (12.5 mg total) by mouth 2 (two) times daily.  180 tablet 3   telmisartan (MICARDIS) 40 MG tablet Take 40 mg by mouth daily.     acetaminophen (TYLENOL) 325 MG tablet Take 325 mg by mouth every 6 (six) hours as needed for moderate pain.     alendronate (FOSAMAX) 70 MG tablet Take 70 mg by mouth every Tuesday.     aspirin 81 MG tablet Take 81 mg by mouth daily.     Calcium-Phosphorus-Vitamin D (CITRACAL +D3 PO) Take 1 tablet by mouth daily.     Cholecalciferol (VITAMIN D-3) 1000 UNITS CAPS Take 1,000 Units by mouth every other day.     colchicine 0.6 MG tablet Take 0.6 mg by mouth daily as needed (gout).     cyanocobalamin (VITAMIN B12) 1000 MCG tablet Take 1,000 mcg by mouth daily.     diphenhydrAMINE-APAP, sleep, (TYLENOL PM EXTRA STRENGTH PO) Take 1 tablet by mouth at bedtime.     famotidine (PEPCID) 40 MG tablet Take 40 mg by mouth daily.     levothyroxine (SYNTHROID) 25 MCG tablet Take 25 mcg  by mouth daily before breakfast.     Loperamide-Simethicone (IMODIUM MULTI-SYMPTOM RELIEF) 2-125 MG TABS Take 0.5 tablets by mouth daily as needed (Upset stomach).     Multiple Vitamins-Minerals (CENTRUM SILVER PO) Take 1 tablet by mouth daily.     PFIZER-BIONT COVID-19 VAC-TRIS SUSP injection      rosuvastatin (CRESTOR) 10 MG tablet Take 1 tablet (10 mg total) by mouth daily. 90 tablet 2   No current facility-administered medications on file prior to visit.    Allergies  Allergen Reactions   Macrobid [Nitrofurantoin Monohyd Macro] Hives and Rash   Sulfa Antibiotics Hives and Rash   Amlodipine Swelling   Ciprofloxacin Diarrhea    Blood pressure (!) 130/54, pulse 67.   Assessment/Plan:     1. Hypertension -  Essential hypertension Assessment: Blood pressure in clinic high at first and then at goal on recheck Home blood pressures range from 111/59 to 150/65. Most of them <130/80 HR average 65 Dizziness when she bends down- ongoing issue even before increasing carvedilol Encouraged her to increase exercise  Plan: Continue carvedilol 12.5mg  twice a day, telmisartan 40mg  daily Follow up as needed   Thank you  Olene Floss, Pharm.D, BCACP, BCPS, CPP [facility] of [facility] [address] [place], [place] [address]  Phone: [phone]; Fax: [phone]

## 2023-04-08 DIAGNOSIS — M10071 Idiopathic gout, right ankle and foot: Secondary | ICD-10-CM | POA: Diagnosis not present

## 2023-04-20 ENCOUNTER — Encounter (HOSPITAL_COMMUNITY): Payer: Self-pay

## 2023-04-23 ENCOUNTER — Telehealth (HOSPITAL_COMMUNITY): Payer: Self-pay | Admitting: *Deleted

## 2023-04-23 NOTE — Telephone Encounter (Signed)
 Reaching out to patient to offer assistance regarding upcoming cardiac imaging study; pt verbalizes understanding of appt date/time, parking situation and where to check in, pre-test NPO status and medications ordered, and verified current allergies; name and call back number provided for further questions should they arise Johney Frame RN Navigator Cardiac Imaging [facility] [phone] office [phone] cell  Patient aware to avoid caffeine 12 hours prior to test.

## 2023-04-23 NOTE — Telephone Encounter (Signed)
 Attempted to call patient regarding upcoming cardiac PET appointment. Left message on voicemail with name and callback number  Larey Brick RN Navigator Cardiac Imaging [facility] [phone] Office [phone] Cell

## 2023-04-24 ENCOUNTER — Telehealth: Payer: Self-pay | Admitting: Cardiology

## 2023-04-24 ENCOUNTER — Emergency Department (HOSPITAL_COMMUNITY)
Admission: EM | Admit: 2023-04-24 | Discharge: 2023-04-25 | Disposition: A | Discharge status: Home / Self Care | Payer: PPO | Source: Home / Self Care | Attending: Emergency Medicine | Admitting: Emergency Medicine

## 2023-04-24 ENCOUNTER — Encounter (HOSPITAL_BASED_OUTPATIENT_CLINIC_OR_DEPARTMENT_OTHER)
Admission: RE | Admit: 2023-04-24 | Discharge: 2023-04-24 | Disposition: A | Discharge status: Home / Self Care | Payer: PPO | Source: Ambulatory Visit | Attending: Cardiology | Admitting: Cardiology

## 2023-04-24 ENCOUNTER — Emergency Department (HOSPITAL_COMMUNITY)
Admission: EM | Admit: 2023-04-24 | Discharge: 2023-04-24 | Discharge status: Against Medical Advice | Payer: PPO | Attending: Emergency Medicine | Admitting: Emergency Medicine

## 2023-04-24 ENCOUNTER — Other Ambulatory Visit: Payer: Self-pay

## 2023-04-24 ENCOUNTER — Encounter (HOSPITAL_COMMUNITY): Payer: Self-pay

## 2023-04-24 ENCOUNTER — Emergency Department (HOSPITAL_COMMUNITY): Payer: PPO

## 2023-04-24 DIAGNOSIS — I2510 Atherosclerotic heart disease of native coronary artery without angina pectoris: Secondary | ICD-10-CM | POA: Insufficient documentation

## 2023-04-24 DIAGNOSIS — Z79899 Other long term (current) drug therapy: Secondary | ICD-10-CM | POA: Insufficient documentation

## 2023-04-24 DIAGNOSIS — I10 Essential (primary) hypertension: Secondary | ICD-10-CM | POA: Insufficient documentation

## 2023-04-24 DIAGNOSIS — R079 Chest pain, unspecified: Secondary | ICD-10-CM

## 2023-04-24 DIAGNOSIS — N189 Chronic kidney disease, unspecified: Secondary | ICD-10-CM | POA: Insufficient documentation

## 2023-04-24 DIAGNOSIS — Z5321 Procedure and treatment not carried out due to patient leaving prior to being seen by health care provider: Secondary | ICD-10-CM | POA: Diagnosis not present

## 2023-04-24 DIAGNOSIS — R0609 Other forms of dyspnea: Secondary | ICD-10-CM | POA: Insufficient documentation

## 2023-04-24 DIAGNOSIS — R42 Dizziness and giddiness: Secondary | ICD-10-CM | POA: Diagnosis present

## 2023-04-24 DIAGNOSIS — I129 Hypertensive chronic kidney disease with stage 1 through stage 4 chronic kidney disease, or unspecified chronic kidney disease: Secondary | ICD-10-CM | POA: Diagnosis not present

## 2023-04-24 DIAGNOSIS — Z7982 Long term (current) use of aspirin: Secondary | ICD-10-CM | POA: Diagnosis not present

## 2023-04-24 LAB — CBC WITH DIFFERENTIAL/PLATELET
Abs Immature Granulocytes: 0.03 10*3/uL (ref 0.00–0.07)
Basophils Absolute: 0 10*3/uL (ref 0.0–0.1)
Basophils Relative: 1 %
Eosinophils Absolute: 0.2 10*3/uL (ref 0.0–0.5)
Eosinophils Relative: 3 %
HCT: 38 % (ref 36.0–46.0)
Hemoglobin: 12.4 g/dL (ref 12.0–15.0)
Immature Granulocytes: 0 %
Lymphocytes Relative: 14 %
Lymphs Abs: 1.1 10*3/uL (ref 0.7–4.0)
MCH: 30.6 pg (ref 26.0–34.0)
MCHC: 32.6 g/dL (ref 30.0–36.0)
MCV: 93.8 fL (ref 80.0–100.0)
Monocytes Absolute: 0.6 10*3/uL (ref 0.1–1.0)
Monocytes Relative: 8 %
Neutro Abs: 5.6 10*3/uL (ref 1.7–7.7)
Neutrophils Relative %: 74 %
Platelets: 194 10*3/uL (ref 150–400)
RBC: 4.05 MIL/uL (ref 3.87–5.11)
RDW: 13.2 % (ref 11.5–15.5)
WBC: 7.5 10*3/uL (ref 4.0–10.5)
nRBC: 0 % (ref 0.0–0.2)

## 2023-04-24 LAB — COMPREHENSIVE METABOLIC PANEL
ALT: 20 U/L (ref 0–44)
AST: 23 U/L (ref 15–41)
Albumin: 4 g/dL (ref 3.5–5.0)
Alkaline Phosphatase: 48 U/L (ref 38–126)
Anion gap: 10 (ref 5–15)
BUN: 31 mg/dL — ABNORMAL HIGH (ref 8–23)
CO2: 22 mmol/L (ref 22–32)
Calcium: 9.2 mg/dL (ref 8.9–10.3)
Chloride: 103 mmol/L (ref 98–111)
Creatinine, Ser: 1.23 mg/dL — ABNORMAL HIGH (ref 0.44–1.00)
GFR, Estimated: 43 mL/min — ABNORMAL LOW (ref >60)
Glucose, Bld: 168 mg/dL — ABNORMAL HIGH (ref 70–99)
Potassium: 4.3 mmol/L (ref 3.5–5.1)
Sodium: 135 mmol/L (ref 135–145)
Total Bilirubin: 0.7 mg/dL (ref 0.0–1.2)
Total Protein: 7 g/dL (ref 6.5–8.1)

## 2023-04-24 LAB — NM PET CT CARDIAC PERFUSION MULTI W/ABSOLUTE BLOODFLOW
LV dias vol: 80 mL (ref 46–106)
LV sys vol: 64 mL
MBFR: 2.11
Nuc Rest EF: 72 %
Nuc Stress EF: 69 %
Peak HR: 84 {beats}/min
Rest HR: 60 {beats}/min
Rest MBF: 0.88 ml/g/min
Rest Nuclear Isotope Dose: 18 mCi
Rest perfusion cavity size (mL): 64 mL
ST Depression (mm): 0 mm
Stress MBF: 1.86 ml/g/min
Stress Nuclear Isotope Dose: 18 mCi
Stress perfusion cavity size (mL): 80 mL
TID: 1.11

## 2023-04-24 LAB — TROPONIN I (HIGH SENSITIVITY): Troponin I (High Sensitivity): 8 ng/L (ref <18)

## 2023-04-24 MED ORDER — RUBIDIUM RB82 GENERATOR (RUBYFILL)
18.0000 | PACK | Freq: Once | INTRAVENOUS | Status: AC
  Administered 2023-04-24: 18 via INTRAVENOUS

## 2023-04-24 MED ORDER — RUBIDIUM RB82 GENERATOR (RUBYFILL)
18.0800 | PACK | Freq: Once | INTRAVENOUS | Status: AC
  Administered 2023-04-24: 18.08 via INTRAVENOUS

## 2023-04-24 MED ORDER — REGADENOSON 0.4 MG/5ML IV SOLN
0.4000 mg | Freq: Once | INTRAVENOUS | Status: AC
  Administered 2023-04-24: 0.4 mg via INTRAVENOUS

## 2023-04-24 MED ORDER — REGADENOSON 0.4 MG/5ML IV SOLN
INTRAVENOUS | Status: AC
  Filled 2023-04-24: qty 5

## 2023-04-24 NOTE — ED Provider Triage Note (Signed)
 Emergency Medicine Provider Triage Evaluation Note  Brooke [name] , a 89 y.o. female  was evaluated in triage.  Pt complains of hypertension.  Patient was seen earlier and ultimately left to go to get her husband but states that she came in after receiving contrast from nuclear medicine and becoming hypertensive with allergic reactions.  Patient states the rash and shortness of breath has improved however she is still hypertensive which is new for her.  Patient denies chest pain or shortness of breath.  Patient states she normally runs at 130 systolic.SABRA  Review of Systems  Positive:  Negative:   Physical Exam  BP (!) 176/75 (BP Location: Right Arm)   Pulse 71   Temp 97.6 F (36.4 C) (Oral)   Resp 16   SpO2 100%  Gen:   Awake, no distress   Resp:  Normal effort  MSK:   Moves extremities without difficulty  Other:    Medical Decision Making  Medically screening exam initiated at 6:00 PM.  Appropriate orders placed.  Beverley MARLA Macadam was informed that the remainder of the evaluation will be completed by another provider, this initial triage assessment does not replace that evaluation, and the importance of remaining in the ED until their evaluation is complete.  Workup initiated, labs already drawn from earlier, patient stable at this time.   Victor Lynwood DASEN, PA-C 04/24/23 1801

## 2023-04-24 NOTE — ED Triage Notes (Signed)
 Pt presents with c/o medication reaction and hypertension. Pt was at nuclear medicine today and was given a dye for a test and started to feel off after receiving the dye. Pt reports she knew that something was wrong and did not feel right. Pt's BP continues to rise and she is very hypertensive. Nuclear medicine reports that her BP remained high even after the dye should have been out of her system.

## 2023-04-24 NOTE — ED Provider Triage Note (Cosign Needed)
 Emergency Medicine Provider Triage Evaluation Note  Brooke [name] , a 89 y.o. female  was evaluated in triage.  Pt complains of HTN.  Review of Systems  Positive:  Negative:   Physical Exam  BP (!) 203/86 (BP Location: Right Arm)   Pulse 73   Temp 98 F (36.7 C) (Oral)   Resp 16   SpO2 100%  Gen:   Awake, no distress   Resp:  Normal effort  MSK:   Moves extremities without difficulty  Other:    Medical Decision Making  Medically screening exam initiated at 12:18 PM.  Appropriate orders placed.  Beverley MARLA Macadam was informed that the remainder of the evaluation will be completed by another provider, this initial triage assessment does not replace that evaluation, and the importance of remaining in the ED until their evaluation is complete.  Patient was given medication by nuclear medicine and her BP has been increasing since then. Patient stating that she was getting very shaky and maybe had a little bit of mild SOB but denied any other symptoms. Patient a little shaky right now  and stating that her BP is making her anxious but is denying any other symptoms.   Hoy Nidia FALCON, [place] 04/24/23 1222

## 2023-04-24 NOTE — Telephone Encounter (Signed)
 After patient had her Cardiac PET CT her BP was 235/85  and her body was shaking all over. Patient stated CT department sent her to the ED. Patient went to the ED. Patient stated that they did an EKG and then she had been waiting of over an hour. Patient left ED to attend to her husband who has dementia. Patient stated she started to feel better, and then her BP started going back up and the shaking came back. Patient sounds SOB on the phone. Patient stated she was going to go back to the ED like we advised, but is going to have a friend drive her and someone is going to stay with her husband. Will forward to Dr. Shlomo for further advisement.

## 2023-04-24 NOTE — Progress Notes (Addendum)
 Pt came in for PET scan of her heart. Pt completed and scan and was able to complete scan. After the scan, she did not voice any complaints and drank her shasta cola. Tech took pt out and pt started to feel shaky. Vitals retaken and Dr. Barbaraann notified. Please see vital flowsheet with more information  Pt taken to the ED. Dr Barbaraann notified

## 2023-04-24 NOTE — ED Triage Notes (Signed)
 Pt eloped earlier this afternoon after being brought over by nuclear medicine for a potential medication reaction and associated hypertension. Pt came back because her blood pressure is still elevated. No symptoms at this time.

## 2023-04-24 NOTE — Telephone Encounter (Signed)
 Pt c/o BP issue: STAT if pt c/o blurred vision, one-sided weakness or slurred speech  1. What are your last 5 BP readings? 198/61, 173/65  2. Are you having any other symptoms (ex. Dizziness, headache, blurred vision, passed out)? Jitters   3. What is your BP issue? Pt states bp is too high

## 2023-04-24 NOTE — Telephone Encounter (Signed)
 Per Nurse pt should go back to ER. I have verbally spoke to pt and relayed this to her and she is going back to the ER

## 2023-04-25 NOTE — Discharge Instructions (Signed)
 You were seen today with concerns for high blood pressure.  Your blood pressure down trended in the emergency department without any intervention.  Your laboratory testing is reassuring.

## 2023-04-25 NOTE — Telephone Encounter (Signed)
 Spoke with pt and explained that Dr. Shlomo stated she has reviewed the last ER visit notes and it appears the pt's BP was fine. Explained that Dr. Dorine recommendations are to take BP readings twice daily for 1 week and keep a log of the readings. At the end of the week call our office and let us  know what the readings are. Pt verbalized understanding and had no further questions.

## 2023-04-25 NOTE — ED Provider Notes (Signed)
 [place] EMERGENCY DEPARTMENT AT [facility] Provider Note   CSN: [id number] Arrival date & time: 04/24/23  1704     History  Chief Complaint  Patient presents with   Hypertension    Brooke [name] is a 89 y.o. female.  HPI     This is an 89 year old female with history of hypertension who presents with concern for high blood pressure.  She had a nuclear stress test yesterday morning.  She states that after the stress test she started to feel funny and lightheaded.  She states that she could not get in her car because she felt so shaky.  They took her back into the clinic and took her blood pressure and it was elevated.  This did not downtrend appropriately so she was brought to the emergency department.  She does report that she took her morning blood pressure medications.  After 1 to 2 hours in the waiting room she left because her husband has dementia and she needs to check on him.  She states at home she was anxious about her blood pressure so took her blood pressure multiple times on an automatic cuff and continue to get high readings in the 180s to 200s systolic.  She states that normally is in the 130s.  She denies chest pain, headache, focal deficits, strokelike symptoms.  She states she generally feels normal at this time although she is anxious and wants to make sure that she does not have a stroke.  Home Medications Prior to Admission medications   Medication Sig Start Date End Date Taking? Authorizing Provider  acetaminophen  (TYLENOL ) 325 MG tablet Take 325 mg by mouth every 6 (six) hours as needed for moderate pain.    [provider]  alendronate (FOSAMAX) 70 MG tablet Take 70 mg by mouth every Tuesday. 11/04/21   [provider]  aspirin 81 MG tablet Take 81 mg by mouth daily.    [provider]  Calcium -Phosphorus-Vitamin D (CITRACAL +D3 PO) Take 1 tablet by mouth daily.    [provider]  carvedilol  (COREG ) 12.5 MG tablet  Take 1 tablet (12.5 mg total) by mouth 2 (two) times daily. 03/05/23 06/03/23  Shlomo Wilbert SAUNDERS, MD  Cholecalciferol (VITAMIN D-3) 1000 UNITS CAPS Take 1,000 Units by mouth every other day.    [provider]  colchicine  0.6 MG tablet Take 0.6 mg by mouth daily as needed (gout). 09/08/21   [provider]  cyanocobalamin  (VITAMIN B12) 1000 MCG tablet Take 1,000 mcg by mouth daily.    [provider]  diphenhydrAMINE -APAP, sleep, (TYLENOL  PM EXTRA STRENGTH PO) Take 1 tablet by mouth at bedtime.    [provider]  famotidine  (PEPCID ) 40 MG tablet Take 40 mg by mouth daily.    [provider]  levothyroxine  (SYNTHROID ) 25 MCG tablet Take 25 mcg by mouth daily before breakfast.    [provider]  Loperamide -Simethicone  (IMODIUM  MULTI-SYMPTOM RELIEF) 2-125 MG TABS Take 0.5 tablets by mouth daily as needed (Upset stomach).    [provider]  Multiple Vitamins-Minerals (CENTRUM SILVER PO) Take 1 tablet by mouth daily.    [provider]  PFIZER-BIONT COVID-19 VAC-TRIS SUSP injection  08/07/20   [provider]  rosuvastatin  (CRESTOR ) 10 MG tablet Take 1 tablet (10 mg total) by mouth daily. 11/28/22   Wyn Jackee VEAR Mickey., NP  telmisartan  (MICARDIS ) 40 MG tablet Take 40 mg by mouth daily. 09/30/20   [provider]  Allergies    Macrobid [nitrofurantoin monohyd macro], Sulfa antibiotics, Amlodipine , and Ciprofloxacin    Review of Systems   Review of Systems  Constitutional:  Negative for fever.  Respiratory:  Negative for shortness of breath.   Cardiovascular:  Negative for chest pain.  All other systems reviewed and are negative.   Physical Exam Updated Vital Signs BP (!) 185/71   Pulse 79   Temp 97.9 F (36.6 C) (Oral)   Resp 18   SpO2 100%  Physical Exam Vitals and nursing note reviewed.  Constitutional:      Appearance: She is well-developed. She is not ill-appearing.  HENT:     Head:  Normocephalic and atraumatic.  Eyes:     Pupils: Pupils are equal, round, and reactive to light.  Cardiovascular:     Rate and Rhythm: Normal rate and regular rhythm.     Heart sounds: Normal heart sounds.  Pulmonary:     Effort: Pulmonary effort is normal. No respiratory distress.     Breath sounds: No wheezing.  Abdominal:     General: Bowel sounds are normal.     Palpations: Abdomen is soft.  Musculoskeletal:     Cervical back: Neck supple.  Skin:    General: Skin is warm and dry.  Neurological:     Mental Status: She is alert and oriented to person, place, and time.     Comments: Cranial nerves II through intact, 5 out of 5 strength in all 4 extremities, no dysmetria to finger-nose-finger  Psychiatric:        Mood and Affect: Mood normal.     ED Results / Procedures / Treatments   Labs (all labs ordered are listed, but only abnormal results are displayed) Labs Reviewed  TROPONIN I (HIGH SENSITIVITY)    EKG None  Radiology NM PET CT CARDIAC PERFUSION MULTI W/ABSOLUTE BLOODFLOW Result Date: 04/24/2023   LV perfusion is normal. There is no evidence of ischemia. There is no evidence of infarction.   Rest left ventricular function is normal. Rest EF: 72%. Stress left ventricular function is normal. Stress EF: 69%. End diastolic cavity size is normal.   Myocardial blood flow was computed to be 0.67ml/g/min at rest and 1.82ml/g/min at stress. Global myocardial blood flow reserve was 2.11 and was normal.   Coronary calcium  was present on the attenuation correction CT images. Moderate coronary calcifications were present. Coronary calcifications were present in the left anterior descending artery distribution(s).   The study is normal. The study is low risk. Electronically signed by Darryle Decent, MD ___________________________________________________________________________ ________________________________________ CLINICAL DATA:  This over-read does not include interpretation of  cardiac or coronary anatomy or pathology. The Cardiac PET CT interpretation by the cardiologist is attached. COMPARISON:  CT chest, 10/28/2020 FINDINGS: Cardiovascular: Aortic atherosclerosis. Cardiomegaly. Left coronary artery calcifications. No pericardial effusion. Limited Mediastinum/Nodes: No enlarged mediastinal, hilar, or axillary lymph nodes. Trachea and esophagus demonstrate no significant findings. Limited Lungs/Pleura: Bandlike scarring of the left lung base. No pleural effusion or pneumothorax. Upper Abdomen: No acute abnormality. Musculoskeletal: No chest wall abnormality. No acute osseous findings. IMPRESSION: 1. No acute CT findings of the chest. 2. Cardiomegaly and coronary artery disease. Aortic Atherosclerosis (ICD10-I70.0). Electronically Signed   By: Marolyn JONETTA Jaksch M.D.   On: 04/24/2023 12:06  DG Chest Port 1 View Result Date: 04/24/2023 CLINICAL DATA:  Hypertension EXAM: PORTABLE CHEST 1 VIEW COMPARISON:  09/17/2014, CT 10/28/2020 FINDINGS: The heart size and mediastinal contours are within normal limits. Aortic atherosclerosis. Both lungs are clear.  The visualized skeletal structures are unremarkable. IMPRESSION: No active disease. Electronically Signed   By: Luke Bun M.D.   On: 04/24/2023 18:38    Procedures Procedures    Medications Ordered in ED Medications - No data to display  ED Course/ Medical Decision Making/ A&P Clinical Course as of 04/25/23 0210  Wed Apr 25, 2023  0208 On recheck, patient remains asymptomatic.  Blood pressure 135/58. [CH]    Clinical Course User Index [CH] Jhalen Eley, Charmaine FALCON, MD                                 Medical Decision Making  This patient presents to the ED for concern of high blood pressure, this involves an extensive number of treatment options, and is a complaint that carries with it a high risk of complications and morbidity.  I considered the following differential and admission for this acute, potentially life threatening  condition.  The differential diagnosis includes as well hypertension, hypertensive urgency, hypertensive emergency, adverse reaction  MDM:    This is an 89 year old female who presents with concerns for high blood pressure.  Patient had an adverse reaction to her nuclear medicine study earlier yesterday morning.  Since that time she reports persistently elevated blood pressures at home.  She is relatively asymptomatic.  Does report that she felt funny after the testing but has not had any chest pain or strokelike symptoms.  She is nontoxic-appearing and vital signs are notable for blood pressures ranging from 170s to 210s over 60s to 70s.  She is very anxious appearing and self-reports concern over her high blood pressure readings.  Lab work reviewed from yesterday and is largely reassuring including troponin x 2.  Chest x-ray and EKG are also reassuring.  Patient is essentially asymptomatic.  Discussed with her options including watching closely to see if this trauma trends independently.  We discussed the data that does not recommend for aggressive blood pressure management in nonemergent scenarios.  After approximately 1 hour of observation, patient downtrended to 135/58.  She is comfortable going home.  Recommend that she continue her blood pressure medication at home and follow-up with cardiology.  She is comfortable with this plan.  (Labs, imaging, consults)  Labs: I Ordered, and personally interpreted labs.  The pertinent results include: Prior basic lab work including CBC, BMP, troponin x 2  Imaging Studies ordered: I ordered imaging studies including chest x-ray I independently visualized and interpreted imaging. I agree with the radiologist interpretation  Additional history obtained from son at bedside.  External records from outside source obtained and reviewed including stress test  Cardiac Monitoring: The patient was maintained on a cardiac monitor.  If on the cardiac monitor, I  personally viewed and interpreted the cardiac monitored which showed an underlying rhythm of: Sinus  Reevaluation: After the interventions noted above, I reevaluated the patient and found that they have :improved  Social Determinants of Health:  lives independently  Disposition: Discharge  Co morbidities that complicate the patient evaluation  Past Medical History:  Diagnosis Date   Aortic insufficiency    mild by cMRI 11/2021   CAD (coronary artery disease), native coronary artery    coronary Ca score 153 with mild calcified plaque in the proximal LAD 25-49% by coronary CTA 08/2020   Chest pain    Chronic kidney disease    Diverticulitis    HTN (hypertension)    IBS (irritable bowel syndrome)  Osteopenia      Medicines No orders of the defined types were placed in this encounter.   I have reviewed the patients home medicines and have made adjustments as needed  Problem List / ED Course: Problem List Items Addressed This Visit   None Visit Diagnoses       Hypertension, unspecified type    -  Primary                   Final Clinical Impression(s) / ED Diagnoses Final diagnoses:  Hypertension, unspecified type    Rx / DC Orders ED Discharge Orders     None         Bari Charmaine FALCON, MD 04/25/23 [phone]

## 2023-04-27 ENCOUNTER — Telehealth: Payer: Self-pay

## 2023-04-27 DIAGNOSIS — I517 Cardiomegaly: Secondary | ICD-10-CM

## 2023-04-27 NOTE — Telephone Encounter (Signed)
 Reviewed stress test results with patient, patient verbalizes understanding that stress test is normal. Reviewed that noncardiac portion of stress PET/CT shows no acute findings.  There does appear to be coronary artery disease and slight enlargement of the heart.  2D echo in 2022 showed normal LV function and size and Dr. Shlomo recommends repeat 2D echo for possible cardiomegaly.   Patient verbalizes understanding and agrees to plan.

## 2023-04-27 NOTE — Telephone Encounter (Signed)
-----   Message from Armanda Magic sent at 04/25/2023  7:22 AM EST ----- Please let patient know that stress test was fine

## 2023-05-01 DIAGNOSIS — N1832 Chronic kidney disease, stage 3b: Secondary | ICD-10-CM | POA: Diagnosis not present

## 2023-05-02 ENCOUNTER — Telehealth: Payer: Self-pay | Admitting: Cardiology

## 2023-05-02 DIAGNOSIS — Z79899 Other long term (current) drug therapy: Secondary | ICD-10-CM

## 2023-05-02 DIAGNOSIS — I10 Essential (primary) hypertension: Secondary | ICD-10-CM

## 2023-05-02 NOTE — Telephone Encounter (Signed)
 Paper Work Dropped Off: Blood Pressure Reading   Date:05/02/2023  Location of paper: [facility] mail box

## 2023-05-03 ENCOUNTER — Other Ambulatory Visit (HOSPITAL_COMMUNITY): Payer: PPO

## 2023-05-03 NOTE — Telephone Encounter (Signed)
Per Dr. Mayford Knife, increase micardis to 80 mg daily. Patient will need to check her BP twice a day  for 1 week and call with BPs. Patient will need BMET in one week.    Left message for patient to call back. Will tell her of the above advisement when she calls back.

## 2023-05-08 MED ORDER — TELMISARTAN 80 MG PO TABS: 80.0000 mg | ORAL_TABLET | Freq: Every day | ORAL | 3 refills | Status: DC

## 2023-05-08 NOTE — Telephone Encounter (Signed)
Patient is returning call and is requesting call back.  

## 2023-05-08 NOTE — Addendum Note (Signed)
Addended by: Luellen Pucker on: 05/08/2023 03:42 PM   Modules accepted: Orders

## 2023-05-08 NOTE — Telephone Encounter (Signed)
Patient calling back to discuss Dr. Norris Cross recommendations. Per Dr. Mayford Knife, patient to increase micardis to 80 mg daily. Patient agrees to check her BP twice a day for 1 week and call with BPs. Patient will complete BMET in one week. Orders placed, labs released.

## 2023-05-13 ENCOUNTER — Emergency Department (HOSPITAL_COMMUNITY)
Admission: EM | Admit: 2023-05-13 | Discharge: 2023-05-14 | Disposition: A | Discharge status: Home / Self Care | Payer: PPO | Attending: Emergency Medicine | Admitting: Emergency Medicine

## 2023-05-13 DIAGNOSIS — Z7982 Long term (current) use of aspirin: Secondary | ICD-10-CM | POA: Insufficient documentation

## 2023-05-13 DIAGNOSIS — I158 Other secondary hypertension: Secondary | ICD-10-CM | POA: Diagnosis not present

## 2023-05-13 DIAGNOSIS — I2510 Atherosclerotic heart disease of native coronary artery without angina pectoris: Secondary | ICD-10-CM | POA: Diagnosis not present

## 2023-05-13 DIAGNOSIS — Z79899 Other long term (current) drug therapy: Secondary | ICD-10-CM | POA: Insufficient documentation

## 2023-05-13 DIAGNOSIS — R030 Elevated blood-pressure reading, without diagnosis of hypertension: Secondary | ICD-10-CM | POA: Diagnosis present

## 2023-05-13 DIAGNOSIS — N189 Chronic kidney disease, unspecified: Secondary | ICD-10-CM | POA: Diagnosis not present

## 2023-05-13 NOTE — ED Triage Notes (Signed)
Patient arrived with complaints of high blood pressure and feeling shaky declines any other symptoms, seen two weeks ago for same. With EMS blood pressure on left arm was reading 184/86 and 226/80 on the right.

## 2023-05-13 NOTE — ED Provider Notes (Incomplete)
[place] EMERGENCY DEPARTMENT AT [facility] Provider Note  CSN: [id number] Arrival date & time: 05/13/23 2250  Chief Complaint(s) Hypertension  HPI Brooke [name] is a 89 y.o. female {Add pertinent medical, surgical, social history, OB history to HPI:1}    Hypertension    Past Medical History Past Medical History:  Diagnosis Date  . Aortic insufficiency    mild by cMRI 11/2021  . CAD (coronary artery disease), native coronary artery    coronary Ca score 153 with mild calcified plaque in the proximal LAD 25-49% by coronary CTA 08/2020  . Chest pain   . Chronic kidney disease   . Diverticulitis   . HTN (hypertension)   . IBS (irritable bowel syndrome)   . Osteopenia    Patient Active Problem List   Diagnosis Date Noted  . Chronic calculous cholecystitis 12/15/2021  . CAD (coronary artery disease), native coronary artery   . Hypertensive heart disease 06/08/2016  . Aortic insufficiency 03/22/2015  . Essential hypertension 03/22/2015  . LVH (left ventricular hypertrophy) 03/22/2015  . Abnormal PFT 09/17/2014  . SOB (shortness of breath) 09/17/2014  . DOE (dyspnea on exertion) 09/08/2014   Home Medication(s) Prior to Admission medications   Medication Sig Start Date End Date Taking? Authorizing Provider  acetaminophen (TYLENOL) 325 MG tablet Take 325 mg by mouth every 6 (six) hours as needed for moderate pain.    [provider]  alendronate (FOSAMAX) 70 MG tablet Take 70 mg by mouth every Tuesday. 11/04/21   [provider]  aspirin 81 MG tablet Take 81 mg by mouth daily.    [provider]  Calcium-Phosphorus-Vitamin D (CITRACAL +D3 PO) Take 1 tablet by mouth daily.    [provider]  carvedilol (COREG) 12.5 MG tablet Take 1 tablet (12.5 mg total) by mouth 2 (two) times daily. 03/05/23 06/03/23  Quintella Reichert, MD  Cholecalciferol (VITAMIN D-3) 1000 UNITS CAPS Take 1,000 Units by mouth every other day.    [provider]  colchicine 0.6 MG tablet Take 0.6 mg by mouth daily as needed (gout). 09/08/21   [provider]  cyanocobalamin (VITAMIN B12) 1000 MCG tablet Take 1,000 mcg by mouth daily.    [provider]  diphenhydrAMINE-APAP, sleep, (TYLENOL PM EXTRA STRENGTH PO) Take 1 tablet by mouth at bedtime.    [provider]  famotidine (PEPCID) 40 MG tablet Take 40 mg by mouth daily.    [provider]  levothyroxine (SYNTHROID) 25 MCG tablet Take 25 mcg by mouth daily before breakfast.    [provider]  Loperamide-Simethicone (IMODIUM MULTI-SYMPTOM RELIEF) 2-125 MG TABS Take 0.5 tablets by mouth daily as needed (Upset stomach).    [provider]  Multiple Vitamins-Minerals (CENTRUM SILVER PO) Take 1 tablet by mouth daily.    [provider]  PFIZER-BIONT COVID-19 VAC-TRIS SUSP injection  08/07/20   [provider]  rosuvastatin (CRESTOR) 10 MG tablet Take 1 tablet (10 mg total) by mouth daily. 11/28/22   Gaston Islam., NP  telmisartan (MICARDIS) 80 MG tablet Take 1 tablet (80 mg total) by mouth daily. 05/08/23   Quintella Reichert, MD  Allergies Macrobid [nitrofurantoin monohyd macro], Sulfa antibiotics, Amlodipine, and Ciprofloxacin  Review of Systems Review of Systems As noted in HPI  Physical Exam Vital Signs  I have reviewed the triage vital signs BP (!) 187/74   Pulse 71   Temp (!) 97.5 F (36.4 C) (Oral)   Resp 18   SpO2 97%  *** Physical Exam  ED Results and Treatments Labs (all labs ordered are listed, but only abnormal results are displayed) Labs Reviewed - No data to display                                                                                                                       EKG  EKG Interpretation Date/Time:    Ventricular Rate:    PR Interval:    QRS  Duration:    QT Interval:    QTC Calculation:   R Axis:      Text Interpretation:         Radiology No results found.  Medications Ordered in ED Medications - No data to display Procedures Procedures  (including critical care time) Medical Decision Making / ED Course   Medical Decision Making   ***    Final Clinical Impression(s) / ED Diagnoses Final diagnoses:  None    This chart was dictated using voice recognition software.  Despite best efforts to proofread,  errors can occur which can change the documentation meaning.

## 2023-05-14 ENCOUNTER — Telehealth: Payer: Self-pay | Admitting: Cardiology

## 2023-05-14 MED ORDER — ACETAMINOPHEN 325 MG PO TABS
650.0000 mg | ORAL_TABLET | Freq: Once | ORAL | Status: DC
  Filled 2023-05-14: qty 2

## 2023-05-14 MED ORDER — IRBESARTAN 150 MG PO TABS
150.0000 mg | ORAL_TABLET | Freq: Once | ORAL | Status: AC
  Administered 2023-05-14: 150 mg via ORAL
  Filled 2023-05-14: qty 1

## 2023-05-14 NOTE — Telephone Encounter (Signed)
Patient calling in with questions about bp medication management.  She reports that on 05/08/23 she was started on telmisartan 80 mg daily. She tracked her BP log as directed and reports the following:  05/08/23  114/50 HR 71 at 12:12 pm 129/60 HR 65 at 10:00 pm  05/09/23 105/51 HR 59 at 11:45 PM 121/57 HR 65 at 10:45 PM  05/10/23 139/60 HR 67 at 9:45 AM 140/59 HR 68 at 3:25 PM  05/11/23 165/62 HR 66 at 9:20 PM  05/12/23 152/61 HR  64 at 3 PM  05/13/23 155/62 HR 57 at 12:15 PM 184/67 at 8:50 PM  Patient became very concerned about BP of 184/67, though she denies any headache, dizziness, blurry vision or weakness. She called 911 and was treated in the ER. The MD who treated her advised her to take 40 mg telmisartan in the AM and 40 mg telmisartan in the PM and follow up with Dr. Mayford Knife.   Patient left the ED at 4 AM today and went home. She reports that she took 40 mg telmisartan and 12.5 mg coreg this morning and her BP was 149/64 at 8 AM. She did have some dizziness at 10:45 and got a BP reading of 82/40. At 11:30 her dizziness started to subside and she got a BP reading of 124/54. She endorses eating and drinking normally today and would like to know if Dr. Mayford Knife has further instructions for her.

## 2023-05-14 NOTE — ED Provider Notes (Signed)
[place] EMERGENCY DEPARTMENT AT [facility] Provider Note  CSN: [id number] Arrival date & time: 05/13/23 2250  Chief Complaint(s) Hypertension  HPI Brooke [name] is a 89 y.o. female here for elevated blood pressure readings at home.  Patient reports that she had recent increase in her Micardis from 40 mg daily to 80 mg daily.  Reports that she has been checking her blood pressures twice a day and ranging anywhere from systolics of 130s to 170s.  This evening, she checked her blood pressure and it was in the 180s.  The blood pressure being that high made her concerned prompting a call to EMS.  She reports being asymptomatic until EMS obtain blood pressures that show systolics in the 200s.  At that time she became shaky which she attributes to be anxious.  She denied any associated headache, nausea, chest pain, shortness of breath.  Currently she is asymptomatic.  Reports that she has not missed any doses of her blood pressure medicine.  She did admit to eating barbecue and coleslaw brought by her son from the restaurant.  States that likely had high salt content.  Denied any recent fevers or infections.  No OTC use of decongestants.  The history is provided by the patient.    Past Medical History Past Medical History:  Diagnosis Date   Aortic insufficiency    mild by cMRI 11/2021   CAD (coronary artery disease), native coronary artery    coronary Ca score 153 with mild calcified plaque in the proximal LAD 25-49% by coronary CTA 08/2020   Chest pain    Chronic kidney disease    Diverticulitis    HTN (hypertension)    IBS (irritable bowel syndrome)    Osteopenia    Patient Active Problem List   Diagnosis Date Noted   Chronic calculous cholecystitis 12/15/2021   CAD (coronary artery disease), native coronary artery    Hypertensive heart disease 06/08/2016   Aortic insufficiency 03/22/2015   Essential hypertension 03/22/2015   LVH (left ventricular hypertrophy) 03/22/2015    Abnormal PFT 09/17/2014   SOB (shortness of breath) 09/17/2014   DOE (dyspnea on exertion) 09/08/2014   Home Medication(s) Prior to Admission medications   Medication Sig Start Date End Date Taking? Authorizing Provider  acetaminophen (TYLENOL) 325 MG tablet Take 325 mg by mouth every 6 (six) hours as needed for moderate pain.    [provider]  alendronate (FOSAMAX) 70 MG tablet Take 70 mg by mouth every Tuesday. 11/04/21   [provider]  aspirin 81 MG tablet Take 81 mg by mouth daily.    [provider]  Calcium-Phosphorus-Vitamin D (CITRACAL +D3 PO) Take 1 tablet by mouth daily.    [provider]  carvedilol (COREG) 12.5 MG tablet Take 1 tablet (12.5 mg total) by mouth 2 (two) times daily. 03/05/23 06/03/23  Quintella Reichert, MD  Cholecalciferol (VITAMIN D-3) 1000 UNITS CAPS Take 1,000 Units by mouth every other day.    [provider]  colchicine 0.6 MG tablet Take 0.6 mg by mouth daily as needed (gout). 09/08/21   [provider]  cyanocobalamin (VITAMIN B12) 1000 MCG tablet Take 1,000 mcg by mouth daily.    [provider]  diphenhydrAMINE-APAP, sleep, (TYLENOL PM EXTRA STRENGTH PO) Take 1 tablet by mouth at bedtime.    [provider]  famotidine (PEPCID) 40 MG tablet Take 40 mg by mouth daily.    [provider]  levothyroxine (SYNTHROID) 25 MCG tablet Take 25  mcg by mouth daily before breakfast.    [provider]  Loperamide-Simethicone (IMODIUM MULTI-SYMPTOM RELIEF) 2-125 MG TABS Take 0.5 tablets by mouth daily as needed (Upset stomach).    [provider]  Multiple Vitamins-Minerals (CENTRUM SILVER PO) Take 1 tablet by mouth daily.    [provider]  PFIZER-BIONT COVID-19 VAC-TRIS SUSP injection  08/07/20   [provider]  rosuvastatin (CRESTOR) 10 MG tablet Take 1 tablet (10 mg total) by mouth daily. 11/28/22   Gaston Islam., NP  telmisartan (MICARDIS) 80 MG  tablet Take 1 tablet (80 mg total) by mouth daily. 05/08/23   Quintella Reichert, MD                                                                                                                                    Allergies Macrobid [nitrofurantoin monohyd macro], Sulfa antibiotics, Amlodipine, and Ciprofloxacin  Review of Systems Review of Systems As noted in HPI  Physical Exam Vital Signs  I have reviewed the triage vital signs BP (!) 150/66 (BP Location: Right Arm)   Pulse 64   Temp (!) 97.1 F (36.2 C) (Oral)   Resp 18   SpO2 96%   Physical Exam Vitals reviewed.  Constitutional:      General: She is not in acute distress.    Appearance: She is well-developed. She is not diaphoretic.  HENT:     Head: Normocephalic and atraumatic.     Nose: Nose normal.  Eyes:     General: No scleral icterus.       Right eye: No discharge.        Left eye: No discharge.     Conjunctiva/sclera: Conjunctivae normal.     Pupils: Pupils are equal, round, and reactive to light.  Cardiovascular:     Rate and Rhythm: Normal rate and regular rhythm.     Heart sounds: No murmur heard.    No friction rub. No gallop.  Pulmonary:     Effort: Pulmonary effort is normal. No respiratory distress.     Breath sounds: Normal breath sounds. No stridor. No rales.  Abdominal:     General: There is no distension.     Palpations: Abdomen is soft.     Tenderness: There is no abdominal tenderness.  Musculoskeletal:        General: No tenderness.     Cervical back: Normal range of motion and neck supple.  Skin:    General: Skin is warm and dry.     Findings: No erythema or rash.  Neurological:     Mental Status: She is alert and oriented to person, place, and time.     ED Results and Treatments Labs (all labs ordered are listed, but only abnormal results are displayed) Labs Reviewed - No data to display  EKG  EKG Interpretation Date/Time:    Ventricular Rate:    PR Interval:    QRS Duration:    QT Interval:    QTC Calculation:   R Axis:      Text Interpretation:         Radiology No results found.  Medications Ordered in ED Medications  acetaminophen (TYLENOL) tablet 650 mg (650 mg Oral Patient Refused/Not Given 05/14/23 0113)  irbesartan (AVAPRO) tablet 150 mg (150 mg Oral Given 05/14/23 0110)   Procedures Procedures  (including critical care time) Medical Decision Making / ED Course   Medical Decision Making Risk OTC drugs. Prescription drug management.    Patient presented with asymptomatic hypertension.  Patient is extremely anxious with the readings.  Notable increase when she is more anxious.   Initial blood pressure in the 200s however this was related to cuff size.  When appropriate cuff place, patient's blood pressure dropped to the 180s.  She was monitored with blood pressures in the 170s.  Given a dose of Avapro equivalent to 40 mg of Micardis.  Blood pressures rapidly improved to the 140s.  Within minutes of being brought back to a treatment room.  She was monitored for additional several hours with stable blood pressures.  No need for workup at this time.  Patient is going to continue checking her blood pressures at home.  Recommended splitting her dose of Micardis 40 mg in the morning and for 40 milligrams in the evening.      Final Clinical Impression(s) / ED Diagnoses Final diagnoses:  Other secondary hypertension   The patient appears reasonably screened and/or stabilized for discharge and I doubt any other medical condition or other [facility] requiring further screening, evaluation, or treatment in the ED at this time. I have discussed the findings, Dx and Tx plan with the patient/family who expressed understanding and agree(s) with the plan. Discharge instructions discussed at length. The patient/family was given strict return precautions  who verbalized understanding of the instructions. No further questions at time of discharge.  Disposition: Discharge  Condition: Good  ED Discharge Orders     None        Follow Up: Mila Palmer, MD [address] [place] [address] [phone]  Call  to schedule an appointment for close follow up  Quintella Reichert, MD [address] [place] [address] [phone]  Go to  as scheduled    This chart was dictated using voice recognition software.  Despite best efforts to proofread,  errors can occur which can change the documentation meaning.    Nira Conn, MD 05/14/23 [phone]

## 2023-05-14 NOTE — Telephone Encounter (Signed)
Patient states she was in the ED last night and she would like to know if Dr. Mayford Knife can review the notes. Please advise.

## 2023-05-15 DIAGNOSIS — I10 Essential (primary) hypertension: Secondary | ICD-10-CM | POA: Diagnosis not present

## 2023-05-15 DIAGNOSIS — Z79899 Other long term (current) drug therapy: Secondary | ICD-10-CM | POA: Diagnosis not present

## 2023-05-15 NOTE — Telephone Encounter (Signed)
Call to patient to advise that Dr. Mayford Knife recommends she is seen by APP. No answer, left detailed message per DPR explaining I had booked an appt for her w/ E. Dick NP at 10:05 on 05/17/23. Asked patient to call back to confirm whether she can make this appointment.

## 2023-05-16 LAB — BASIC METABOLIC PANEL
BUN/Creatinine Ratio: 16 (ref 12–28)
BUN: 20 mg/dL (ref 8–27)
CO2: 23 mmol/L (ref 20–29)
Calcium: 9.3 mg/dL (ref 8.7–10.3)
Chloride: 104 mmol/L (ref 96–106)
Creatinine, Ser: 1.22 mg/dL — ABNORMAL HIGH (ref 0.57–1.00)
Glucose: 129 mg/dL — ABNORMAL HIGH (ref 70–99)
Potassium: 4.6 mmol/L (ref 3.5–5.2)
Sodium: 141 mmol/L (ref 134–144)
eGFR: 43 mL/min/{1.73_m2} — ABNORMAL LOW (ref >59)

## 2023-05-16 NOTE — Progress Notes (Unsigned)
Cardiology Office Note    Patient Name: Brooke [name] Date of Encounter: 05/17/2023  Primary Care Provider:  Mila Palmer, MD Primary Cardiologist:  Armanda Magic, MD Primary Electrophysiologist: None   Past Medical History    Past Medical History:  Diagnosis Date   Aortic insufficiency    mild by cMRI 11/2021   CAD (coronary artery disease), native coronary artery    coronary Ca score 153 with mild calcified plaque in the proximal LAD 25-49% by coronary CTA 08/2020   Chest pain    Chronic kidney disease    Diverticulitis    HTN (hypertension)    IBS (irritable bowel syndrome)    Osteopenia     History of Present Illness  Brooke [name]  is a 89 year old female with a PMH of nonobstructive CAD, mild AI, HTN, HLD who presents today for elevated BP.    Brooke [name] was last seen on 11/28/2022 for annual follow-up.  During her visit she endorsed some discomfort with activity along with slightly elevated BP at 146/62.  She had previously had beta-blocker adjusted by her PCP Toprol-XL was discontinued and carvedilol 3.125 mg was started for better BP control.  She was seen by Dr. Mayford Knife on 03/05/2023 for follow-up visit.  During visit patient endorsed chronic shortness of breath and chronic chest pain that was sporadic.  She underwent a PET stress test to rule out ischemia and microvascular disease.  She also had carvedilol increased to 12.5 mg twice daily stress test results showed normal LV perfusion with no evidence of ischemia and no evidence of microvascular disease.  She was seen in the ED on 05/14/2023 for hypertension.  She reported BP elevated in the 180s systolically and was brought to the ED via EMS.  She was asymptomatic and did endorse some sodium indiscretions but is otherwise asymptomatic.  She was given Avapro with improvement to BP and patient was discharged with recommendation to split Micardis to 40 mg in the a.m. and 40 mg in the p.m.  [name] presents today for  post ED follow-up.  She reports doing well today and blood pressures were stable at 120/50.  She brought in a list of her blood pressures that showed better control with her divided dose of Micardis.  She has noted some ongoing chest pressure with ambulation that requires rest.  She also revealed today that she has been under a lot of stress with her husband who has dementia.  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  We discussed the importance of seeking support with friends and she revealed recently joining a support group.  We discussed the results of her PET stress test and all questions were answered to her satisfaction.  We discussed the importance of monitoring blood pressures at home and not seeking care in the ED unless an acute change occurs or she becomes symptomatic.  She is scheduled to have a 2D echo completed in 2 weeks for further evaluation of shortness of breath.    Patient denies chest pain, palpitations, dyspnea, PND, orthopnea, nausea, vomiting, dizziness, syncope, edema, weight gain, or early satiety.  Review of Systems  Please see the history of present illness.    All other systems reviewed and are otherwise negative except as noted above.  Physical Exam    Wt Readings from Last 3 Encounters:  05/17/23 152 lb (68.9 kg)  03/05/23 151 lb 3.2 oz (68.6 kg)  11/28/22 151 lb  6.4 oz (68.7 kg)   VS: Vitals:   05/17/23 1009  BP: (!) 120/50  Pulse: 73  SpO2: 100%  ,Body mass index is 23.81 kg/m. GEN: Well nourished, well developed in no acute distress Neck: No JVD; No carotid bruits Pulmonary: Clear to auscultation without rales, wheezing or rhonchi  Cardiovascular: Normal rate. Regular rhythm. Normal S1. Normal S2.   Murmurs: There is no murmur.  ABDOMEN: Soft, non-tender, non-distended EXTREMITIES:  No edema; No deformity   EKG/LABS/ Recent Cardiac Studies   ECG personally reviewed by me today -none completed today  Risk  Assessment/Calculations:          Lab Results  Component Value Date   WBC 7.5 04/24/2023   HGB 12.4 04/24/2023   HCT 38.0 04/24/2023   MCV 93.8 04/24/2023   PLT 194 04/24/2023   Lab Results  Component Value Date   CREATININE 1.22 (H) 05/15/2023   BUN 20 05/15/2023   NA 141 05/15/2023   K 4.6 05/15/2023   CL 104 05/15/2023   CO2 23 05/15/2023   Lab Results  Component Value Date   CHOL 104 10/11/2020   HDL 46 10/11/2020   LDLCALC 37 10/11/2020   TRIG 118 10/11/2020   CHOLHDL 2.3 10/11/2020    No results found for: "HGBA1C" Assessment & Plan    1.Essential hypertension: -Patient's blood pressure today was 120/50 -Continue Micardis 40 mg BID and Coreg 12.5 mg BID  2.  Coronary artery disease: -s/p PET stress test completed on 04/2023 with no evidence of ischemia or microvascular disease present. -He reports chest pressure but denies any chest pain with ambulation. -She also reports being under increased amount of stress with caring for her husband with dementia. -Continue current GDMT with ASA 81 mg, Crestor 10  3.  Hyperlipidemia:  -Last LDL cholesterol was low at 38 Continue Crestor 10 mg daily  4. Nonrheumatic AI: -Mild AR present by MRA completed 11/2021 -Continue current GDMT with beta-blocker and Crestor 10 mg -2D echo scheduled for 05/24/2023  5.  Anxiety: -Patient reports increased verbal strain with caring for her husband who has dementia. -We discussed the possible correlation of elevated BP with her increased stress and anxiety. -She was encouraged to follow-up with PCP regarding medications and recommendations for care.  Disposition: Follow-up with Armanda Magic, MD or APP in 6 months    Signed, Napoleon Form, Leodis Rains, NP 05/17/2023, 10:26 AM [facility]

## 2023-05-17 ENCOUNTER — Encounter: Payer: Self-pay | Admitting: Nurse Practitioner

## 2023-05-17 ENCOUNTER — Ambulatory Visit: Payer: PPO | Attending: Nurse Practitioner | Admitting: Nurse Practitioner

## 2023-05-17 VITALS — BP 120/50 | HR 73 | Ht 67.0 in | Wt 152.0 lb

## 2023-05-17 DIAGNOSIS — F419 Anxiety disorder, unspecified: Secondary | ICD-10-CM

## 2023-05-17 DIAGNOSIS — I2510 Atherosclerotic heart disease of native coronary artery without angina pectoris: Secondary | ICD-10-CM | POA: Diagnosis not present

## 2023-05-17 DIAGNOSIS — I10 Essential (primary) hypertension: Secondary | ICD-10-CM | POA: Diagnosis not present

## 2023-05-17 DIAGNOSIS — E7800 Pure hypercholesterolemia, unspecified: Secondary | ICD-10-CM

## 2023-05-17 DIAGNOSIS — I351 Nonrheumatic aortic (valve) insufficiency: Secondary | ICD-10-CM | POA: Diagnosis not present

## 2023-05-17 NOTE — Patient Instructions (Signed)
Medication Instructions:  Your physician recommends that you continue on your current medications as directed. Please refer to the Current Medication list given to you today.  *If you need a refill on your cardiac medications before your next appointment, please call your pharmacy*   Lab Work: None ordered  If you have labs (blood work) drawn today and your tests are completely normal, you will receive your results only by: MyChart Message (if you have MyChart) OR A paper copy in the mail If you have any lab test that is abnormal or we need to change your treatment, we will call you to review the results.   Testing/Procedures: None ordered   Follow-Up: At [facility], you and your health needs are our priority.  As part of our continuing mission to provide you with exceptional heart care, we have created designated Provider Care Teams.  These Care Teams include your primary Cardiologist (physician) and Advanced Practice Providers (APPs -  Physician Assistants and Nurse Practitioners) who all work together to provide you with the care you need, when you need it.  We recommend signing up for the patient portal called "MyChart".  Sign up information is provided on this After Visit Summary.  MyChart is used to connect with patients for Virtual Visits (Telemedicine).  Patients are able to view lab/test results, encounter notes, upcoming appointments, etc.  Non-urgent messages can be sent to your provider as well.   To learn more about what you can do with MyChart, go to [email or web address].    Your next appointment:   6 month(s)  Provider:   Armanda Magic, MD     Other Instructions   1st Floor: - Lobby - Registration  - Pharmacy  - Lab - Cafe  2nd Floor: - PV Lab - Diagnostic Testing (echo, CT, nuclear med)  3rd Floor: - Vacant  4th Floor: - TCTS (cardiothoracic surgery) - AFib Clinic - Structural Heart Clinic - Vascular Surgery  - Vascular  Ultrasound  5th Floor: - HeartCare Cardiology (general and EP) - Clinical Pharmacy for coumadin, hypertension, lipid, weight-loss medications, and med management appointments    Valet parking services will be available as well.

## 2023-05-21 NOTE — Telephone Encounter (Signed)
Patient completed visit w/ APP on 05/17/23.

## 2023-05-24 ENCOUNTER — Encounter: Payer: Self-pay | Admitting: Cardiology

## 2023-05-24 ENCOUNTER — Ambulatory Visit (HOSPITAL_COMMUNITY): Payer: PPO | Attending: Internal Medicine

## 2023-05-24 DIAGNOSIS — I517 Cardiomegaly: Secondary | ICD-10-CM | POA: Diagnosis present

## 2023-05-24 DIAGNOSIS — I371 Nonrheumatic pulmonary valve insufficiency: Secondary | ICD-10-CM | POA: Insufficient documentation

## 2023-05-24 LAB — ECHOCARDIOGRAM COMPLETE
AR max vel: 2.02 cm2
AV Area VTI: 1.73 cm2
AV Area mean vel: 1.9 cm2
AV Mean grad: 7.7 mm[Hg]
AV Peak grad: 13.2 mm[Hg]
Ao pk vel: 1.81 m/s
Area-P 1/2: 3.31 cm2
P 1/2 time: 520 ms
S' Lateral: 2.2 cm

## 2023-05-30 ENCOUNTER — Telehealth: Payer: Self-pay

## 2023-05-30 DIAGNOSIS — R0609 Other forms of dyspnea: Secondary | ICD-10-CM

## 2023-05-30 NOTE — Telephone Encounter (Signed)
Left a message to call back.   Would like to review MD recommendation to have a stress echocardiogram.

## 2023-05-30 NOTE — Telephone Encounter (Signed)
-----   Message from Brooke [name] sent at 05/26/2023  7:29 PM EST ----- [name], can we get her in for a stress echo (LVOT gradient)?  Dr. Mayford Knife and I talked; she has obstructive gradients (non severe) with her prior echo, none performed on her recent study.  Has DOE and some HCM sx.  Thanks, MAC ----- Message ----- From: Quintella Reichert, MD Sent: 05/24/2023   9:19 PM EST To: Brooke Constant, MD  Mahesh  This patient you read out has been complaining of SOB.  EF hyperdynamic with LVOT turbulence but no LVOT gradient noted and no SAM.  cMRI a while back with no evidence of HOCM and severe concentric LVF.  BP well controlled - anything you think I may be missing - coronary CTA ok.  SHould I do stress echo with LVOT gradients? ----- Message ----- From: Interface, [employer] Sent: 05/24/2023   2:54 PM EST To: Quintella Reichert, MD

## 2023-06-04 ENCOUNTER — Telehealth: Payer: Self-pay

## 2023-06-04 NOTE — Telephone Encounter (Signed)
-----   Message from Armanda Magic sent at 05/24/2023  9:17 PM EST ----- Echo showed hyperdynamic LVF EF 70-75% with severely thickened heart muscle, mildly leaky mitral valve and aortic valve and moderately leaky PV>>compared to prior echo the leaky PVis new - repeat echo in 1 year for PR

## 2023-06-04 NOTE — Telephone Encounter (Signed)
Call to patient to discuss echo results. Per Dr. Mayford Knife, explained that echo showed hyperdynamic LVF EF 70-75% with severely thickened heart muscle, mildly leaky mitral valve and aortic valve and moderately leaky PV. Patient verbalizes understanding that compared to prior echo the leaky PVis new  and agrees to repeat echo in 1 year for PR. Order placed.   Patient also states she is concerned that about her chronic symptoms and is wondering if further medication adjustment could alleviate symptoms, appt made for 07/26/23.

## 2023-06-12 NOTE — Telephone Encounter (Signed)
 Called pt advised of MD recommendation for stress echocardiogram.  Pt is agreeable to complete testing.  Instructions reviewed with pt advised to contact our office with questions/concerns.

## 2023-06-15 ENCOUNTER — Other Ambulatory Visit: Payer: Self-pay | Admitting: Internal Medicine

## 2023-06-15 DIAGNOSIS — I517 Cardiomegaly: Secondary | ICD-10-CM

## 2023-06-20 ENCOUNTER — Encounter (HOSPITAL_COMMUNITY): Payer: Self-pay

## 2023-06-26 ENCOUNTER — Telehealth (HOSPITAL_COMMUNITY): Payer: Self-pay | Admitting: *Deleted

## 2023-06-26 NOTE — Telephone Encounter (Signed)
Patient given instructions for upcoming Stress Echo.  Brooke [name]

## 2023-06-27 ENCOUNTER — Other Ambulatory Visit: Payer: Self-pay | Admitting: Internal Medicine

## 2023-06-27 ENCOUNTER — Ambulatory Visit (HOSPITAL_BASED_OUTPATIENT_CLINIC_OR_DEPARTMENT_OTHER)

## 2023-06-27 ENCOUNTER — Ambulatory Visit (HOSPITAL_COMMUNITY): Attending: Cardiovascular Disease

## 2023-06-27 DIAGNOSIS — R0609 Other forms of dyspnea: Secondary | ICD-10-CM | POA: Diagnosis not present

## 2023-06-27 LAB — ECHOCARDIOGRAM STRESS TEST: S' Lateral: 1.35 cm

## 2023-06-28 ENCOUNTER — Telehealth: Payer: Self-pay | Admitting: Cardiology

## 2023-06-28 DIAGNOSIS — I517 Cardiomegaly: Secondary | ICD-10-CM

## 2023-06-28 NOTE — Telephone Encounter (Signed)
Pt returning call to nurse for results

## 2023-06-28 NOTE — Telephone Encounter (Signed)
 Spoke with pt. Results given and agreed to see Dr Izora Ribas. Referral placed.

## 2023-07-26 ENCOUNTER — Encounter: Payer: Self-pay | Admitting: Cardiology

## 2023-07-26 ENCOUNTER — Ambulatory Visit: Payer: PPO | Attending: Cardiology | Admitting: Cardiology

## 2023-07-26 VITALS — BP 124/60 | HR 58 | Ht 67.0 in | Wt 151.8 lb

## 2023-07-26 DIAGNOSIS — I340 Nonrheumatic mitral (valve) insufficiency: Secondary | ICD-10-CM | POA: Diagnosis not present

## 2023-07-26 DIAGNOSIS — I10 Essential (primary) hypertension: Secondary | ICD-10-CM | POA: Diagnosis not present

## 2023-07-26 DIAGNOSIS — E7800 Pure hypercholesterolemia, unspecified: Secondary | ICD-10-CM | POA: Diagnosis not present

## 2023-07-26 DIAGNOSIS — R0609 Other forms of dyspnea: Secondary | ICD-10-CM

## 2023-07-26 DIAGNOSIS — I371 Nonrheumatic pulmonary valve insufficiency: Secondary | ICD-10-CM | POA: Diagnosis not present

## 2023-07-26 DIAGNOSIS — I2510 Atherosclerotic heart disease of native coronary artery without angina pectoris: Secondary | ICD-10-CM

## 2023-07-26 DIAGNOSIS — I351 Nonrheumatic aortic (valve) insufficiency: Secondary | ICD-10-CM

## 2023-07-26 NOTE — Progress Notes (Signed)
 Cardiology office note    Date:  07/26/2023   ID:  Brooke [name], DOB [birth year], MRN [id number]  PCP:  Mila Palmer, MD  Cardiologist:  Armanda Magic, MD   Chief Complaint  Patient presents with   Mitral Regurgitation   Aortic Insuffiency   Hypertension   Coronary Artery Disease   Hyperlipidemia    History of Present Illness:  Brooke [name] is a 89 y.o. female with a hx of HTN and AI who was seen in 2016 for DOE and chest pain.  She has a hx of AI that was mild by echo in 2018 and normal myoview in 2016 with hypertensive BP response to exercise and poor exercise capacity was started on amlodipine. It was recommended that she get into a routine exercise program.   She underwent coronary CTA May 2022 showing coronary calcium score 153 with nonobstructive CAD with 25 to 49% calcified plaque in the proximal LAD and 25 to 49% soft plaque in the distal LAD after the D2 and before the D3 and otherwise normal coronary arteries.  2D echo done 09/03/2020 showed hyperdynamic LV function with EF 75% and grade 1 diastolic dysfunction with severe LVH and normal LV strain.  There was mild to moderate AI.    She was seen by Robin Searing, NP in August 2020 for for chronic noncardiac chest pain that occurs when bending down and with certain activities..  She also has chronic shortness of breath and had a cardiac MRI in 2023 showing no evidence of HOCM or other infiltrative cardiac disease with no LGE.  There was mild AI. She had a Stress PET CT 04/2023 that showed no ischemia or microvascular disease and normal LVF.  She is here today for followup and is doing well.  She has chronic SOB everyday when she exerts herself to go out to get the trash cans, walking or pulling weeks.  She denies any chest pain or pressure, PND, orthopnea, LE edema, palpitations or syncope. Occasionally she will have some dizziness when she stands up too fast. She is compliant with her meds and is tolerating meds with no SE.     Past Medical History:  Diagnosis Date   Aortic insufficiency    mild by cMRI 11/2021   CAD (coronary artery disease), native coronary artery    coronary Ca score 153 with mild calcified plaque in the proximal LAD 25-49% by coronary CTA 08/2020   Chest pain    Chronic kidney disease    Diverticulitis    HTN (hypertension)    IBS (irritable bowel syndrome)    Osteopenia    Pulmonic regurgitation    moderate by echo 05/2023    Past Surgical History:  Procedure Laterality Date   CARDIAC CATHETERIZATION     CHOLECYSTECTOMY N/A 12/15/2021   Procedure: LAPAROSCOPIC CHOLECYSTECTOMY;  Surgeon: Almond Lint, MD;  Location: [facility] OR;  Service: General;  Laterality: N/A;   DILATION AND CURETTAGE OF UTERUS      Current Medications: Current Meds  Medication Sig   acetaminophen (TYLENOL) 325 MG tablet Take 325 mg by mouth every 6 (six) hours as needed for moderate pain.   alendronate (FOSAMAX) 70 MG tablet Take 70 mg by mouth every Tuesday.   aspirin 81 MG tablet Take 81 mg by mouth daily.   Calcium-Phosphorus-Vitamin D (CITRACAL +D3 PO) Take 1 tablet by mouth daily.   Cholecalciferol (VITAMIN D-3) 1000 UNITS CAPS Take 1,000 Units by mouth daily at 6 (six) AM.   colchicine  0.6 MG tablet Take 0.6 mg by mouth daily as needed (gout).   cyanocobalamin (VITAMIN B12) 1000 MCG tablet Take 1,000 mcg by mouth every other day.   diphenhydrAMINE-APAP, sleep, (TYLENOL PM EXTRA STRENGTH PO) Take 1 tablet by mouth at bedtime.   famotidine (PEPCID) 40 MG tablet Take 40 mg by mouth at bedtime.   levothyroxine (SYNTHROID) 25 MCG tablet Take 25 mcg by mouth daily before breakfast.   Loperamide-Simethicone (IMODIUM MULTI-SYMPTOM RELIEF) 2-125 MG TABS Take 0.5 tablets by mouth daily as needed (Upset stomach).   Multiple Vitamins-Minerals (CENTRUM SILVER PO) Take 1 tablet by mouth daily.   rosuvastatin (CRESTOR) 10 MG tablet Take 1 tablet (10 mg total) by mouth daily.   telmisartan (MICARDIS) 40 MG tablet Take 40  mg by mouth 2 (two) times daily.   [DISCONTINUED] telmisartan (MICARDIS) 80 MG tablet Take 1 tablet (80 mg total) by mouth daily. (Patient taking differently: Take 40 mg by mouth daily.)    Allergies:   Macrobid [nitrofurantoin monohyd macro], Sulfa antibiotics, Amlodipine, and Ciprofloxacin   Social History   Socioeconomic History   Marital status: Married    Spouse name: Not on file   Number of children: 4   Years of education: Not on file   Highest education level: Not on file  Occupational History   Not on file  Tobacco Use   Smoking status: Never   Smokeless tobacco: Never  Vaping Use   Vaping status: Never Used  Substance and Sexual Activity   Alcohol use: Yes    Comment: occ   Drug use: No   Sexual activity: Not on file  Other Topics Concern   Not on file  Social History Narrative   Lives with husband.    Social Drivers of Corporate investment banker Strain: Not on file  Food Insecurity: Not on file  Transportation Needs: Not on file  Physical Activity: Not on file  Stress: Not on file  Social Connections: Not on file     Family History:  The patient's family history includes Colon cancer in her mother; Heart attack in her father; Heart disease in her brother; Hypertension in her brother.   ROS:   Please see the history of present illness.    ROS All other systems reviewed and are negative.      No data to display             PHYSICAL EXAM:   VS:  BP 124/60   Pulse (!) 58   Ht 5\' 7"  (1.702 m)   Wt 151 lb 12.8 oz (68.9 kg)   SpO2 99%   BMI 23.78 kg/m    GEN: Well nourished, well developed in no acute distress HEENT: Normal NECK: No JVD; No carotid bruits LYMPHATICS: No lymphadenopathy CARDIAC:RRR, no murmurs, rubs, gallops RESPIRATORY:  Clear to auscultation without rales, wheezing or rhonchi  ABDOMEN: Soft, non-tender, non-distended MUSCULOSKELETAL:  No edema; No deformity  SKIN: Warm and dry NEUROLOGIC:  Alert and oriented x  3 PSYCHIATRIC:  Normal affect  Wt Readings from Last 3 Encounters:  07/26/23 151 lb 12.8 oz (68.9 kg)  05/17/23 152 lb (68.9 kg)  03/05/23 151 lb 3.2 oz (68.6 kg)      Studies/Labs Reviewed:    Recent Labs: 04/24/2023: ALT 20; Hemoglobin 12.4; Platelets 194 05/15/2023: BUN 20; Creatinine, Ser 1.22; Potassium 4.6; Sodium 141   Lipid Panel    Component Value Date/Time   CHOL 104 10/11/2020 0938   TRIG 118 10/11/2020 0938  HDL 46 10/11/2020 0938   CHOLHDL 2.3 10/11/2020 0938   LDLCALC 37 10/11/2020 0938    Additional studies/ records that were reviewed today include:  2D echo 2018 and stress myoview 2016    ASSESSMENT:    1. Nonrheumatic aortic valve insufficiency   2. Nonrheumatic mitral valve regurgitation   3. Essential hypertension   4. DOE (dyspnea on exertion)   5. Coronary artery disease involving native coronary artery of native heart without angina pectoris   6. Pure hypercholesterolemia   7. Nonrheumatic pulmonary valve insufficiency        PLAN:  In order of problems listed above:  1.  Aortic and mitral  Insufficiency -this was mild to moderate 08/2020 -Cardiac MRI 2023 with mild AI -2D echo 05/2023 with mild AR and MR  2.  HTN -BP controlled on exam today -continue prescription drug management with Carvedilol 12.5mg  BID and Telmisartan 80mg  daily with PRN refills -I have personally reviewed and interpreted outside labs performed by patient's PCP which showed SCr 1.2 and K+ 4.6 on 05/15/2023  3.  DOE -This is she has continued to have DOE when walking and bending over -stress testing in the past as well as 04/2023 showed no ischemia or microvascular disease and normal LVF >>she has had a hypertensive BP response to exercise and poor exercise capacity in the past>>suspect this is mainly deconditioning  -2D echo 2022 with hyperdynamic LV function and grade 1 diastolic dysfunction but also severe LVH  -Coronary CTA with non obstructive CAD 2022 -Cardiac  MRI with no infiltrative cardiac disease and normal LV function -stress PET CT 04/2023 with no ischemia and no evidence of microvascular disease -she does have severe LVH with turbulence in the LVOT on echo despite cMRI with no HOCM.   -Stress echo 06/2023 showed  dynamic LVOT gradient with exercise but repetitive stand and sqat failed to demonstrated a significant gradient so inconclusive for HOCM physiology. She had very poor exercise capacity due to SOB. -I am referring her to Dr. Izora Ribas for further evaluation given that she continues to have SOB.   4.  ASCAD/Chest pain -she has chron noncardiac chest pain when out walking up inclines and bending over.  This seems to be a chronic problem for years along with SOB -coronary CTA 08/2021 showed a coronary calcium score of 153 with nonobstructive calcified plaque of 25 to 49% in the proximal LAD and noncalcified 25 to 49% plaque in the distal LAD >> this has not been felt to be the etiology of shortness of and chronic chest pain -Stress PET CT 04/2023 showed no ischemia and normal blood flow reserve with normal LVF -continue prescription drug management with ASA 81mg  daily, Crestor 10mg  daily and Carvedilol 12.5mg  BID with PRN refills    5.  Hyperlipidemia -LDL goal <70 -check FLP and ALT -continue prescription drug management with Crestor 10mg  daily with PRN refills  6.  Pulmonic insufficiency -moderate by echo 04/2023 -repeat echo 04/2024  Followup with me in 1 year   Medication Adjustments/Labs and Tests Ordered: Current medicines are reviewed at length with the patient today.  Concerns regarding medicines are outlined above.  Medication changes, Labs and Tests ordered today are listed in the Patient Instructions below.  There are no Patient Instructions on file for this visit.   Signed, Armanda Magic, MD  07/26/2023 11:27 AM    [facility] [address] [place], [place], [place]  [address] Phone: [phone]; Fax:  [phone]

## 2023-07-26 NOTE — Patient Instructions (Signed)
 Medication Instructions:   *If you need a refill on your cardiac medications before your next appointment, please call your pharmacy*  Lab Work: Lipid and ALT fasting  If you have labs (blood work) drawn today and your tests are completely normal, you will receive your results only by: MyChart Message (if you have MyChart) OR A paper copy in the mail If you have any lab test that is abnormal or we need to change your treatment, we will call you to review the results.  Testing/Procedures: in Jan 2026 Your physician has requested that you have an echocardiogram. Echocardiography is a painless test that uses sound waves to create images of your heart. It provides your doctor with information about the size and shape of your heart and how well your heart's chambers and valves are working. This procedure takes approximately one hour. There are no restrictions for this procedure. Please do NOT wear cologne, perfume, aftershave, or lotions (deodorant is allowed). Please arrive 15 minutes prior to your appointment time.  Please note: We ask at that you not bring children with you during ultrasound (echo/ vascular) testing. Due to room size and safety concerns, children are not allowed in the ultrasound rooms during exams. Our front office staff cannot provide observation of children in our lobby area while testing is being conducted. An adult accompanying a patient to their appointment will only be allowed in the ultrasound room at the discretion of the ultrasound technician under special circumstances. We apologize for any inconvenience.   Follow-Up: At [facility], you and your health needs are our priority.  As part of our continuing mission to provide you with exceptional heart care, our providers are all part of one team.  This team includes your primary Cardiologist (physician) and Advanced Practice Providers or APPs (Physician Assistants and Nurse Practitioners) who all work together to  provide you with the care you need, when you need it.  Your next appointment: one year   We recommend signing up for the patient portal called "MyChart".  Sign up information is provided on this After Visit Summary.  MyChart is used to connect with patients for Virtual Visits (Telemedicine).  Patients are able to view lab/test results, encounter notes, upcoming appointments, etc.  Non-urgent messages can be sent to your provider as well.   To learn more about what you can do with MyChart, go to [email or web address].   Other Instructions       1st Floor: - Lobby - Registration  - Pharmacy  - Lab - Cafe  2nd Floor: - PV Lab - Diagnostic Testing (echo, CT, nuclear med)  3rd Floor: - Vacant  4th Floor: - TCTS (cardiothoracic surgery) - AFib Clinic - Structural Heart Clinic - Vascular Surgery  - Vascular Ultrasound  5th Floor: - HeartCare Cardiology (general and EP) - Clinical Pharmacy for coumadin, hypertension, lipid, weight-loss medications, and med management appointments    Valet parking services will be available as well.

## 2023-07-26 NOTE — Addendum Note (Signed)
 Addended by: Bertram Millard on: 07/26/2023 11:42 AM   Modules accepted: Orders

## 2023-07-31 ENCOUNTER — Other Ambulatory Visit: Payer: Self-pay

## 2023-07-31 MED ORDER — TELMISARTAN 40 MG PO TABS: 40.0000 mg | ORAL_TABLET | Freq: Two times a day (BID) | ORAL | 3 refills | Status: AC

## 2023-08-01 DIAGNOSIS — I340 Nonrheumatic mitral (valve) insufficiency: Secondary | ICD-10-CM | POA: Diagnosis not present

## 2023-08-01 DIAGNOSIS — N1832 Chronic kidney disease, stage 3b: Secondary | ICD-10-CM | POA: Diagnosis not present

## 2023-08-01 DIAGNOSIS — I10 Essential (primary) hypertension: Secondary | ICD-10-CM | POA: Diagnosis not present

## 2023-08-01 DIAGNOSIS — I351 Nonrheumatic aortic (valve) insufficiency: Secondary | ICD-10-CM | POA: Diagnosis not present

## 2023-08-01 DIAGNOSIS — R0609 Other forms of dyspnea: Secondary | ICD-10-CM | POA: Diagnosis not present

## 2023-08-01 DIAGNOSIS — I371 Nonrheumatic pulmonary valve insufficiency: Secondary | ICD-10-CM | POA: Diagnosis not present

## 2023-08-01 DIAGNOSIS — E7800 Pure hypercholesterolemia, unspecified: Secondary | ICD-10-CM | POA: Diagnosis not present

## 2023-08-01 DIAGNOSIS — I2510 Atherosclerotic heart disease of native coronary artery without angina pectoris: Secondary | ICD-10-CM | POA: Diagnosis not present

## 2023-08-01 LAB — LIPID PANEL
Chol/HDL Ratio: 2.2 ratio (ref 0.0–4.4)
Cholesterol, Total: 106 mg/dL (ref 100–199)
HDL: 48 mg/dL (ref >39)
LDL Chol Calc (NIH): 42 mg/dL (ref 0–99)
Triglycerides: 76 mg/dL (ref 0–149)
VLDL Cholesterol Cal: 16 mg/dL (ref 5–40)

## 2023-08-01 LAB — ALT: ALT: 17 IU/L (ref 0–32)

## 2023-08-06 DIAGNOSIS — N8110 Cystocele, unspecified: Secondary | ICD-10-CM | POA: Diagnosis not present

## 2023-08-06 DIAGNOSIS — R102 Pelvic and perineal pain: Secondary | ICD-10-CM | POA: Diagnosis not present

## 2023-08-07 DIAGNOSIS — N8110 Cystocele, unspecified: Secondary | ICD-10-CM | POA: Diagnosis not present

## 2023-08-08 DIAGNOSIS — N1832 Chronic kidney disease, stage 3b: Secondary | ICD-10-CM | POA: Diagnosis not present

## 2023-08-08 DIAGNOSIS — I129 Hypertensive chronic kidney disease with stage 1 through stage 4 chronic kidney disease, or unspecified chronic kidney disease: Secondary | ICD-10-CM | POA: Diagnosis not present

## 2023-08-08 DIAGNOSIS — D631 Anemia in chronic kidney disease: Secondary | ICD-10-CM | POA: Diagnosis not present

## 2023-08-08 DIAGNOSIS — N2581 Secondary hyperparathyroidism of renal origin: Secondary | ICD-10-CM | POA: Diagnosis not present

## 2023-08-10 DIAGNOSIS — N340 Urethral abscess: Secondary | ICD-10-CM | POA: Diagnosis not present

## 2023-08-10 DIAGNOSIS — N8111 Cystocele, midline: Secondary | ICD-10-CM | POA: Diagnosis not present

## 2023-08-10 DIAGNOSIS — K623 Rectal prolapse: Secondary | ICD-10-CM | POA: Diagnosis not present

## 2023-08-10 DIAGNOSIS — N939 Abnormal uterine and vaginal bleeding, unspecified: Secondary | ICD-10-CM | POA: Diagnosis not present

## 2023-08-16 DIAGNOSIS — N814 Uterovaginal prolapse, unspecified: Secondary | ICD-10-CM | POA: Diagnosis not present

## 2023-08-16 DIAGNOSIS — N340 Urethral abscess: Secondary | ICD-10-CM | POA: Diagnosis not present

## 2023-08-16 DIAGNOSIS — N950 Postmenopausal bleeding: Secondary | ICD-10-CM | POA: Diagnosis not present

## 2023-08-16 DIAGNOSIS — N952 Postmenopausal atrophic vaginitis: Secondary | ICD-10-CM | POA: Diagnosis not present

## 2023-08-16 DIAGNOSIS — N816 Rectocele: Secondary | ICD-10-CM | POA: Diagnosis not present

## 2023-08-19 ENCOUNTER — Other Ambulatory Visit: Payer: Self-pay | Admitting: Nurse Practitioner

## 2023-08-22 DIAGNOSIS — I10 Essential (primary) hypertension: Secondary | ICD-10-CM | POA: Diagnosis not present

## 2023-08-22 DIAGNOSIS — Z79899 Other long term (current) drug therapy: Secondary | ICD-10-CM | POA: Diagnosis not present

## 2023-08-22 DIAGNOSIS — F331 Major depressive disorder, recurrent, moderate: Secondary | ICD-10-CM | POA: Diagnosis not present

## 2023-08-22 DIAGNOSIS — E039 Hypothyroidism, unspecified: Secondary | ICD-10-CM | POA: Diagnosis not present

## 2023-08-22 DIAGNOSIS — Z0000 Encounter for general adult medical examination without abnormal findings: Secondary | ICD-10-CM | POA: Diagnosis not present

## 2023-08-22 DIAGNOSIS — N819 Female genital prolapse, unspecified: Secondary | ICD-10-CM | POA: Diagnosis not present

## 2023-08-22 DIAGNOSIS — I517 Cardiomegaly: Secondary | ICD-10-CM | POA: Diagnosis not present

## 2023-08-22 DIAGNOSIS — N183 Chronic kidney disease, stage 3 unspecified: Secondary | ICD-10-CM | POA: Diagnosis not present

## 2023-09-04 ENCOUNTER — Ambulatory Visit: Attending: Internal Medicine | Admitting: Internal Medicine

## 2023-09-04 ENCOUNTER — Encounter: Payer: Self-pay | Admitting: Internal Medicine

## 2023-09-04 ENCOUNTER — Telehealth: Payer: Self-pay | Admitting: Pharmacist

## 2023-09-04 ENCOUNTER — Ambulatory Visit: Attending: Genetic Counselor | Admitting: Genetic Counselor

## 2023-09-04 VITALS — BP 130/62 | HR 69 | Ht 67.0 in | Wt 153.6 lb

## 2023-09-04 DIAGNOSIS — I421 Obstructive hypertrophic cardiomyopathy: Secondary | ICD-10-CM | POA: Diagnosis not present

## 2023-09-04 DIAGNOSIS — I2510 Atherosclerotic heart disease of native coronary artery without angina pectoris: Secondary | ICD-10-CM | POA: Diagnosis not present

## 2023-09-04 DIAGNOSIS — I371 Nonrheumatic pulmonary valve insufficiency: Secondary | ICD-10-CM

## 2023-09-04 DIAGNOSIS — I351 Nonrheumatic aortic (valve) insufficiency: Secondary | ICD-10-CM

## 2023-09-04 MED ORDER — MAVACAMTEN 5 MG PO CAPS: 5.0000 mg | ORAL_CAPSULE | Freq: Every day | ORAL | 0 refills | Status: DC

## 2023-09-04 NOTE — Progress Notes (Signed)
 Cardiology Office Note:  .    Date:  09/04/2023  ID:  Brooke [name], DOB 03-Sep-1934, MRN [id number] PCP: Olin Bertin, MD  [facility] Providers Cardiologist:  Gaylyn Keas, MD     CC: [facility] Consulted for the evaluation of oHCM at the behest of Dr. Micael Adas  History of Present Illness: .    Brooke [name] is a 89 y.o. female with obstructive hypertrophic cardiomyopathy who presents with shortness of breath, chest pain, and dizziness.  She experiences shortness of breath, chest pain, and neck pain during physical activities such as gardening, walking fast, or walking uphill. These symptoms limit her activities and cause concern about her health.  She has a history of nonobstructive coronary artery disease with a calcium  score of 153. A CT scan previously showed mild nonobstructive coronary artery disease without significant blockage. A squat stand echocardiogram showed a septal thickness of 17 mmHg and LVOT flow acceleration without a dynamic LVOT gradient. A stress echocardiogram was performed on June 27, 2023, and a cardiac MRI was conducted in 2023- LVOT gradient was noted but did not meet 15 mm criteria.  She experiences dizziness, particularly when standing up, and sometimes feels lightheaded, especially when transitioning from bright outdoor light to indoors. Dizziness is not associated with eating large meals or hot weather and is exacerbated by inadequate hydration.  Her current medications include a baby aspirin and carvedilol  15 mg twice a day, which effectively control her blood pressure and maintain her heart rate in the sixties.  Her father had a heart attack, and her brother has unspecified heart problems. She has four children and several grandchildren, none of whom are known to have heart problems. There was a question of hypertrophic nonobstructive cardiomyopathy in 2023, leading to a cardiac MRI in 2013. No family members have been tested for hypertrophic  cardiomyopathy.  Discussed the use of AI scribe software for clinical note transcription with the patient, who gave verbal consent to proceed.  Relevant histories: .  Social  - She is the primary caregiver for her husband, who has dementia, and is concerned about her ability to continue in this role due to her symptoms - has four children, one of whom is non-amenable to screening ROS: As per HPI.   Studies Reviewed: .     Cardiac Studies & Procedures   ______________________________________________________________________________________________   STRESS TESTS  ECHOCARDIOGRAM STRESS TEST 06/27/2023  Narrative EXERCISE STRESS ECHO REPORT   --------------------------------------------------------------------------------  Patient Name:   Brooke [name] Date of Exam: 06/27/2023 Medical Rec #:  [id number]       Height:       67.0 in Accession #:    [id number]      Weight:       152.0 lb Date of Birth:  1934-12-26       BSA:          1.800 m Patient Age:    88 years        BP:           131/52 mmHg Patient Gender: F               HR:           69 bpm. Exam Location:  [address]  Procedure: 2D Echo, Limited Color Doppler, Color Doppler and Stress Echo  Indications:     HOCM evaluation-squat to stand protocol Dyspnea on exertion R06.00  History:         Patient has prior history  of Echocardiogram examinations, most recent 05/24/2023. Previous echo revealed LVEF 75% severe LVH.  Sonographer:     Donnita Gales [facility], RDCS Referring Phys:  [id number] [facility] Miki Alert Diagnosing Phys: Grady Lawman MD  IMPRESSIONS   1. Squat to Stand protocol (Mayo protocol). Dynamic maneuvers performed to elicit LVOT/intracavitary gradient. 2. The findings of the study demonstrate that with dynamic maneuvers, a dynamic outflow tract obstruction is present (peak LVOT gradient 67 mmHG). 3. At rest, intracavitary interrogation demonstrates max instantaneous gradient of 16 mmHg, and with  Valsalva intracavitary gradient is 49 mmHg. With Valsalva, the LVOT max instantaneous gradient is 67 mmHg. The level of obstruction for maximum gradient is likely the basal-mid septum. Repetitive squat to stand and treadmill exercise failed to demonstrate a dyanmic late peaking signal, and are indeterminate for obstructive physiology. 4. Left atrial size is mildly dilated. Left atrial reservoir strain is reduced at 20.6%. 5. This is an inconclusive stress echocardiogram for ischemia. Non-diagnostic study for ischemia due to protocol. Patient had below average exercise capacity limited by shortness of breath, exercising for 2 minutes and 5 seconds acheiving 76% predicted HR. 6. Baseline echo findings: EF 70-75%, concentric LVH with 15 mm wall thickness, mild to mild-moderate AI incompletely interrogated, degenerative mitral valve with no definite systolic anterior motion of the mitral valve, mild MR that does not significantly increase with dynamic maneuvers.  FINDINGS  Exam Protocol: The patient exercised on a treadmill according to a Bruce protocol. Squat to stand protocol.   Patient Performance: The patient exercised for 2 minutes and 05 seconds, achieving 4.6 METS. The maximum stage achieved was I of the Bruce protocol. The baseline heart rate was 66 bpm. The heart rate at peak stress was 92 bpm. The target heart rate was calculated to be 113 bpm. The percentage of maximum predicted heart rate achieved was 68.9 %. The baseline blood pressure was 131/52 mmHg. The blood pressure at peak stress was 162/67 mmHg. The blood pressure response was normal. The patient developed shortness of breath during the stress exam. The symptoms resolved with rest. The patient's functional capacity was below average.  EKG: Resting EKG showed normal sinus rhythm with no abnormal findings. The patient developed no abnormal EKG findings during exercise.   2D Echo Findings: The baseline ejection fraction was 70-75%.  The peak ejection fraction at stress was 75%. Baseline regional wall motion abnormalities were not present. This is an inconclusive stress echocardiogram for ischemia. This is a non-diagnostic study for ischemia due to protocol.  Stress Doppler:  LVOT: The baseline LVOT gradient was 16 mmHG. The peak LVOT gradient at stress was 67 mmHG. The findings of the study demonstrate that a stress-induced outflow tract obstruction is present.  Mitral Regurgitation: At baseline, mild mitral regurgitation was present.   Grady Lawman MD Electronically signed on 06/27/2023 at 5:46:22 PM      Final   ECHOCARDIOGRAM  ECHOCARDIOGRAM COMPLETE 05/24/2023  Narrative ECHOCARDIOGRAM REPORT    Patient Name:   TIMIKA MUENCH Date of Exam: 05/24/2023 Medical Rec #:  [id number]       Height:       67.0 in Accession #:    [id number]      Weight:       152.0 lb Date of Birth:  Jun 01, 1934       BSA:          1.800 m Patient Age:    88 years        BP:  120/50 mmHg Patient Gender: F               HR:           64 bpm. Exam Location:  [address]  Procedure: 2D Echo, Cardiac Doppler and Color Doppler  Indications:    I51.7 Cardiomegaly  History:        Patient has prior history of Echocardiogram examinations, most recent 08/25/2020. CAD, Signs/Symptoms:Chest Pain; Risk Factors:Hypertension.  Sonographer:    Cheri Coria Rodgers-Jones RDCS Referring Phys: [id number] TRACI R TURNER  IMPRESSIONS   1. LVOT Flow acceleration without systolic anterior motion of the mitral valve; basal septal hypertrophy with thickness 17 mm in short axis views, dynamic gradient not assessed with Valsalva . Left ventricular ejection fraction, by estimation, is 70 to 75%. The left ventricle has hyperdynamic function. The left ventricle has no regional wall motion abnormalities. There is severe asymmetric left ventricular hypertrophy of the basal-septal segment. Left ventricular diastolic parameters are  indeterminate. Elevated left atrial pressure. 2. Right ventricular systolic function is normal. The right ventricular size is normal. Mildly increased right ventricular wall thickness. 3. The mitral valve is normal in structure. Mild mitral valve regurgitation. 4. The aortic valve is tricuspid. Aortic valve regurgitation is mild. No aortic stenosis is present. Aortic regurgitation PHT measures 520 msec. 5. Pulmonic valve regurgitation is moderate. 6. The inferior vena cava is normal in size with greater than 50% respiratory variability, suggesting right atrial pressure of 3 mmHg.  Comparison(s): Prior images reviewed side by side. Pulmonic insufficiency has increased.  FINDINGS Left Ventricle: LVOT Flow acceleration without systolic anterior motion of the mitral valve; basal septal hypertrophy with thickness 17 mm in short axis views, dynamic gradient not assessed with Valsalva. Left ventricular ejection fraction, by estimation, is 70 to 75%. The left ventricle has hyperdynamic function. The left ventricle has no regional wall motion abnormalities. The left ventricular internal cavity size was normal in size. There is severe asymmetric left ventricular hypertrophy of the basal-septal segment. Left ventricular diastolic parameters are indeterminate. Elevated left atrial pressure.  Right Ventricle: The right ventricular size is normal. Mildly increased right ventricular wall thickness. Right ventricular systolic function is normal.  Left Atrium: Left atrial size was normal in size.  Right Atrium: Right atrial size was normal in size.  Pericardium: Trivial pericardial effusion is present. The pericardial effusion is anterior to the right ventricle.  Mitral Valve: The mitral valve is normal in structure. Mild mitral valve regurgitation.  Tricuspid Valve: The tricuspid valve is normal in structure. Tricuspid valve regurgitation is not demonstrated.  2D Vena Contracta 2.4 mm. The aortic valve is  tricuspid. Aortic valve regurgitation is mild. No aortic stenosis is present. Pulmonic Valve: The pulmonic valve was normal in structure. Pulmonic valve regurgitation is moderate. No evidence of pulmonic stenosis.  Aorta: The aortic root and ascending aorta are structurally normal, with no evidence of dilitation.  Venous: The inferior vena cava is normal in size with greater than 50% respiratory variability, suggesting right atrial pressure of 3 mmHg.  IAS/Shunts: The atrial septum is grossly normal.   LEFT VENTRICLE PLAX 2D LVIDd:         4.30 cm   Diastology LVIDs:         2.20 cm   LV e' medial:    5.44 cm/s LV PW:         1.00 cm   LV E/e' medial:  16.3 LV IVS:        0.80 cm  LV e' lateral:   4.57 cm/s LVOT diam:     1.70 cm   LV E/e' lateral: 19.4 LV SV:         72 LV SV Index:   40 LVOT Area:     2.27 cm   RIGHT VENTRICLE             IVC RV Basal diam:  3.50 cm     IVC diam: 1.50 cm RV S prime:     14.75 cm/s TAPSE (M-mode): 2.8 cm  LEFT ATRIUM             Index        RIGHT ATRIUM           Index LA diam:        4.00 cm 2.22 cm/m   RA Area:     11.60 cm LA Vol (A2C):   59.3 ml 32.95 ml/m  RA Volume:   27.80 ml  15.45 ml/m LA Vol (A4C):   58.1 ml 32.28 ml/m LA Biplane Vol: 59.3 ml 32.95 ml/m AORTIC VALVE AV Area (Vmax):    2.02 cm AV Area (Vmean):   1.90 cm AV Area (VTI):     1.73 cm AV Vmax:           181.33 cm/s AV Vmean:          126.667 cm/s AV VTI:            0.415 m AV Peak Grad:      13.2 mmHg AV Mean Grad:      7.7 mmHg LVOT Vmax:         161.00 cm/s LVOT Vmean:        106.000 cm/s LVOT VTI:          0.317 m LVOT/AV VTI ratio: 0.76 AI PHT:            520 msec  AORTA Ao Root diam: 3.00 cm Ao Asc diam:  3.00 cm  MITRAL VALVE MV Area (PHT): 3.31 cm     SHUNTS MV Decel Time: 229 msec     Systemic VTI:  0.32 m MV E velocity: 88.85 cm/s   Systemic Diam: 1.70 cm MV A velocity: 122.00 cm/s MV E/A ratio:  0.73  Gloriann Larger  MD Electronically signed by Gloriann Larger MD Signature Date/Time: 05/24/2023/2:54:20 PM    Final      CT SCANS  CT CORONARY MORPH W/CTA COR W/SCORE 08/16/2020  Addendum 08/16/2020  1:44 PM ADDENDUM REPORT: 08/16/2020 13:42  CLINICAL DATA:  Chest pain  EXAM: Cardiac CTA  MEDICATIONS: Sub lingual nitro. 4 mg and lopressor  100mg   TECHNIQUE: The patient was scanned on a [employer] 192 scanner. Gantry rotation speed was 250 msecs. Collimation was. 6 mm . A 120 kV prospective scan was triggered in the ascending thoracic aorta at 140 HU's with full mA between 30-70% of the R-R interval . Average HR during the scan was 63 bpm. The 3D data set was interpreted on a dedicated work station using MPR, MIP and VRT modes. A total of 80 cc of contrast was used.  FINDINGS: Non-cardiac: See separate report from [facility]. No significant findings on limited lung and soft tissue windows.  Calcium  score: 153 isolated to proximal LAD  Coronary Arteries: Right dominant with no anomalies  LM: Normal  LAD: Tortuous vessel, 25-49% calcified plaque in proximal vessel there is some diffuse narrowing 25-49% with soft plaque in the distal vessel after D2 and before D3  D1: Normal  D2: Normal  D3: Normal  Circumflex: Tortuous vessels normal  OM1: Normal  OM2: Normal  RCA: Normal  PDA: Normal  PLA: Normal  IMPRESSION: 1. Calcium  score 153 which is 51 st percentile for age and sex  2.  CAD RADS 2 non obstructive CAD in LAD see description above  3.  Normal aortic root 3.3 cm  Janelle Mediate   Electronically Signed By: Janelle Mediate M.D. On: 08/16/2020 13:42  Narrative EXAM: OVER-READ INTERPRETATION  CT CHEST  The following report is an over-read performed by radiologist Dr. Alexandria Angel of [facility] on 08/16/2020. This over-read does not include interpretation of cardiac or coronary anatomy or pathology. The coronary calcium   score/coronary CTA interpretation by the cardiologist is attached.  COMPARISON:  None.  FINDINGS: Atherosclerotic calcifications in the thoracic aorta. In the left upper lobe (axial image 14 of series 10 and coronal image 39 of series 602) there is a irregular-shaped 1.7 x 0.8 x 0.8 cm nodule with slightly indistinct margins. Within the visualized portions of the thorax there is no acute consolidative airspace disease, no pleural effusions, no pneumothorax and no lymphadenopathy. Visualized portions of the upper abdomen are unremarkable. There are no aggressive appearing lytic or blastic lesions noted in the visualized portions of the skeleton.  IMPRESSION: 1. 1.7 x 0.8 x 0.8 cm left upper lobe nodule with slightly indistinct margins. This may simply be infectious or inflammatory in etiology, however, underlying neoplasm is not excluded. Short-term follow-up noncontrast chest CT is recommended in 1-2 months to ensure the stability or regression of this finding. 2.  Aortic Atherosclerosis (ICD10-I70.0).  Electronically Signed: By: Alexandria Angel M.D. On: 08/16/2020 13:01   CARDIAC MRI  MR CARDIAC MORPHOLOGY W WO CONTRAST 11/16/2021  Narrative CLINICAL DATA:  Clinical question of infiltrative cardiomyopathy Study assumes BSA of 1.78 m2.  EXAM: CARDIAC MRI  TECHNIQUE: The patient was scanned on a 1.5 Tesla GE magnet. A dedicated cardiac coil was used. Functional imaging was done using Fiesta sequences. 2,3, and 4 chamber views were done to assess for RWMA's. Modified Simpson's rule using a short axis stack was used to calculate an ejection fraction on a dedicated work [employer]. The patient received 10 cc of Gadavist . After 10 minutes inversion recovery sequences were used to assess for infiltration and scar tissue.  CONTRAST:  10 cc  of Gadavist   FINDINGS: 1. Normal left ventricular size, with LVEDD 39 mm, and LVEDVi 46 mL/m2.  Mild concentric  remodeling, with intraventricular septal thickness of 11 mm, posterior wall thickness of 11 mm, and myocardial mass index of 48 g/m2.  Normal left ventricular systolic function (LVEF =65%). There are no regional wall motion abnormalities, in the three chamber view there is evidence of systolic anterior motion of the mitral valve and LVOT obstruction.  Left ventricular parametric mapping notable for normal native T1 and T2.  There is no late gadolinium enhancement in the left ventricular myocardium.  2. Normal right ventricular size with RVEDVI 55 mL/m2.  Normal right ventricular thickness.  Normal right ventricular systolic function (RVEF =59%). There are no regional wall motion abnormalities or aneurysms.  There is notable fat overlying the right ventricular pericardium without fatty infiltration.  3.  Normal left and right atrial size.  4.  Normal size of the aortic root, ascending aorta.  Mild pulmonary artery dilation 29 mm.  5. Valve assessment:  Aortic Valve: Morphology not well visualized. Mild aortic regurgitation, regurgitant fraction 17%.  Pulmonic Valve: No significant pulmonary insufficiency, regurgitant fraction 1.4%.  Tricuspid Valve: Mild tricuspid regurgitation.  Mitral Valve: Mild mitral regurgitation, regurgitant fraction 8%.  6.  Normal pericardium.  No pericardial effusion.  7. Grossly, no extracardiac findings. Recommended dedicated study if concerned for non-cardiac pathology.  IMPRESSION: Normal LVEF  Normal parametric mapping and no LGE; not consistent with infiltrative LGE.  Evidence of LVOT obstruction in three chamber view without evidence of significant LV hypertrophy.  Mild Aortic regurgitation.  Gloriann Larger MD   Electronically Signed By: Gloriann Larger M.D. On: 11/16/2021 21:25   ______________________________________________________________________________________________      Physical Exam:    VS:   BP 130/62   Pulse 69   Ht 5\' 7"  (1.702 m)   Wt 153 lb 9.6 oz (69.7 kg)   SpO2 97%   BMI 24.06 kg/m    Wt Readings from Last 3 Encounters:  09/04/23 153 lb 9.6 oz (69.7 kg)  07/26/23 151 lb 12.8 oz (68.9 kg)  05/17/23 152 lb (68.9 kg)    Gen: no distress, elderly female   Neck: No JVD Cardiac: No Rubs or Gallops, harsh systolic standing Murmur, RRR +2 radial pulses Respiratory: Clear to auscultation bilaterally, normal effort, normal  respiratory rate GI: Soft, nontender, non-distended  MS: No  edema;  moves all extremities Integument: Skin feels warm Neuro:  At time of evaluation, alert and oriented to person/place/time/situation  Psych: Normal affect, patient feels ok   ASSESSMENT AND PLAN: .    Obstructive hypertrophic cardiomyopathy - NYHA II, Septal thickness 17 mm Symptomatic obstructive hypertrophic cardiomyopathy with severe LVOT gradient of 67 mmHg on stress echocardiogram. Symptoms include dyspnea, angina, neck pain, and lightheadedness, particularly during exertion. Treatment options discussed: cardiac myosin inhibitors, alcohol septal ablation, and myectomy. Cardiac myosin inhibitor chosen for its reversibility and potential to alleviate symptoms without invasive procedures. Risks include potential congestive heart failure symptoms if muscle relaxation is excessive. Alcohol septal ablation offers a permanent solution but carries a 5-10% risk of requiring a pacemaker and procedural risks. Myectomy not recommended due to her age and invasiveness. - Initiate cardiac myosin inhibitor therapy on June 9th. - Perform echocardiograms at 4, 8, and 12 weeks post-initiation to monitor response. - Reassess symptoms and LVOT gradient after therapy initiation. - Consider alcohol septal ablation if symptoms persist despite medical therapy.  Continue coreg  for now  Nonobstructive coronary artery disease Nonobstructive coronary artery disease with calcium  score of 153. No significant  blockages identified to account for current symptoms. Condition is not the primary focus of current treatment plan.  Moderate pulmonic insufficiency Moderate pulmonic insufficiency identified on echocardiogram. Not the primary focus of treatment but will be reassessed if symptoms persist after addressing LVOT obstruction. - Reassess pulmonic valve function if symptoms persist after treatment of LVOT obstruction.  Mild to moderate aortic regurgitation Mild to moderate aortic regurgitation identified on echocardiogram. Not the primary focus of treatment but will be reassessed if symptoms persist after addressing LVOT obstruction. - Reassess aortic valve function if symptoms persist after treatment of LVOT obstruction.  Mild primary mitral regurgitation Mild primary mitral regurgitation identified. Not contributing significantly to symptoms and not the primary focus of treatment.  Hypertension Hypertension well-controlled on current regimen including carvedilol . Heart rate is well-managed in the 60s.  Goals of Care She is the primary caregiver for her husband with dementia and prefers less invasive treatment options to maintain her caregiving ability. Emphasized managing symptoms to support her caregiving role. She is concerned about the  potential need for increased physical activity due to caregiving responsibilities and wishes to avoid drastic interventions.  Follow-up Follow-up plan includes monitoring response to cardiac myosin inhibitor therapy and reassessing treatment efficacy. - Schedule follow-up appointments at 4, 8, and 12 weeks post-initiation of cardiac myosin inhibitor therapy. - Plan for follow-up visits in approximately 3 months and another in 6 months to assess treatment progress.  Gloriann Larger, MD FASE [facility] Cardiologist [facility]  [address] [place], [address] [place], [place] [address] [phone]  3:00 PM

## 2023-09-04 NOTE — Telephone Encounter (Signed)
 PA for camzyos submitted Key: [id number] start form faxed

## 2023-09-04 NOTE — Patient Instructions (Signed)
 Medication Instructions:  START: mavacamten (Camzyos) 5 mg by mouth once daily.  Your projected start date is September 24, 2023 .  This date may change.  Please do not start medication prior to this date unless advise to do so by our office.    *If you need a refill on your cardiac medications before your next appointment, please call your pharmacy*  Lab Work: NONE  If you have labs (blood work) drawn today and your tests are completely normal, you will receive your results only by: MyChart Message (if you have MyChart) OR A paper copy in the mail If you have any lab test that is abnormal or we need to change your treatment, we will call you to review the results.  Testing/Procedures: You will need an Echocardiogram 4, 8 and 12 weeks after starting mavacamten (Camzyos). Echo #1- Due July 2-7 Echo #2- Due July 30- Aug 4 Echo #3 - Due Aug 26-Sept 1  Your physician has requested that you have an echocardiogram. Echocardiography is a painless test that uses sound waves to create images of your heart. It provides your doctor with information about the size and shape of your heart and how well your heart's chambers and valves are working. This procedure takes approximately one hour. There are no restrictions for this procedure. Please do NOT wear cologne, perfume, aftershave, or lotions (deodorant is allowed). Please arrive 15 minutes prior to your appointment time.  Please note: We ask at that you not bring children with you during ultrasound (echo/ vascular) testing. Due to room size and safety concerns, children are not allowed in the ultrasound rooms during exams. Our front office staff cannot provide observation of children in our lobby area while testing is being conducted. An adult accompanying a patient to their appointment will only be allowed in the ultrasound room at the discretion of the ultrasound technician under special circumstances. We apologize for any inconvenience.   Follow-Up: At  [facility], you and your health needs are our priority.  As part of our continuing mission to provide you with exceptional heart care, our providers are all part of one team.  This team includes your primary Cardiologist (physician) and Advanced Practice Providers or APPs (Physician Assistants and Nurse Practitioners) who all work together to provide you with the care you need, when you need it.  Your next appointment:   3 month(s)  Provider:   Gloriann Larger, MD

## 2023-09-05 ENCOUNTER — Telehealth: Payer: Self-pay | Admitting: Internal Medicine

## 2023-09-05 NOTE — Telephone Encounter (Signed)
 Patient would like a rough estimate on how much her echo's are going to cost as well as her medication for HOCM. Can someone reach out to her once Prior Auth's are approved to discuss? Thank you!

## 2023-09-06 NOTE — Telephone Encounter (Signed)
 The patient had an echo in February 2025 w/a $150 copay (to the hospital). I'll let her know that copay will probably be the same for upcoming echos.  She will need information from provider team regarding medication. We are unable to provide cost for medications.

## 2023-09-07 NOTE — Telephone Encounter (Signed)
 PA for Camzyos approved through 04/16/24

## 2023-09-11 ENCOUNTER — Other Ambulatory Visit (HOSPITAL_COMMUNITY): Payer: Self-pay

## 2023-09-11 ENCOUNTER — Telehealth: Payer: Self-pay | Admitting: Pharmacy Technician

## 2023-09-11 ENCOUNTER — Telehealth: Payer: Self-pay | Admitting: Internal Medicine

## 2023-09-11 NOTE — Telephone Encounter (Signed)
 Patient calling to update medication list. States that she uses Estradiol vaginal cream once every night.  Over the counter medication Lactaid tablets for diary  Claritin ready tabs 10mg  as needed  Beano 2 tablets ex strength as needed for gas Thumbs 1000mg  as needed for heartburn Biotene spray as needed for dry mouth Align probiotic one per day GasX as needed for gas  Please advise

## 2023-09-11 NOTE — Telephone Encounter (Signed)
 Patient Advocate Encounter   The patient was approved for a [employer] grant that will help cover the cost of Camzyos Total amount awarded, 4500.  Effective: 08/12/23 - 08/10/24   ZOX:[id number] WUJ:[id number] Group:[id number] WG:[id number] [employer] ID: [id number]     Sent in other encounter to [facility]

## 2023-09-11 NOTE — Telephone Encounter (Signed)
 Updated patient's medication list with OTC medications she asked to add to her list.

## 2023-09-11 NOTE — Telephone Encounter (Signed)
 Called pt to let her know she was approved for [employer] grant. This info will be provided to specialty pharmacy once med is sent. It looks like her application was sent to patient assistance, possible for help with echo costs.  LVM for patient to call back.

## 2023-09-15 DIAGNOSIS — E039 Hypothyroidism, unspecified: Secondary | ICD-10-CM | POA: Diagnosis not present

## 2023-09-15 DIAGNOSIS — F330 Major depressive disorder, recurrent, mild: Secondary | ICD-10-CM | POA: Diagnosis not present

## 2023-09-15 DIAGNOSIS — I10 Essential (primary) hypertension: Secondary | ICD-10-CM | POA: Diagnosis not present

## 2023-09-15 DIAGNOSIS — E782 Mixed hyperlipidemia: Secondary | ICD-10-CM | POA: Diagnosis not present

## 2023-09-18 DIAGNOSIS — H6121 Impacted cerumen, right ear: Secondary | ICD-10-CM | POA: Diagnosis not present

## 2023-09-21 NOTE — Telephone Encounter (Signed)
 Made patient aware of echo cost of $225 per [employer]. I gave her the phone # to call [facility] to have them send out free trial [phone]  She wanted to let Dr. Tita Form know that she has had bladder and uterine/rectum prolapse.   She will let us  know if she doesn't get the medication by Monday (her expected start date)

## 2023-09-24 NOTE — Telephone Encounter (Signed)
 Patient made aware that her packet was triaged today and she should be able to call tomorrow AM to set up delivery.

## 2023-09-25 ENCOUNTER — Telehealth: Payer: Self-pay | Admitting: Internal Medicine

## 2023-09-25 NOTE — Telephone Encounter (Signed)
 Pt c/o medication issue:  1. Name of Medication:   mavacamten  (CAMZYOS ) 5 MG CAPS capsule   2. How are you currently taking this medication (dosage and times per day)?   3. Are you having a reaction (difficulty breathing--STAT)?   4. What is your medication issue?   Caller (Jeswin) wants a call back regarding this medication and possible interaction with other medication (telmisartan  (MICARDIS ) 40 MG tablet)  prescribed.

## 2023-09-25 NOTE — Telephone Encounter (Signed)
 Called Cover my Meds to f/u concern.   Spoke with [facility] pharmacist wants to know if MD is aware of interaction between Camzyos  and Telmisartan .  Advised will send to MD to review.

## 2023-09-26 NOTE — Telephone Encounter (Signed)
 Called Cover my meds advised of MD response: Telmisartan , could increase LVOT gradient as part of HCM physiology but there is no interaction I am aware of. While note showing secondary prevenitve remodeling, INHERIT trial of ARB showed safety in patients with HCM).   No further questions or concerns.

## 2023-09-27 ENCOUNTER — Telehealth: Payer: Self-pay | Admitting: Internal Medicine

## 2023-09-27 NOTE — Telephone Encounter (Signed)
 Refill not needed at this time. Will send once pt has had echocardiogram as ordered by MD and per Camzyos  REMS protocol.

## 2023-09-27 NOTE — Telephone Encounter (Signed)
*  STAT* If patient is at the pharmacy, call can be transferred to refill team.   1. Which medications need to be refilled? (please list name of each medication and dose if known)   mavacamten  (CAMZYOS ) 5 MG CAPS capsule    4. Which pharmacy/location (including street and city if local pharmacy) is medication to be sent to?    [facility] [place], [place] - [address] Phone: [phone]  Fax: [phone]       5. Do they need a 30 day or 90 day supply? 90

## 2023-09-28 NOTE — Telephone Encounter (Signed)
 Patient called to say that camzyos  has arrived. I advised that I will reach out to Dr. Tita Form for new start date. Her echo will need to be rescheduled.

## 2023-10-02 NOTE — Telephone Encounter (Signed)
 Called pt advised to start Camzyos  today and have 4 week echo on 10/25/23 at 8:30 am.  Pt is agreeable to this plan will contact the office with any questions and or concerns.

## 2023-10-15 DIAGNOSIS — F330 Major depressive disorder, recurrent, mild: Secondary | ICD-10-CM | POA: Diagnosis not present

## 2023-10-15 DIAGNOSIS — I10 Essential (primary) hypertension: Secondary | ICD-10-CM | POA: Diagnosis not present

## 2023-10-15 DIAGNOSIS — E782 Mixed hyperlipidemia: Secondary | ICD-10-CM | POA: Diagnosis not present

## 2023-10-15 DIAGNOSIS — E039 Hypothyroidism, unspecified: Secondary | ICD-10-CM | POA: Diagnosis not present

## 2023-10-17 ENCOUNTER — Other Ambulatory Visit (HOSPITAL_COMMUNITY)

## 2023-10-25 ENCOUNTER — Telehealth: Payer: Self-pay | Admitting: Pharmacist

## 2023-10-25 ENCOUNTER — Ambulatory Visit (HOSPITAL_COMMUNITY)
Admission: RE | Admit: 2023-10-25 | Discharge: 2023-10-25 | Disposition: A | Discharge status: Home / Self Care | Source: Ambulatory Visit | Attending: Internal Medicine | Admitting: Internal Medicine

## 2023-10-25 DIAGNOSIS — I421 Obstructive hypertrophic cardiomyopathy: Secondary | ICD-10-CM | POA: Diagnosis not present

## 2023-10-25 LAB — ECHOCARDIOGRAM COMPLETE
Area-P 1/2: 3.7 cm2
MV M vel: 4.43 m/s
MV Peak grad: 78.5 mmHg
P 1/2 time: 415 ms
S' Lateral: 2.3 cm

## 2023-10-25 NOTE — Telephone Encounter (Signed)
 Patient called my line, returning call from office. I advised that Dr. JAYSON did try to call her, but he is out of the office. He will try to call her again tomorrow or Sunday.

## 2023-10-29 ENCOUNTER — Ambulatory Visit: Payer: Self-pay

## 2023-10-29 MED ORDER — MAVACAMTEN 2.5 MG PO CAPS: 2.5000 mg | ORAL_CAPSULE | Freq: Every day | ORAL | 0 refills | Status: DC

## 2023-10-29 MED ORDER — CARVEDILOL 6.25 MG PO TABS
6.2500 mg | ORAL_TABLET | Freq: Two times a day (BID) | ORAL | 3 refills | Status: AC
Start: 2023-10-29 — End: ?

## 2023-10-29 NOTE — Telephone Encounter (Signed)
 [facility] updated on Camzyos  REMS portal. Left a message for pt to call back.  Would like to ensure pt is aware of medication changes and need to call specialty pharmacy to set up delivery of Camzyos .

## 2023-11-07 NOTE — Therapy (Incomplete)
 OUTPATIENT PHYSICAL THERAPY FEMALE PELVIC EVALUATION   Patient Name: Brooke [name] MRN: [id number] DOB:1935-02-24, 89 y.o., female Today's Date: 11/07/2023  END OF SESSION:   Past Medical History:  Diagnosis Date   Aortic insufficiency    mild by cMRI 11/2021   CAD (coronary artery disease), native coronary artery    coronary Ca score 153 with mild calcified plaque in the proximal LAD 25-49% by coronary CTA 08/2020   Chest pain    Chronic kidney disease    Diverticulitis    HTN (hypertension)    IBS (irritable bowel syndrome)    Osteopenia    Pulmonic regurgitation    moderate by echo 05/2023   Past Surgical History:  Procedure Laterality Date   CARDIAC CATHETERIZATION     CHOLECYSTECTOMY N/A 12/15/2021   Procedure: LAPAROSCOPIC CHOLECYSTECTOMY;  Surgeon: Aron Shoulders, MD;  Location: [facility] OR;  Service: General;  Laterality: N/A;   DILATION AND CURETTAGE OF UTERUS     Patient Active Problem List   Diagnosis Date Noted   HOCM (hypertrophic obstructive cardiomyopathy) (HCC) 09/04/2023   Pulmonic regurgitation    Chronic calculous cholecystitis 12/15/2021   CAD (coronary artery disease), native coronary artery    Hypertensive heart disease 06/08/2016   Aortic insufficiency 03/22/2015   Essential hypertension 03/22/2015   LVH (left ventricular hypertrophy) 03/22/2015   Abnormal PFT 09/17/2014   SOB (shortness of breath) 09/17/2014   DOE (dyspnea on exertion) 09/08/2014    PCP: Verena Mems, MD  REFERRING PROVIDER: Verena Mems, MD  REFERRING DIAG: N81.9 (ICD-10-CM) - Female genital prolapse, unspecified   THERAPY DIAG:  No diagnosis found.  Rationale for Evaluation and Treatment: Rehabilitation  ONSET DATE: ***  SUBJECTIVE:                                                                                                                                                                                           SUBJECTIVE STATEMENT: *** Fluid intake:    PAIN:  Are you having pain? {yes/no:20286} NPRS scale: ***/10 Pain location: {pelvic pain location:27098}  Pain type: {type:313116} Pain description: {PAIN DESCRIPTION:21022940}   Aggravating factors: *** Relieving factors: ***  PRECAUTIONS: {Therapy precautions:24002}  RED FLAGS: {PT Red Flags:29287}   WEIGHT BEARING RESTRICTIONS: {Yes ***/No:24003}  FALLS:  Has patient fallen in last 6 months? {fallsyesno:27318}  OCCUPATION: ***  ACTIVITY LEVEL : ***  PLOF: {PLOF:24004}  PATIENT GOALS: ***  PERTINENT HISTORY:  *** Sexual abuse: {Yes/No:304960894}  BOWEL MOVEMENT: Pain with bowel movement: {yes/no:20286} Type of bowel movement:{PT BM type:27100} Fully empty rectum: {No/Yes:304960894} Leakage: {Yes/No:304960894} Pads: {Yes/No:304960894} Fiber supplement/laxative {YES/NO AS:20300}  URINATION: Pain with urination: {yes/no:20286} Fully  empty bladder: {Yes/No:304960894}*** Stream: {PT urination:27102} Urgency: {YES/NO AS:20300} Frequency: *** Leakage: {PT leakage:27103} Pads: {Yes/No:304960894}  INTERCOURSE:  Ability to have vaginal penetration {YES/NO:21197} Pain with intercourse: {pain with intercourse PA:27099} Dryness{YES/NO AS:20300} Climax: *** Marinoff Scale: ***/3 Laxative:  PREGNANCY: Vaginal deliveries *** Tearing {Yes***/No:304960894} Episiotomy {YES/NO AS:20300} C-section deliveries *** Currently pregnant {Yes***/No:304960894}  PROLAPSE: {PT prolapse:27101}   OBJECTIVE:  Note: Objective measures were completed at Evaluation unless otherwise noted.  DIAGNOSTIC FINDINGS:  ***  PATIENT SURVEYS:  {rehab surveys:24030}  PFIQ-7: ***  COGNITION: Overall cognitive status: {cognition:24006}     SENSATION: Light touch: {intact/deficits:24005}  LUMBAR SPECIAL TESTS:  {lumbar special test:25242}  FUNCTIONAL TESTS:  {Functional tests:24029}  GAIT: Assistive device utilized: {Assistive devices:23999} Comments: ***  POSTURE:  {posture:25561}   LUMBARAROM/PROM:  A/PROM A/PROM  eval  Flexion   Extension   Right lateral flexion   Left lateral flexion   Right rotation   Left rotation    (Blank rows = not tested)  LOWER EXTREMITY ROM:  {AROM/PROM:27142} ROM Right eval Left eval  Hip flexion    Hip extension    Hip abduction    Hip adduction    Hip internal rotation    Hip external rotation    Knee flexion    Knee extension    Ankle dorsiflexion    Ankle plantarflexion    Ankle inversion    Ankle eversion     (Blank rows = not tested)  LOWER EXTREMITY MMT:  MMT Right eval Left eval  Hip flexion    Hip extension    Hip abduction    Hip adduction    Hip internal rotation    Hip external rotation    Knee flexion    Knee extension    Ankle dorsiflexion    Ankle plantarflexion    Ankle inversion    Ankle eversion     (Blank rows = not tested) PALPATION:   General: ***  Pelvic Alignment: ***  Abdominal: ***                External Perineal Exam: ***                             Internal Pelvic Floor: ***  Patient confirms identification and approves PT to assess internal pelvic floor and treatment {yes/no:20286}  PELVIC MMT:   MMT eval  Vaginal   Internal Anal Sphincter   External Anal Sphincter   Puborectalis   Diastasis Recti   (Blank rows = not tested)        TONE: ***  PROLAPSE: ***  TODAY'S TREATMENT:                                                                                                                              DATE: ***  EVAL ***   PATIENT EDUCATION:  Education details: *** Person educated: {Person educated:25204} Education method: {Education Method:25205} Education  comprehension: {Education Comprehension:25206}  HOME EXERCISE PROGRAM: ***  ASSESSMENT:  CLINICAL IMPRESSION: Patient is a *** y.o. *** who was seen today for physical therapy evaluation and treatment for ***.   OBJECTIVE IMPAIRMENTS: {opptimpairments:25111}.    ACTIVITY LIMITATIONS: {activitylimitations:27494}  PARTICIPATION LIMITATIONS: {participationrestrictions:25113}  PERSONAL FACTORS: {Personal factors:25162} are also affecting patient's functional outcome.   REHAB POTENTIAL: {rehabpotential:25112}  CLINICAL DECISION MAKING: {clinical decision making:25114}  EVALUATION COMPLEXITY: {Evaluation complexity:25115}   GOALS: Goals reviewed with patient? {yes/no:20286}  SHORT TERM GOALS: Target date: ***  *** Baseline: Goal status: INITIAL  2.  *** Baseline:  Goal status: INITIAL  3.  *** Baseline:  Goal status: INITIAL  4.  *** Baseline:  Goal status: INITIAL  5.  *** Baseline:  Goal status: INITIAL  6.  *** Baseline:  Goal status: INITIAL  LONG TERM GOALS: Target date: ***  *** Baseline:  Goal status: INITIAL  2.  *** Baseline:  Goal status: INITIAL  3.  *** Baseline:  Goal status: INITIAL  4.  *** Baseline:  Goal status: INITIAL  5.  *** Baseline:  Goal status: INITIAL  6.  *** Baseline:  Goal status: INITIAL  PLAN:  PT FREQUENCY: {rehab frequency:25116}  PT DURATION: {rehab duration:25117}  PLANNED INTERVENTIONS: {rehab planned interventions:25118::97110-Therapeutic exercises,97530- Therapeutic [phone]- Neuromuscular re-education,97535- Self Rjmz,02859- Manual therapy}  PLAN FOR NEXT SESSION: ***   Jamille Fisher, PT 11/07/2023, 6:23 PM

## 2023-11-08 ENCOUNTER — Ambulatory Visit: Attending: Family Medicine

## 2023-11-08 ENCOUNTER — Other Ambulatory Visit: Payer: Self-pay

## 2023-11-08 DIAGNOSIS — R102 Pelvic and perineal pain: Secondary | ICD-10-CM | POA: Diagnosis not present

## 2023-11-08 DIAGNOSIS — R279 Unspecified lack of coordination: Secondary | ICD-10-CM | POA: Insufficient documentation

## 2023-11-08 DIAGNOSIS — M62838 Other muscle spasm: Secondary | ICD-10-CM | POA: Insufficient documentation

## 2023-11-08 DIAGNOSIS — M5459 Other low back pain: Secondary | ICD-10-CM | POA: Diagnosis not present

## 2023-11-08 DIAGNOSIS — M6281 Muscle weakness (generalized): Secondary | ICD-10-CM | POA: Diagnosis not present

## 2023-11-08 DIAGNOSIS — R293 Abnormal posture: Secondary | ICD-10-CM | POA: Diagnosis not present

## 2023-11-08 NOTE — Patient Instructions (Addendum)
 Squatty potty: When your knees are level or below the level of your hips, pelvic floor muscles are pressed against rectum, preventing ease of bowel movement. By getting knees above the level of the hips, these pelvic floor muscles relax, allowing easier passage of bowel movement. ? Ways to get knees above hips: o Squatty Potty (7inch and 9inch versions) o Small stool o Roll of toilet paper under each foot o Hardback book or stack of magazines under each foot  Relaxed Toileting mechanics: Once in this position, make sure to lean forward with forearms on thighs, wide knees, relaxed stomach, and breathe.     .Double-voiding:  This technique is to help with post-void dribbling, or leaking a little bit when you stand up right after urinating. Use relaxed toileting mechanics to urinate as much as you feel like you have to without straining. Sit back upright from leaning forward and relax this way for 10-20 seconds. Lean forward again to finish voiding any amount more.      The first picture shows that there is no effect on the pelvic floor with gravity eliminated. The next three show that with a wedge pillow or a few pillows from home under your pelvis the pelvic floor is inverted and may relax and allows gravity to help return prolapsed areas more inward to help relieve symptoms. Do this 15-20 mins every evening when symptoms tend to be worse. Stop if you have pain or negative symptoms.  **Do several times a day  Vulvar/vaginal Massage: This is a technique to help decrease painful sensitivity in the vaginal area. It can also help to restore normal moisture levels in the vaginal tissues. With coconut oil, aloe, jojoba oil, or a specific vaginal moisturizer, gently massage into vaginal tissues. Think of this as part of your post-shower routine and moisturizing just like you would the rest of the body with lotion. This helps to increase good blood flow to the vaginal tissues. In  addition, it also teaches the body that touch to the vagina does not have to be painful or threatening, but moisturizing and gentle.  *each time you go to the bathroom try to push any tissue back up and in; also moisturize with coconut oil before you do that.   [facility] [address], [address] [place], [place] [address] Phone # [phone] Fax [phone] .

## 2023-11-08 NOTE — Therapy (Signed)
 OUTPATIENT PHYSICAL THERAPY FEMALE PELVIC EVALUATION   Patient Name: Brooke [name] MRN: [id number] DOB:16-Jan-1935, 89 y.o., female Today's Date: 11/08/2023  END OF SESSION:  PT End of Session - 11/08/23 0931     Visit Number 1    Date for PT Re-Evaluation 01/31/24    Authorization Type HTA    Progress Note Due on Visit 10    PT Start Time 0931    PT Stop Time 1010    PT Time Calculation (min) 39 min    Activity Tolerance Patient tolerated treatment well    Behavior During Therapy [facility] for tasks assessed/performed          Past Medical History:  Diagnosis Date   Aortic insufficiency    mild by cMRI 11/2021   CAD (coronary artery disease), native coronary artery    coronary Ca score 153 with mild calcified plaque in the proximal LAD 25-49% by coronary CTA 08/2020   Chest pain    Chronic kidney disease    Diverticulitis    HTN (hypertension)    IBS (irritable bowel syndrome)    Osteopenia    Pulmonic regurgitation    moderate by echo 05/2023   Past Surgical History:  Procedure Laterality Date   CARDIAC CATHETERIZATION     CHOLECYSTECTOMY N/A 12/15/2021   Procedure: LAPAROSCOPIC CHOLECYSTECTOMY;  Surgeon: Aron Shoulders, MD;  Location: [facility] OR;  Service: General;  Laterality: N/A;   DILATION AND CURETTAGE OF UTERUS     Patient Active Problem List   Diagnosis Date Noted   HOCM (hypertrophic obstructive cardiomyopathy) (HCC) 09/04/2023   Pulmonic regurgitation    Chronic calculous cholecystitis 12/15/2021   CAD (coronary artery disease), native coronary artery    Hypertensive heart disease 06/08/2016   Aortic insufficiency 03/22/2015   Essential hypertension 03/22/2015   LVH (left ventricular hypertrophy) 03/22/2015   Abnormal PFT 09/17/2014   SOB (shortness of breath) 09/17/2014   DOE (dyspnea on exertion) 09/08/2014    PCP: Verena Mems, MD  REFERRING PROVIDER: Verena Mems, MD  REFERRING DIAG: N81.9 (ICD-10-CM) - Female genital prolapse,  unspecified  THERAPY DIAG:  Muscle weakness (generalized)  Unspecified lack of coordination  Other muscle spasm  Other low back pain  Pelvic pain  Abnormal posture  Rationale for Evaluation and Treatment: Rehabilitation  ONSET DATE: 02/16/2023  SUBJECTIVE:                                                                                                                                                                                           SUBJECTIVE STATEMENT: Pt states that her bladder is falling down and is bothering her now as  of 8 months ago.    PAIN:  Are you having pain? Yes NPRS scale: 6/10 Pain location: low abdomen over bladder; low back pain  Pain type: urgency Pain description: intermittent   Aggravating factors: having to urinate or have bowel movement  Relieving factors: being able to relieve herself  PRECAUTIONS: None  RED FLAGS: None   WEIGHT BEARING RESTRICTIONS: No  FALLS:  Has patient fallen in last 6 months? No  OCCUPATION: teacher  ACTIVITY LEVEL : walking  PLOF: Independent  PATIENT GOALS: be able to empty bladder better; empty bowels better; prevent things from getting worse  PERTINENT HISTORY:  Coronary artery disease, aortic insufficiency, chest pain, chronic kidney disease, IBS, osteopenia, diverticulitis Sexual abuse: No  BOWEL MOVEMENT: Pain with bowel movement: Yes Type of bowel movement:Type (Bristol Stool Scale) 1-7, Frequency 6x/day, Strain sometimes, and Splinting she will press in abdomen, but not perineum Fully empty rectum: No - feels like she has t ogo all day long Leakage: No Pads: Yes: see below Fiber supplement/laxative none  URINATION: Pain with urination: Yes Fully empty bladder: No Stream: hard to start, starts and stops - has to spread legs and bend way over to restart Urgency: Yes  Frequency: unsure during the day because it is so hard for urine to come out; she can always urinate at night and in the  morning; nocturia 2-3x/night Fluid Intake:  Leakage: Urge to void, Walking to the bathroom, and standing up Pads: Yes: 4-5 a day  INTERCOURSE: Not sexually active, no history of pain  PREGNANCY: Vaginal deliveries 4 Tearing Yes: - Episiotomy No C-section deliveries 0 Currently pregnant No  PROLAPSE: Pressure and Bulge   OBJECTIVE:  Note: Objective measures were completed at Evaluation unless otherwise noted.  11/08/23 PATIENT SURVEYS:   PFIQ-7: 62  COGNITION: Overall cognitive status: Within functional limits for tasks assessed     SENSATION: Light touch: Appears intact   FUNCTIONAL TESTS:  Squat: bil valgus knee collapse Single leg stance:  Rt: pelvic drop  Lt: pelvic drop Curl-up test: large distortion    GAIT: Assistive device utilized: None Comments: decreased bil hip extension  POSTURE: rounded shoulders, forward head, decreased lumbar lordosis, increased thoracic kyphosis, posterior pelvic tilt, and significant Rt iliac crest elevation   LUMBARAROM/PROM:  A/PROM A/PROM  Eval (% available)  Flexion 50  Extension 10  Right lateral flexion 50  Left lateral flexion 50  Right rotation 75  Left rotation 75   (Blank rows = not tested)  PALPATION:   General: tightness throughout bil lumbar paraspinals and glutes  Pelvic Alignment: Rt iliac crest elevation  Abdominal: increased sternocostal angle; tenderness in Lt lower quadrant                External Perineal Exam: presence of Grade 4 uterine ligament laxity; dry tissue                             Internal Pelvic Floor: low tone, no tenderness  Patient confirms identification and approves PT to assess internal pelvic floor and treatment Yes  PELVIC MMT:   MMT eval  Vaginal 1/5, 3 second hold, 10 repeat contractions  Diastasis Recti 3 finger widths separation  (Blank rows = not tested)        TONE: low  PROLAPSE: Grade 4 uterine prolapse tested in supine   TODAY'S TREATMENT:  DATE:  11/08/23 EVAL  Neuromuscular re-education: Pt provides verbal consent for internal vaginal/rectal pelvic floor exam. Internal vaginal pelvic floor muscle contraction training Quick flicks Long holds Urge drill Therapeutic activities: Squatty potty Double voiding Pressure management concepts Vulvovaginal massage - every time she goes to the bathroom and push any prolapsed tissue back into vagina    PATIENT EDUCATION:  Education details: See above Person educated: Patient Education method: Explanation, Demonstration, Tactile cues, Verbal cues, and Handouts Education comprehension: verbalized understanding  HOME EXERCISE PROGRAM: [id number]  ASSESSMENT:  CLINICAL IMPRESSION: Patient is a 89 y.o. female who was seen today for physical therapy evaluation and treatment for prolapse, incomplete bowel/bladder emptying, and urinary incontinence. Exam findings notable for significant decrease in lumbar A/ROM, abnormal posture, decreased pelvic stability in single leg stance, bil hip weakness with squat, diastasis recti abdominus with distortion, grade 4 uterine ligament laxity, pelvic floor muscle weakness, decreased pelvic floor muscle endurance, and dry and fragile vulvar/vaginal tissue. Signs and symptoms are most consistent with significant uterine ligament laxity, pelvic floor muscle weakness, and poor pressure management/core weakness; We discussed role of PT in her condition and that we will likely not be able to reduce the level of uterine prolapse that she has, but we can possibly strengthen pelvic floor muscles enough that she may be able to wear a pessary and have reduction in symptoms to improve her quality of life. Believe that strengthening and lifestyle management will be very helpful with this and give her some relief.   OBJECTIVE IMPAIRMENTS: decreased  activity tolerance, decreased coordination, decreased endurance, decreased mobility, decreased ROM, decreased strength, increased fascial restrictions, increased muscle spasms, impaired flexibility, impaired tone, improper body mechanics, postural dysfunction, and pain.   ACTIVITY LIMITATIONS: bending, standing, squatting, transfers, bed mobility, and continence  PARTICIPATION LIMITATIONS: cleaning, interpersonal relationship, community activity, and care taking  PERSONAL FACTORS: 3+ comorbidities: medical history are also affecting patient's functional outcome.   REHAB POTENTIAL: Good  CLINICAL DECISION MAKING: Evolving/moderate complexity  EVALUATION COMPLEXITY: Moderate   GOALS: Goals reviewed with patient? Yes  SHORT TERM GOALS: Target date: 12/06/2023   Pt will be independent with HEP.   Baseline: Goal status: INITIAL  2.  Pt will be independent with applying vaginal moisturizer to vulva, introitus, and any prolapsed tissue in order to decrease discomfort and improve tissue integrity.  Baseline:  Goal status: INITIAL  3.  Pt will be independent with reducing any prolapsed tissue back into vaginal canal with moisturizer in order to decrease discomfort and help more completely empty bladder to reduce risk of infection.  Baseline:  Goal status: INITIAL  4.  Pt will be independent with urge drill and double voiding in order to improve ability to get to the bathroom and completely empty bladder.  Baseline:  Goal status: INITIAL  5.  Pt will be able to correctly perform diaphragmatic breathing and appropriate pressure management in order to prevent worsening vaginal wall/uterine ligament laxity and improve pelvic floor A/ROM.   Baseline:  Goal status: INITIAL  6.  Pt will improve pelvic floor muscle strength to 2/5 in order to decrease urinary incontinence and pad use to 3 pads a day.  Baseline:  Goal status: INITIAL  LONG TERM GOALS: Target date: 01/31/24  Pt will be  independent with advanced HEP.   Baseline:  Goal status: INITIAL  2.  Pt will report complete bladder emptying in order to reduce risk for infection.  Baseline:  Goal status: INITIAL  3.  Pt will be able  to go 2-3 hours in between voids without urgency or incontinence in order to improve QOL and perform all functional activities with less difficulty.   Baseline:  Goal status: INITIAL  4.  Pt will report no episodes of incontinence with sit<>stand transition.  Baseline:  Goal status: INITIAL  5.  Pt will increase pelvic floor muscle strength to 3/5 in order to reduce pad use to no more than 2 a day. Baseline:  Goal status: INITIAL  6.  Patient will increase pelvic floor muscle strength to 3/5 in order to re Baseline:  Goal status: INITIAL  PLAN:  PT FREQUENCY: 1-2x/week  PT DURATION: 12 weeks   PLANNED INTERVENTIONS: 97110-Therapeutic exercises, 97530- Therapeutic activity, [id number]- Neuromuscular re-education, 97535- Self Care, 02859- Manual therapy, 20560 (1-2 muscles), 20561 (3+ muscles), 02886- Aquatic Therapy, and Biofeedback  PLAN FOR NEXT SESSION: core training; pressure management; inverted activities  Josette Mares, PT, DPT07/24/2512:49 PM

## 2023-11-12 ENCOUNTER — Ambulatory Visit

## 2023-11-12 DIAGNOSIS — M62838 Other muscle spasm: Secondary | ICD-10-CM

## 2023-11-12 DIAGNOSIS — R293 Abnormal posture: Secondary | ICD-10-CM

## 2023-11-12 DIAGNOSIS — M6281 Muscle weakness (generalized): Secondary | ICD-10-CM

## 2023-11-12 DIAGNOSIS — R102 Pelvic and perineal pain: Secondary | ICD-10-CM

## 2023-11-12 DIAGNOSIS — R279 Unspecified lack of coordination: Secondary | ICD-10-CM

## 2023-11-12 DIAGNOSIS — M5459 Other low back pain: Secondary | ICD-10-CM

## 2023-11-12 NOTE — Therapy (Signed)
 OUTPATIENT PHYSICAL THERAPY FEMALE PELVIC TREATMENT   Patient Name: Brooke [name] MRN: [id number] DOB:06-05-1934, 89 y.o., female Today's Date: 11/12/2023  END OF SESSION:  PT End of Session - 11/12/23 1449     Visit Number 2    Date for PT Re-Evaluation 01/31/24    Authorization Type HTA    Progress Note Due on Visit 10    PT Start Time 1445    PT Stop Time 1529    PT Time Calculation (min) 44 min    Activity Tolerance Patient tolerated treatment well    Behavior During Therapy [facility] for tasks assessed/performed           Past Medical History:  Diagnosis Date   Aortic insufficiency    mild by cMRI 11/2021   CAD (coronary artery disease), native coronary artery    coronary Ca score 153 with mild calcified plaque in the proximal LAD 25-49% by coronary CTA 08/2020   Chest pain    Chronic kidney disease    Diverticulitis    HTN (hypertension)    IBS (irritable bowel syndrome)    Osteopenia    Pulmonic regurgitation    moderate by echo 05/2023   Past Surgical History:  Procedure Laterality Date   CARDIAC CATHETERIZATION     CHOLECYSTECTOMY N/A 12/15/2021   Procedure: LAPAROSCOPIC CHOLECYSTECTOMY;  Surgeon: Aron Shoulders, MD;  Location: [facility] OR;  Service: General;  Laterality: N/A;   DILATION AND CURETTAGE OF UTERUS     Patient Active Problem List   Diagnosis Date Noted   HOCM (hypertrophic obstructive cardiomyopathy) (HCC) 09/04/2023   Pulmonic regurgitation    Chronic calculous cholecystitis 12/15/2021   CAD (coronary artery disease), native coronary artery    Hypertensive heart disease 06/08/2016   Aortic insufficiency 03/22/2015   Essential hypertension 03/22/2015   LVH (left ventricular hypertrophy) 03/22/2015   Abnormal PFT 09/17/2014   SOB (shortness of breath) 09/17/2014   DOE (dyspnea on exertion) 09/08/2014    PCP: Verena Mems, MD  REFERRING PROVIDER: Verena Mems, MD  REFERRING DIAG: N81.9 (ICD-10-CM) - Female genital prolapse,  unspecified  THERAPY DIAG:  Muscle weakness (generalized)  Unspecified lack of coordination  Other muscle spasm  Other low back pain  Pelvic pain  Abnormal posture  Rationale for Evaluation and Treatment: Rehabilitation  ONSET DATE: 02/16/2023  SUBJECTIVE:                                                                                                                                                                                           SUBJECTIVE STATEMENT: Pt states that she had a really difficult yesterday not being able to  urinate and then a large accidents when she had a kitchen full of people. She feels like moisturizing was very helpful.    PAIN: 11/12/23 Are you having pain? Yes NPRS scale: 4/10 Pain location: low abdomen over bladder; low back pain  Pain type: urgency Pain description: intermittent   Aggravating factors: having to urinate or have bowel movement  Relieving factors: being able to relieve herself  PRECAUTIONS: None  RED FLAGS: None   WEIGHT BEARING RESTRICTIONS: No  FALLS:  Has patient fallen in last 6 months? No  OCCUPATION: teacher  ACTIVITY LEVEL : walking  PLOF: Independent  PATIENT GOALS: be able to empty bladder better; empty bowels better; prevent things from getting worse  PERTINENT HISTORY:  Coronary artery disease, aortic insufficiency, chest pain, chronic kidney disease, IBS, osteopenia, diverticulitis Sexual abuse: No  BOWEL MOVEMENT: Pain with bowel movement: Yes Type of bowel movement:Type (Bristol Stool Scale) 1-7, Frequency 6x/day, Strain sometimes, and Splinting she will press in abdomen, but not perineum Fully empty rectum: No - feels like she has t ogo all day long Leakage: No Pads: Yes: see below Fiber supplement/laxative none  URINATION: Pain with urination: Yes Fully empty bladder: No Stream: hard to start, starts and stops - has to spread legs and bend way over to restart Urgency: Yes  Frequency:  unsure during the day because it is so hard for urine to come out; she can always urinate at night and in the morning; nocturia 2-3x/night Fluid Intake:  Leakage: Urge to void, Walking to the bathroom, and standing up Pads: Yes: 4-5 a day  INTERCOURSE: Not sexually active, no history of pain  PREGNANCY: Vaginal deliveries 4 Tearing Yes: - Episiotomy No C-section deliveries 0 Currently pregnant No  PROLAPSE: Pressure and Bulge   OBJECTIVE:  Note: Objective measures were completed at Evaluation unless otherwise noted.  11/08/23 PATIENT SURVEYS:   PFIQ-7: 62  COGNITION: Overall cognitive status: Within functional limits for tasks assessed     SENSATION: Light touch: Appears intact   FUNCTIONAL TESTS:  Squat: bil valgus knee collapse Single leg stance:  Rt: pelvic drop  Lt: pelvic drop Curl-up test: large distortion    GAIT: Assistive device utilized: None Comments: decreased bil hip extension  POSTURE: rounded shoulders, forward head, decreased lumbar lordosis, increased thoracic kyphosis, posterior pelvic tilt, and significant Rt iliac crest elevation   LUMBARAROM/PROM:  A/PROM A/PROM  Eval (% available)  Flexion 50  Extension 10  Right lateral flexion 50  Left lateral flexion 50  Right rotation 75  Left rotation 75   (Blank rows = not tested)  PALPATION:   General: tightness throughout bil lumbar paraspinals and glutes  Pelvic Alignment: Rt iliac crest elevation  Abdominal: increased sternocostal angle; tenderness in Lt lower quadrant                External Perineal Exam: presence of Grade 4 uterine ligament laxity; dry tissue                             Internal Pelvic Floor: low tone, no tenderness  Patient confirms identification and approves PT to assess internal pelvic floor and treatment Yes  PELVIC MMT:   MMT eval  Vaginal 1/5, 3 second hold, 10 repeat contractions  Diastasis Recti 3 finger widths separation  (Blank rows = not  tested)        TONE: low  PROLAPSE: Grade 4 uterine prolapse tested in supine  TODAY'S TREATMENT:                                                                                                                              DATE:  11/12/26 Neuromuscular re-education: Transversus abdominus training with multimodal cues for improved motor control and breath coordination Transversus abdominus isometrics 2 x 10 with tactile cues the entire time for improved confidence and coordination  Bil supine UE ball press with transversus abdominus and pelvic floor muscle contractions and breath coordination 10x Supine hip adduction ball press with transversus abdominus and pelvic floor muscle contractions and breath coordination 10x Bridge with hip adduction, transversus abdominus, and pelvic floor muscle 2 x 10 Supine resisted march 10x bil Bridge with hip abduction red band, transversus abdominus, and pelvic floor muscle contraction 2 x 10 Therapeutic activities: Revisited inverted lying position and performing contractions here Pressure management with bed mobility - breathing and core activation   11/08/23 EVAL  Neuromuscular re-education: Pt provides verbal consent for internal vaginal/rectal pelvic floor exam. Internal vaginal pelvic floor muscle contraction training Quick flicks Long holds Urge drill Therapeutic activities: Squatty potty Double voiding Pressure management concepts Vulvovaginal massage - every time she goes to the bathroom and push any prolapsed tissue back into vagina    PATIENT EDUCATION:  Education details: See above Person educated: Patient Education method: Programmer, multimedia, Demonstration, Tactile cues, Verbal cues, and Handouts Education comprehension: verbalized understanding  HOME EXERCISE PROGRAM: [id number]  ASSESSMENT:  CLINICAL IMPRESSION: Patient is a 89 y.o. female who was seen today for physical therapy evaluation and treatment for prolapse,  incomplete bowel/bladder emptying, and urinary incontinence. She did have some relief with moisturizing perineal area and prolapse on more regular basis since last visit. She has not been able to practice inverted lying position, but plans to and we reviewed also performing pelvic floor muscle contractions in this position. We performed several exercises in bridge position to help use gravity to our advantage in reducing any prolapse. She did very well with core contraction/coordination, but she had trouble with propriocpetion of this. She as able to improve throughout session. HEP updated. She will continue to benefit from skilled PT intervention in order to improve pressure management, improve bladder and bowel control, increase pelvic floor muscle and core strength, improve quality of life, and progress strengthening program.   OBJECTIVE IMPAIRMENTS: decreased activity tolerance, decreased coordination, decreased endurance, decreased mobility, decreased ROM, decreased strength, increased fascial restrictions, increased muscle spasms, impaired flexibility, impaired tone, improper body mechanics, postural dysfunction, and pain.   ACTIVITY LIMITATIONS: bending, standing, squatting, transfers, bed mobility, and continence  PARTICIPATION LIMITATIONS: cleaning, interpersonal relationship, community activity, and care taking  PERSONAL FACTORS: 3+ comorbidities: medical history are also affecting patient's functional outcome.   REHAB POTENTIAL: Good  CLINICAL DECISION MAKING: Evolving/moderate complexity  EVALUATION COMPLEXITY: Moderate   GOALS: Goals reviewed with patient? Yes  SHORT TERM GOALS: Target date: 12/06/2023   Pt will be independent with HEP.  Baseline: Goal status: INITIAL  2.  Pt will be independent with applying vaginal moisturizer to vulva, introitus, and any prolapsed tissue in order to decrease discomfort and improve tissue integrity.  Baseline:  Goal status: INITIAL  3.   Pt will be independent with reducing any prolapsed tissue back into vaginal canal with moisturizer in order to decrease discomfort and help more completely empty bladder to reduce risk of infection.  Baseline:  Goal status: INITIAL  4.  Pt will be independent with urge drill and double voiding in order to improve ability to get to the bathroom and completely empty bladder.  Baseline:  Goal status: INITIAL  5.  Pt will be able to correctly perform diaphragmatic breathing and appropriate pressure management in order to prevent worsening vaginal wall/uterine ligament laxity and improve pelvic floor A/ROM.   Baseline:  Goal status: INITIAL  6.  Pt will improve pelvic floor muscle strength to 2/5 in order to decrease urinary incontinence and pad use to 3 pads a day.  Baseline:  Goal status: INITIAL  LONG TERM GOALS: Target date: 01/31/24  Pt will be independent with advanced HEP.   Baseline:  Goal status: INITIAL  2.  Pt will report complete bladder emptying in order to reduce risk for infection.  Baseline:  Goal status: INITIAL  3.  Pt will be able to go 2-3 hours in between voids without urgency or incontinence in order to improve QOL and perform all functional activities with less difficulty.   Baseline:  Goal status: INITIAL  4.  Pt will report no episodes of incontinence with sit<>stand transition.  Baseline:  Goal status: INITIAL  5.  Pt will increase pelvic floor muscle strength to 3/5 in order to reduce pad use to no more than 2 a day. Baseline:  Goal status: INITIAL  6.  Patient will increase pelvic floor muscle strength to 3/5 in order to re Baseline:  Goal status: INITIAL  PLAN:  PT FREQUENCY: 1-2x/week  PT DURATION: 12 weeks   PLANNED INTERVENTIONS: 97110-Therapeutic exercises, 97530- Therapeutic activity, [id number]- Neuromuscular re-education, 97535- Self Care, 02859- Manual therapy, 20560 (1-2 muscles), 20561 (3+ muscles), 02886- Aquatic Therapy, and  Biofeedback  PLAN FOR NEXT SESSION: core training; pressure management; inverted activities  Josette Mares, PT, DPT07/28/253:33 PM

## 2023-11-13 ENCOUNTER — Other Ambulatory Visit: Payer: Self-pay | Admitting: Internal Medicine

## 2023-11-14 ENCOUNTER — Other Ambulatory Visit (HOSPITAL_COMMUNITY)

## 2023-11-15 DIAGNOSIS — E039 Hypothyroidism, unspecified: Secondary | ICD-10-CM | POA: Diagnosis not present

## 2023-11-15 DIAGNOSIS — E782 Mixed hyperlipidemia: Secondary | ICD-10-CM | POA: Diagnosis not present

## 2023-11-15 DIAGNOSIS — I10 Essential (primary) hypertension: Secondary | ICD-10-CM | POA: Diagnosis not present

## 2023-11-15 DIAGNOSIS — F330 Major depressive disorder, recurrent, mild: Secondary | ICD-10-CM | POA: Diagnosis not present

## 2023-11-20 ENCOUNTER — Ambulatory Visit (HOSPITAL_COMMUNITY)
Admission: RE | Admit: 2023-11-20 | Discharge: 2023-11-20 | Disposition: A | Discharge status: Home / Self Care | Source: Ambulatory Visit | Attending: Cardiology | Admitting: Cardiology

## 2023-11-20 DIAGNOSIS — I421 Obstructive hypertrophic cardiomyopathy: Secondary | ICD-10-CM | POA: Insufficient documentation

## 2023-11-22 ENCOUNTER — Ambulatory Visit: Payer: Self-pay | Attending: Family Medicine

## 2023-11-22 ENCOUNTER — Ambulatory Visit: Payer: Self-pay | Admitting: Cardiology

## 2023-11-22 DIAGNOSIS — M5459 Other low back pain: Secondary | ICD-10-CM | POA: Diagnosis not present

## 2023-11-22 DIAGNOSIS — R293 Abnormal posture: Secondary | ICD-10-CM | POA: Insufficient documentation

## 2023-11-22 DIAGNOSIS — R102 Pelvic and perineal pain: Secondary | ICD-10-CM | POA: Diagnosis not present

## 2023-11-22 DIAGNOSIS — M62838 Other muscle spasm: Secondary | ICD-10-CM | POA: Diagnosis not present

## 2023-11-22 DIAGNOSIS — R279 Unspecified lack of coordination: Secondary | ICD-10-CM | POA: Insufficient documentation

## 2023-11-22 DIAGNOSIS — M6281 Muscle weakness (generalized): Secondary | ICD-10-CM | POA: Diagnosis not present

## 2023-11-22 LAB — ECHOCARDIOGRAM SQUAT TO STAND
Area-P 1/2: 3.08 cm2
P 1/2 time: 412 ms
S' Lateral: 2.2 cm

## 2023-11-22 NOTE — Therapy (Signed)
 OUTPATIENT PHYSICAL THERAPY FEMALE PELVIC TREATMENT   Patient Name: Brooke [name] MRN: [id number] DOB:Dec 07, 1934, 89 y.o., female Today's Date: 11/22/2023  END OF SESSION:  PT End of Session - 11/22/23 1058     Visit Number 3    Date for PT Re-Evaluation 01/31/24    Authorization Type HTA    Progress Note Due on Visit 10    PT Start Time 1100    PT Stop Time 1140    PT Time Calculation (min) 40 min    Activity Tolerance Patient tolerated treatment well    Behavior During Therapy [facility] for tasks assessed/performed           Past Medical History:  Diagnosis Date   Aortic insufficiency    mild by cMRI 11/2021   CAD (coronary artery disease), native coronary artery    coronary Ca score 153 with mild calcified plaque in the proximal LAD 25-49% by coronary CTA 08/2020   Chest pain    Chronic kidney disease    Diverticulitis    HTN (hypertension)    IBS (irritable bowel syndrome)    Osteopenia    Pulmonic regurgitation    moderate by echo 05/2023   Past Surgical History:  Procedure Laterality Date   CARDIAC CATHETERIZATION     CHOLECYSTECTOMY N/A 12/15/2021   Procedure: LAPAROSCOPIC CHOLECYSTECTOMY;  Surgeon: Aron Shoulders, MD;  Location: [facility] OR;  Service: General;  Laterality: N/A;   DILATION AND CURETTAGE OF UTERUS     Patient Active Problem List   Diagnosis Date Noted   HOCM (hypertrophic obstructive cardiomyopathy) (HCC) 09/04/2023   Pulmonic regurgitation    Chronic calculous cholecystitis 12/15/2021   CAD (coronary artery disease), native coronary artery    Hypertensive heart disease 06/08/2016   Aortic insufficiency 03/22/2015   Essential hypertension 03/22/2015   LVH (left ventricular hypertrophy) 03/22/2015   Abnormal PFT 09/17/2014   SOB (shortness of breath) 09/17/2014   DOE (dyspnea on exertion) 09/08/2014    PCP: Verena Mems, MD  REFERRING PROVIDER: Verena Mems, MD  REFERRING DIAG: N81.9 (ICD-10-CM) - Female genital prolapse,  unspecified  THERAPY DIAG:  Muscle weakness (generalized)  Unspecified lack of coordination  Other muscle spasm  Other low back pain  Pelvic pain  Abnormal posture  Rationale for Evaluation and Treatment: Rehabilitation  ONSET DATE: 02/16/2023  SUBJECTIVE:                                                                                                                                                                                           SUBJECTIVE STATEMENT: Pt states that she might be feeling a little better. She has not  had as many episodes of pain because she feels like squatty potty is working. She is still having bowel movements all day long. She states that vaginal pressure is getting better and she does not feel it as much.   PAIN: 11/22/23/25 Are you having pain? Yes NPRS scale: 1/10 Pain location: low abdomen over bladder; low back pain  Pain type: urgency Pain description: intermittent   Aggravating factors: having to urinate or have bowel movement  Relieving factors: being able to relieve herself  PRECAUTIONS: None  RED FLAGS: None   WEIGHT BEARING RESTRICTIONS: No  FALLS:  Has patient fallen in last 6 months? No  OCCUPATION: teacher  ACTIVITY LEVEL : walking  PLOF: Independent  PATIENT GOALS: be able to empty bladder better; empty bowels better; prevent things from getting worse  PERTINENT HISTORY:  Coronary artery disease, aortic insufficiency, chest pain, chronic kidney disease, IBS, osteopenia, diverticulitis Sexual abuse: No  BOWEL MOVEMENT: Pain with bowel movement: Yes Type of bowel movement:Type (Bristol Stool Scale) 1-7, Frequency 6x/day, Strain sometimes, and Splinting she will press in abdomen, but not perineum Fully empty rectum: No - feels like she has t ogo all day long Leakage: No Pads: Yes: see below Fiber supplement/laxative none  URINATION: Pain with urination: Yes Fully empty bladder: No Stream: hard to start, starts and  stops - has to spread legs and bend way over to restart Urgency: Yes  Frequency: unsure during the day because it is so hard for urine to come out; she can always urinate at night and in the morning; nocturia 2-3x/night Fluid Intake:  Leakage: Urge to void, Walking to the bathroom, and standing up Pads: Yes: 4-5 a day  INTERCOURSE: Not sexually active, no history of pain  PREGNANCY: Vaginal deliveries 4 Tearing Yes: - Episiotomy No C-section deliveries 0 Currently pregnant No  PROLAPSE: Pressure and Bulge   OBJECTIVE:  Note: Objective measures were completed at Evaluation unless otherwise noted.  11/08/23 PATIENT SURVEYS:   PFIQ-7: 62  COGNITION: Overall cognitive status: Within functional limits for tasks assessed     SENSATION: Light touch: Appears intact   FUNCTIONAL TESTS:  Squat: bil valgus knee collapse Single leg stance:  Rt: pelvic drop  Lt: pelvic drop Curl-up test: large distortion    GAIT: Assistive device utilized: None Comments: decreased bil hip extension  POSTURE: rounded shoulders, forward head, decreased lumbar lordosis, increased thoracic kyphosis, posterior pelvic tilt, and significant Rt iliac crest elevation   LUMBARAROM/PROM:  A/PROM A/PROM  Eval (% available)  Flexion 50  Extension 10  Right lateral flexion 50  Left lateral flexion 50  Right rotation 75  Left rotation 75   (Blank rows = not tested)  PALPATION:   General: tightness throughout bil lumbar paraspinals and glutes  Pelvic Alignment: Rt iliac crest elevation  Abdominal: increased sternocostal angle; tenderness in Lt lower quadrant                External Perineal Exam: presence of Grade 4 uterine ligament laxity; dry tissue                             Internal Pelvic Floor: low tone, no tenderness  Patient confirms identification and approves PT to assess internal pelvic floor and treatment Yes  PELVIC MMT:   MMT eval  Vaginal 1/5, 3 second hold, 10 repeat  contractions  Diastasis Recti 3 finger widths separation  (Blank rows = not tested)  TONE: low  PROLAPSE: Grade 4 uterine prolapse tested in supine   TODAY'S TREATMENT:                                                                                                                              DATE:  11/22/23 Manual: Supine bowel mobilization ILU massage External bladder mobilization and lift Neuromuscular re-education: Bridge with hip adduction, transversus abdominus, and pelvic floor muscle 2 x 10 Supine leg extensions 10x bil Squat + pelvic floor muscle contraction + adduction 2 x 10 to table Exercises: Side lying clam shells 2 x 10 bil Supine piriformis stretch 60 sec bil   11/12/26 Neuromuscular re-education: Transversus abdominus training with multimodal cues for improved motor control and breath coordination Transversus abdominus isometrics 2 x 10 with tactile cues the entire time for improved confidence and coordination  Bil supine UE ball press with transversus abdominus and pelvic floor muscle contractions and breath coordination 10x Supine hip adduction ball press with transversus abdominus and pelvic floor muscle contractions and breath coordination 10x Bridge with hip adduction, transversus abdominus, and pelvic floor muscle 2 x 10 Supine resisted march 10x bil Bridge with hip abduction red band, transversus abdominus, and pelvic floor muscle contraction 2 x 10 Therapeutic activities: Revisited inverted lying position and performing contractions here Pressure management with bed mobility - breathing and core activation   11/08/23 EVAL  Neuromuscular re-education: Pt provides verbal consent for internal vaginal/rectal pelvic floor exam. Internal vaginal pelvic floor muscle contraction training Quick flicks Long holds Urge drill Therapeutic activities: Squatty potty Double voiding Pressure management concepts Vulvovaginal massage - every time she goes  to the bathroom and push any prolapsed tissue back into vagina    PATIENT EDUCATION:  Education details: See above Person educated: Patient Education method: Programmer, multimedia, Demonstration, Tactile cues, Verbal cues, and Handouts Education comprehension: verbalized understanding  HOME EXERCISE PROGRAM: [id number]  ASSESSMENT:  CLINICAL IMPRESSION: Patient is a 89 y.o. female who was seen today for physical therapy evaluation and treatment for prolapse, incomplete bowel/bladder emptying, and urinary incontinence. Pt is starting to see some progress with abdominal pain and vaginal pressure. We started treatment with manual techniques to bowel and bladder with good tolerance; she was instructed to perform bowel mobilization at home. Good tolerance to all exercise progressions, but she does have difficulty with breath coordination. She will continue to benefit from skilled PT intervention in order to improve pressure management, improve bladder and bowel control, increase pelvic floor muscle and core strength, improve quality of life, and progress strengthening program.   OBJECTIVE IMPAIRMENTS: decreased activity tolerance, decreased coordination, decreased endurance, decreased mobility, decreased ROM, decreased strength, increased fascial restrictions, increased muscle spasms, impaired flexibility, impaired tone, improper body mechanics, postural dysfunction, and pain.   ACTIVITY LIMITATIONS: bending, standing, squatting, transfers, bed mobility, and continence  PARTICIPATION LIMITATIONS: cleaning, interpersonal relationship, community activity, and care taking  PERSONAL FACTORS: 3+ comorbidities: medical history are also affecting patient's functional outcome.  REHAB POTENTIAL: Good  CLINICAL DECISION MAKING: Evolving/moderate complexity  EVALUATION COMPLEXITY: Moderate   GOALS: Goals reviewed with patient? Yes  SHORT TERM GOALS: Target date: 12/06/2023   Pt will be independent with  HEP.   Baseline: Goal status: INITIAL  2.  Pt will be independent with applying vaginal moisturizer to vulva, introitus, and any prolapsed tissue in order to decrease discomfort and improve tissue integrity.  Baseline:  Goal status: INITIAL  3.  Pt will be independent with reducing any prolapsed tissue back into vaginal canal with moisturizer in order to decrease discomfort and help more completely empty bladder to reduce risk of infection.  Baseline:  Goal status: INITIAL  4.  Pt will be independent with urge drill and double voiding in order to improve ability to get to the bathroom and completely empty bladder.  Baseline:  Goal status: INITIAL  5.  Pt will be able to correctly perform diaphragmatic breathing and appropriate pressure management in order to prevent worsening vaginal wall/uterine ligament laxity and improve pelvic floor A/ROM.   Baseline:  Goal status: INITIAL  6.  Pt will improve pelvic floor muscle strength to 2/5 in order to decrease urinary incontinence and pad use to 3 pads a day.  Baseline:  Goal status: INITIAL  LONG TERM GOALS: Target date: 01/31/24  Pt will be independent with advanced HEP.   Baseline:  Goal status: INITIAL  2.  Pt will report complete bladder emptying in order to reduce risk for infection.  Baseline:  Goal status: INITIAL  3.  Pt will be able to go 2-3 hours in between voids without urgency or incontinence in order to improve QOL and perform all functional activities with less difficulty.   Baseline:  Goal status: INITIAL  4.  Pt will report no episodes of incontinence with sit<>stand transition.  Baseline:  Goal status: INITIAL  5.  Pt will increase pelvic floor muscle strength to 3/5 in order to reduce pad use to no more than 2 a day. Baseline:  Goal status: INITIAL  6.  Patient will increase pelvic floor muscle strength to 3/5 in order to re Baseline:  Goal status: INITIAL  PLAN:  PT FREQUENCY: 1-2x/week  PT  DURATION: 12 weeks   PLANNED INTERVENTIONS: 97110-Therapeutic exercises, 97530- Therapeutic activity, [id number]- Neuromuscular re-education, 97535- Self Care, [id number]- Manual therapy, 20560 (1-2 muscles), 20561 (3+ muscles), 02886- Aquatic Therapy, and Biofeedback  PLAN FOR NEXT SESSION: core training; pressure management; inverted activities  Josette Mares, PT, DPT08/10/2508:59 AM

## 2023-11-23 MED ORDER — CAMZYOS 2.5 MG PO CAPS: 2.5000 mg | ORAL_CAPSULE | Freq: Every day | ORAL | 2 refills | Status: DC

## 2023-11-23 NOTE — Telephone Encounter (Signed)
 Pt denies HF symptoms reports has felt good.  Reports feels tired today but this has been a 1 time occurrence; feels maybe r/t not getting enough sleep.   Advised pt to continue to monitor once has caught up on sleep if still feels bad to contact our office.  Pt reports has not started any new medications OTC or prescribed.    PSF updated on [facility] portal and refill of Camzyos  sent to specialty pharmacy on file.

## 2023-11-26 ENCOUNTER — Ambulatory Visit

## 2023-11-26 DIAGNOSIS — M5459 Other low back pain: Secondary | ICD-10-CM

## 2023-11-26 DIAGNOSIS — M6281 Muscle weakness (generalized): Secondary | ICD-10-CM

## 2023-11-26 DIAGNOSIS — R279 Unspecified lack of coordination: Secondary | ICD-10-CM

## 2023-11-26 DIAGNOSIS — M62838 Other muscle spasm: Secondary | ICD-10-CM

## 2023-11-26 DIAGNOSIS — R293 Abnormal posture: Secondary | ICD-10-CM

## 2023-11-26 DIAGNOSIS — R102 Pelvic and perineal pain: Secondary | ICD-10-CM

## 2023-11-26 NOTE — Therapy (Signed)
 OUTPATIENT PHYSICAL THERAPY FEMALE PELVIC TREATMENT   Patient Name: Brooke [name] MRN: [id number] DOB:12-31-1934, 89 y.o., female Today's Date: 11/26/2023  END OF SESSION:  PT End of Session - 11/26/23 1447     Visit Number 4    Date for PT Re-Evaluation 01/31/24    Authorization Type HTA    Progress Note Due on Visit 10    PT Start Time 1446    PT Stop Time 1526    PT Time Calculation (min) 40 min    Activity Tolerance Patient tolerated treatment well    Behavior During Therapy [facility] for tasks assessed/performed           Past Medical History:  Diagnosis Date   Aortic insufficiency    mild by cMRI 11/2021   CAD (coronary artery disease), native coronary artery    coronary Ca score 153 with mild calcified plaque in the proximal LAD 25-49% by coronary CTA 08/2020   Chest pain    Chronic kidney disease    Diverticulitis    HTN (hypertension)    IBS (irritable bowel syndrome)    Osteopenia    Pulmonic regurgitation    moderate by echo 05/2023   Past Surgical History:  Procedure Laterality Date   CARDIAC CATHETERIZATION     CHOLECYSTECTOMY N/A 12/15/2021   Procedure: LAPAROSCOPIC CHOLECYSTECTOMY;  Surgeon: Aron Shoulders, MD;  Location: [facility] OR;  Service: General;  Laterality: N/A;   DILATION AND CURETTAGE OF UTERUS     Patient Active Problem List   Diagnosis Date Noted   HOCM (hypertrophic obstructive cardiomyopathy) (HCC) 09/04/2023   Pulmonic regurgitation    Chronic calculous cholecystitis 12/15/2021   CAD (coronary artery disease), native coronary artery    Hypertensive heart disease 06/08/2016   Aortic insufficiency 03/22/2015   Essential hypertension 03/22/2015   LVH (left ventricular hypertrophy) 03/22/2015   Abnormal PFT 09/17/2014   SOB (shortness of breath) 09/17/2014   DOE (dyspnea on exertion) 09/08/2014    PCP: Verena Mems, MD  REFERRING PROVIDER: Verena Mems, MD  REFERRING DIAG: N81.9 (ICD-10-CM) - Female genital prolapse,  unspecified  THERAPY DIAG:  Muscle weakness (generalized)  Unspecified lack of coordination  Other muscle spasm  Other low back pain  Pelvic pain  Abnormal posture  Rationale for Evaluation and Treatment: Rehabilitation  ONSET DATE: 02/16/2023  SUBJECTIVE:                                                                                                                                                                                           SUBJECTIVE STATEMENT: Pt states that she had a very large accident yesterday. She feels like  she's getting tired of this.   PAIN: 11/22/23/25 Are you having pain? Yes NPRS scale: 1/10 Pain location: low abdomen over bladder; low back pain  Pain type: urgency Pain description: intermittent   Aggravating factors: having to urinate or have bowel movement  Relieving factors: being able to relieve herself  PRECAUTIONS: None  RED FLAGS: None   WEIGHT BEARING RESTRICTIONS: No  FALLS:  Has patient fallen in last 6 months? No  OCCUPATION: teacher  ACTIVITY LEVEL : walking  PLOF: Independent  PATIENT GOALS: be able to empty bladder better; empty bowels better; prevent things from getting worse  PERTINENT HISTORY:  Coronary artery disease, aortic insufficiency, chest pain, chronic kidney disease, IBS, osteopenia, diverticulitis Sexual abuse: No  BOWEL MOVEMENT: Pain with bowel movement: Yes Type of bowel movement:Type (Bristol Stool Scale) 1-7, Frequency 6x/day, Strain sometimes, and Splinting she will press in abdomen, but not perineum Fully empty rectum: No - feels like she has t ogo all day long Leakage: No Pads: Yes: see below Fiber supplement/laxative none  URINATION: Pain with urination: Yes Fully empty bladder: No Stream: hard to start, starts and stops - has to spread legs and bend way over to restart Urgency: Yes  Frequency: unsure during the day because it is so hard for urine to come out; she can always urinate at  night and in the morning; nocturia 2-3x/night Fluid Intake:  Leakage: Urge to void, Walking to the bathroom, and standing up Pads: Yes: 4-5 a day  INTERCOURSE: Not sexually active, no history of pain  PREGNANCY: Vaginal deliveries 4 Tearing Yes: - Episiotomy No C-section deliveries 0 Currently pregnant No  PROLAPSE: Pressure and Bulge   OBJECTIVE:  Note: Objective measures were completed at Evaluation unless otherwise noted.  11/08/23 PATIENT SURVEYS:   PFIQ-7: 62  COGNITION: Overall cognitive status: Within functional limits for tasks assessed     SENSATION: Light touch: Appears intact   FUNCTIONAL TESTS:  Squat: bil valgus knee collapse Single leg stance:  Rt: pelvic drop  Lt: pelvic drop Curl-up test: large distortion    GAIT: Assistive device utilized: None Comments: decreased bil hip extension  POSTURE: rounded shoulders, forward head, decreased lumbar lordosis, increased thoracic kyphosis, posterior pelvic tilt, and significant Rt iliac crest elevation   LUMBARAROM/PROM:  A/PROM A/PROM  Eval (% available)  Flexion 50  Extension 10  Right lateral flexion 50  Left lateral flexion 50  Right rotation 75  Left rotation 75   (Blank rows = not tested)  PALPATION:   General: tightness throughout bil lumbar paraspinals and glutes  Pelvic Alignment: Rt iliac crest elevation  Abdominal: increased sternocostal angle; tenderness in Lt lower quadrant                External Perineal Exam: presence of Grade 4 uterine ligament laxity; dry tissue                             Internal Pelvic Floor: low tone, no tenderness  Patient confirms identification and approves PT to assess internal pelvic floor and treatment Yes  PELVIC MMT:   MMT eval  Vaginal 1/5, 3 second hold, 10 repeat contractions  Diastasis Recti 3 finger widths separation  (Blank rows = not tested)        TONE: low  PROLAPSE: Grade 4 uterine prolapse tested in supine   TODAY'S  TREATMENT:  DATE:  11/26/23 Neuromuscular re-education: Standing march shoulder flexion at 90 degrees 2lbs 2 x 10 Standing side bend 5 lbs 12x bil Bridge with transversus abdominus, and pelvic floor muscle 10x Therapeutic activities: Pt education: expectations for progress and goals of treatment: symptom reduction and getting stronger in order to be able to have pessary placed  Prior to standing up, perform hip adduction + pelvic floor muscle contraction, thigh press + pelvic floor muscle contraction; once standing perform 10 heel raises  If she cannot empty bladder, go lie down and perform 10 bridges to help reposition and allow better emptying of bladder Squat + pelvic floor muscle + transversus abdominus + hip adduction 2 x 10 Unilateral shoulder extension in standing green band + pelvic floor muscle + transversus abdominus 2 x 10 bil Pallof press green band 2 x 10 bil Standing 3 way kick 10x each, bil    11/22/23 Manual: Supine bowel mobilization ILU massage External bladder mobilization and lift Neuromuscular re-education: Bridge with hip adduction, transversus abdominus, and pelvic floor muscle 2 x 10 Supine leg extensions 10x bil Squat + pelvic floor muscle contraction + adduction 2 x 10 to table Exercises: Side lying clam shells 2 x 10 bil Supine piriformis stretch 60 sec bil   11/12/26 Neuromuscular re-education: Transversus abdominus training with multimodal cues for improved motor control and breath coordination Transversus abdominus isometrics 2 x 10 with tactile cues the entire time for improved confidence and coordination  Bil supine UE ball press with transversus abdominus and pelvic floor muscle contractions and breath coordination 10x Supine hip adduction ball press with transversus abdominus and pelvic floor muscle contractions and breath  coordination 10x Bridge with hip adduction, transversus abdominus, and pelvic floor muscle 2 x 10 Supine resisted march 10x bil Bridge with hip abduction red band, transversus abdominus, and pelvic floor muscle contraction 2 x 10 Therapeutic activities: Revisited inverted lying position and performing contractions here Pressure management with bed mobility - breathing and core activation  PATIENT EDUCATION:  Education details: See above Person educated: Patient Education method: Programmer, multimedia, Demonstration, Actor cues, Verbal cues, and Handouts Education comprehension: verbalized understanding  HOME EXERCISE PROGRAM: [id number]  ASSESSMENT:  CLINICAL IMPRESSION: Patient is a 89 y.o. female who was seen today for physical therapy evaluation and treatment for prolapse, incomplete bowel/bladder emptying, and urinary incontinence. She feels frustrated this week about her symptoms and lack of bladder control. Wediscussed techniques to help with this as she goes from seated  to standing and specific written instructions provided written down. She was able to work into more standing exercises to progress gravity dependent strengthening. She did have some increased sensation of bladder needing to empty without control and she was able to perform 10 bridges to help improve; she reported that it completely reduced sensation. Pt education provided on strengthening core and pelvic floor muscles in order to hopefully be able to make pessary use successful and control her symptoms. She will continue to benefit from skilled PT intervention in order to improve pressure management, improve bladder and bowel control, increase pelvic floor muscle and core strength, improve quality of life, and progress strengthening program.   OBJECTIVE IMPAIRMENTS: decreased activity tolerance, decreased coordination, decreased endurance, decreased mobility, decreased ROM, decreased strength, increased fascial restrictions,  increased muscle spasms, impaired flexibility, impaired tone, improper body mechanics, postural dysfunction, and pain.   ACTIVITY LIMITATIONS: bending, standing, squatting, transfers, bed mobility, and continence  PARTICIPATION LIMITATIONS: cleaning, interpersonal relationship, community activity, and care taking  PERSONAL FACTORS: 3+  comorbidities: medical history are also affecting patient's functional outcome.   REHAB POTENTIAL: Good  CLINICAL DECISION MAKING: Evolving/moderate complexity  EVALUATION COMPLEXITY: Moderate   GOALS: Goals reviewed with patient? Yes  SHORT TERM GOALS: Target date: 12/06/2023   Pt will be independent with HEP.   Baseline: Goal status: INITIAL  2.  Pt will be independent with applying vaginal moisturizer to vulva, introitus, and any prolapsed tissue in order to decrease discomfort and improve tissue integrity.  Baseline:  Goal status: INITIAL  3.  Pt will be independent with reducing any prolapsed tissue back into vaginal canal with moisturizer in order to decrease discomfort and help more completely empty bladder to reduce risk of infection.  Baseline:  Goal status: INITIAL  4.  Pt will be independent with urge drill and double voiding in order to improve ability to get to the bathroom and completely empty bladder.  Baseline:  Goal status: INITIAL  5.  Pt will be able to correctly perform diaphragmatic breathing and appropriate pressure management in order to prevent worsening vaginal wall/uterine ligament laxity and improve pelvic floor A/ROM.   Baseline:  Goal status: INITIAL  6.  Pt will improve pelvic floor muscle strength to 2/5 in order to decrease urinary incontinence and pad use to 3 pads a day.  Baseline:  Goal status: INITIAL  LONG TERM GOALS: Target date: 01/31/24  Pt will be independent with advanced HEP.   Baseline:  Goal status: INITIAL  2.  Pt will report complete bladder emptying in order to reduce risk for  infection.  Baseline:  Goal status: INITIAL  3.  Pt will be able to go 2-3 hours in between voids without urgency or incontinence in order to improve QOL and perform all functional activities with less difficulty.   Baseline:  Goal status: INITIAL  4.  Pt will report no episodes of incontinence with sit<>stand transition.  Baseline:  Goal status: INITIAL  5.  Pt will increase pelvic floor muscle strength to 3/5 in order to reduce pad use to no more than 2 a day. Baseline:  Goal status: INITIAL  6.  Patient will increase pelvic floor muscle strength to 3/5 in order to re Baseline:  Goal status: INITIAL  PLAN:  PT FREQUENCY: 1-2x/week  PT DURATION: 12 weeks   PLANNED INTERVENTIONS: 97110-Therapeutic exercises, 97530- Therapeutic activity, [id number]- Neuromuscular re-education, 97535- Self Care, [id number]- Manual therapy, 20560 (1-2 muscles), 20561 (3+ muscles), 02886- Aquatic Therapy, and Biofeedback  PLAN FOR NEXT SESSION: core training; pressure management; inverted activities  Josette Mares, PT, DPT08/11/253:27 PM

## 2023-11-26 NOTE — Patient Instructions (Signed)
 When you feel like you can't empty bladder: Go lie down on the bed and perform 10 bridge (lifting your hips) with pelvic floor contractions Then try to return to the bathroom to empty bladder When you're about to stand up and worried your bladder will empty: Put hands between knees and squeeze in while contracting pelvic floor 5x Put hands on tops of thighs and press down while contracting your pelvic floor 5x Stand and then immediately perform 5 pelvic floor muscle contractions and/or 10 heel raises

## 2023-12-04 ENCOUNTER — Ambulatory Visit: Payer: Self-pay

## 2023-12-04 DIAGNOSIS — R102 Pelvic and perineal pain: Secondary | ICD-10-CM

## 2023-12-04 DIAGNOSIS — R279 Unspecified lack of coordination: Secondary | ICD-10-CM

## 2023-12-04 DIAGNOSIS — R293 Abnormal posture: Secondary | ICD-10-CM

## 2023-12-04 DIAGNOSIS — M6281 Muscle weakness (generalized): Secondary | ICD-10-CM | POA: Diagnosis not present

## 2023-12-04 DIAGNOSIS — M62838 Other muscle spasm: Secondary | ICD-10-CM

## 2023-12-04 DIAGNOSIS — M5459 Other low back pain: Secondary | ICD-10-CM

## 2023-12-04 NOTE — Therapy (Signed)
 OUTPATIENT PHYSICAL THERAPY FEMALE PELVIC TREATMENT   Patient Name: Brooke [name] MRN: [id number] DOB:1934/06/11, 89 y.o., female Today's Date: 12/04/2023  END OF SESSION:  PT End of Session - 12/04/23 1535     Visit Number 5    Date for PT Re-Evaluation 01/31/24    Authorization Type HTA    Progress Note Due on Visit 10    PT Start Time 1530    PT Stop Time 1610    PT Time Calculation (min) 40 min    Activity Tolerance Patient tolerated treatment well    Behavior During Therapy [facility] for tasks assessed/performed           Past Medical History:  Diagnosis Date   Aortic insufficiency    mild by cMRI 11/2021   CAD (coronary artery disease), native coronary artery    coronary Ca score 153 with mild calcified plaque in the proximal LAD 25-49% by coronary CTA 08/2020   Chest pain    Chronic kidney disease    Diverticulitis    HTN (hypertension)    IBS (irritable bowel syndrome)    Osteopenia    Pulmonic regurgitation    moderate by echo 05/2023   Past Surgical History:  Procedure Laterality Date   CARDIAC CATHETERIZATION     CHOLECYSTECTOMY N/A 12/15/2021   Procedure: LAPAROSCOPIC CHOLECYSTECTOMY;  Surgeon: Aron Shoulders, MD;  Location: [facility] OR;  Service: General;  Laterality: N/A;   DILATION AND CURETTAGE OF UTERUS     Patient Active Problem List   Diagnosis Date Noted   HOCM (hypertrophic obstructive cardiomyopathy) (HCC) 09/04/2023   Pulmonic regurgitation    Chronic calculous cholecystitis 12/15/2021   CAD (coronary artery disease), native coronary artery    Hypertensive heart disease 06/08/2016   Aortic insufficiency 03/22/2015   Essential hypertension 03/22/2015   LVH (left ventricular hypertrophy) 03/22/2015   Abnormal PFT 09/17/2014   SOB (shortness of breath) 09/17/2014   DOE (dyspnea on exertion) 09/08/2014    PCP: Verena Mems, MD  REFERRING PROVIDER: Verena Mems, MD  REFERRING DIAG: N81.9 (ICD-10-CM) - Female genital prolapse,  unspecified  THERAPY DIAG:  Muscle weakness (generalized)  Unspecified lack of coordination  Other muscle spasm  Other low back pain  Pelvic pain  Abnormal posture  Rationale for Evaluation and Treatment: Rehabilitation  ONSET DATE: 02/16/2023  SUBJECTIVE:                                                                                                                                                                                           SUBJECTIVE STATEMENT: Pt states that she feels like there has been a little progress in  the last week. She has improved constipation which is helping. She tried inverted lying to help empty bladder and this allowed her to get small amount of urine out to give relief. Her improved bowel movements are likely due to benefiber.   PAIN: 11/27/23 Are you having pain? Yes NPRS scale: 0/10 Pain location: low abdomen over bladder; low back pain  Pain type: urgency Pain description: intermittent   Aggravating factors: having to urinate or have bowel movement  Relieving factors: being able to relieve herself  PRECAUTIONS: None  RED FLAGS: None   WEIGHT BEARING RESTRICTIONS: No  FALLS:  Has patient fallen in last 6 months? No  OCCUPATION: teacher  ACTIVITY LEVEL : walking  PLOF: Independent  PATIENT GOALS: be able to empty bladder better; empty bowels better; prevent things from getting worse  PERTINENT HISTORY:  Coronary artery disease, aortic insufficiency, chest pain, chronic kidney disease, IBS, osteopenia, diverticulitis Sexual abuse: No  BOWEL MOVEMENT: Pain with bowel movement: Yes Type of bowel movement:Type (Bristol Stool Scale) 1-7, Frequency 6x/day, Strain sometimes, and Splinting she will press in abdomen, but not perineum Fully empty rectum: No - feels like she has t ogo all day long Leakage: No Pads: Yes: see below Fiber supplement/laxative none  URINATION: Pain with urination: Yes Fully empty bladder: No Stream:  hard to start, starts and stops - has to spread legs and bend way over to restart Urgency: Yes  Frequency: unsure during the day because it is so hard for urine to come out; she can always urinate at night and in the morning; nocturia 2-3x/night Fluid Intake:  Leakage: Urge to void, Walking to the bathroom, and standing up Pads: Yes: 4-5 a day  INTERCOURSE: Not sexually active, no history of pain  PREGNANCY: Vaginal deliveries 4 Tearing Yes: - Episiotomy No C-section deliveries 0 Currently pregnant No  PROLAPSE: Pressure and Bulge   OBJECTIVE:  Note: Objective measures were completed at Evaluation unless otherwise noted.  11/08/23 PATIENT SURVEYS:   PFIQ-7: 62  COGNITION: Overall cognitive status: Within functional limits for tasks assessed     SENSATION: Light touch: Appears intact   FUNCTIONAL TESTS:  Squat: bil valgus knee collapse Single leg stance:  Rt: pelvic drop  Lt: pelvic drop Curl-up test: large distortion    GAIT: Assistive device utilized: None Comments: decreased bil hip extension  POSTURE: rounded shoulders, forward head, decreased lumbar lordosis, increased thoracic kyphosis, posterior pelvic tilt, and significant Rt iliac crest elevation   LUMBARAROM/PROM:  A/PROM A/PROM  Eval (% available)  Flexion 50  Extension 10  Right lateral flexion 50  Left lateral flexion 50  Right rotation 75  Left rotation 75   (Blank rows = not tested)  PALPATION:   General: tightness throughout bil lumbar paraspinals and glutes  Pelvic Alignment: Rt iliac crest elevation  Abdominal: increased sternocostal angle; tenderness in Lt lower quadrant                External Perineal Exam: presence of Grade 4 uterine ligament laxity; dry tissue                             Internal Pelvic Floor: low tone, no tenderness  Patient confirms identification and approves PT to assess internal pelvic floor and treatment Yes  PELVIC MMT:   MMT eval  Vaginal 1/5,  3 second hold, 10 repeat contractions  Diastasis Recti 3 finger widths separation  (Blank rows = not tested)  TONE: low  PROLAPSE: Grade 4 uterine prolapse tested in supine   TODAY'S TREATMENT:                                                                                                                              DATE:  12/04/23 Neuromuscular re-education: Bridge with hip adduction, transversus abdominus, and pelvic floor muscle 2 x 10 Supine resisted march 10x bil Supine bridge with hip abduction red band 2 x 10 Exercises: Lower trunk rotation 2 x 10 Single knee to chest 10x bil Therapeutic activities: Squat + pelvic floor muscle + transversus abdominus 2 x 10 to table  Pallof press green band 2 x 10 bil Standing 3 way kick 10x each, bil  Standing heel raises 2 x 10 Standing bil shoulder extensions with march red band 2 x 10   11/26/23 Neuromuscular re-education: Standing march shoulder flexion at 90 degrees 2lbs 2 x 10 Standing side bend 5 lbs 12x bil Bridge with transversus abdominus, and pelvic floor muscle 10x Therapeutic activities: Pt education: expectations for progress and goals of treatment: symptom reduction and getting stronger in order to be able to have pessary placed  Prior to standing up, perform hip adduction + pelvic floor muscle contraction, thigh press + pelvic floor muscle contraction; once standing perform 10 heel raises  If she cannot empty bladder, go lie down and perform 10 bridges to help reposition and allow better emptying of bladder Squat + pelvic floor muscle + transversus abdominus + hip adduction 2 x 10 Unilateral shoulder extension in standing green band + pelvic floor muscle + transversus abdominus 2 x 10 bil Pallof press green band 2 x 10 bil Standing 3 way kick 10x each, bil    11/22/23 Manual: Supine bowel mobilization ILU massage External bladder mobilization and lift Neuromuscular re-education: Bridge with hip adduction,  transversus abdominus, and pelvic floor muscle 2 x 10 Supine leg extensions 10x bil Squat + pelvic floor muscle contraction + adduction 2 x 10 to table Exercises: Side lying clam shells 2 x 10 bil Supine piriformis stretch 60 sec bil    PATIENT EDUCATION:  Education details: See above Person educated: Patient Education method: Explanation, Demonstration, Tactile cues, Verbal cues, and Handouts Education comprehension: verbalized understanding  HOME EXERCISE PROGRAM: [id number]  ASSESSMENT:  CLINICAL IMPRESSION: Patient is a 89 y.o. female who was seen today for physical therapy evaluation and treatment for prolapse, incomplete bowel/bladder emptying, and urinary incontinence. Pt seeing more progress over the last week; she believes this may be due to getting constipation under control with starting Metamucil. She was able to use inverted lying to help empty bladder better. She has not been able to work on as many exercises over the last week. She did well with all exercises today, working on more sustained pelvic floor muscle and core contractions with breathing throughout activities. She initially had difficulty with alternating march with shoulder extension but made great improvements in balance as she  went. She will continue to benefit from skilled PT intervention in order to improve pressure management, improve bladder and bowel control, increase pelvic floor muscle and core strength, improve quality of life, and progress strengthening program.   OBJECTIVE IMPAIRMENTS: decreased activity tolerance, decreased coordination, decreased endurance, decreased mobility, decreased ROM, decreased strength, increased fascial restrictions, increased muscle spasms, impaired flexibility, impaired tone, improper body mechanics, postural dysfunction, and pain.   ACTIVITY LIMITATIONS: bending, standing, squatting, transfers, bed mobility, and continence  PARTICIPATION LIMITATIONS: cleaning, interpersonal  relationship, community activity, and care taking  PERSONAL FACTORS: 3+ comorbidities: medical history are also affecting patient's functional outcome.   REHAB POTENTIAL: Good  CLINICAL DECISION MAKING: Evolving/moderate complexity  EVALUATION COMPLEXITY: Moderate   GOALS: Goals reviewed with patient? Yes  SHORT TERM GOALS: Target date: 12/06/2023   Pt will be independent with HEP.   Baseline: Goal status: INITIAL  2.  Pt will be independent with applying vaginal moisturizer to vulva, introitus, and any prolapsed tissue in order to decrease discomfort and improve tissue integrity.  Baseline:  Goal status: INITIAL  3.  Pt will be independent with reducing any prolapsed tissue back into vaginal canal with moisturizer in order to decrease discomfort and help more completely empty bladder to reduce risk of infection.  Baseline:  Goal status: INITIAL  4.  Pt will be independent with urge drill and double voiding in order to improve ability to get to the bathroom and completely empty bladder.  Baseline:  Goal status: INITIAL  5.  Pt will be able to correctly perform diaphragmatic breathing and appropriate pressure management in order to prevent worsening vaginal wall/uterine ligament laxity and improve pelvic floor A/ROM.   Baseline:  Goal status: INITIAL  6.  Pt will improve pelvic floor muscle strength to 2/5 in order to decrease urinary incontinence and pad use to 3 pads a day.  Baseline:  Goal status: INITIAL  LONG TERM GOALS: Target date: 01/31/24  Pt will be independent with advanced HEP.   Baseline:  Goal status: INITIAL  2.  Pt will report complete bladder emptying in order to reduce risk for infection.  Baseline:  Goal status: INITIAL  3.  Pt will be able to go 2-3 hours in between voids without urgency or incontinence in order to improve QOL and perform all functional activities with less difficulty.   Baseline:  Goal status: INITIAL  4.  Pt will report  no episodes of incontinence with sit<>stand transition.  Baseline:  Goal status: INITIAL  5.  Pt will increase pelvic floor muscle strength to 3/5 in order to reduce pad use to no more than 2 a day. Baseline:  Goal status: INITIAL  6.  Patient will increase pelvic floor muscle strength to 3/5 in order to re Baseline:  Goal status: INITIAL  PLAN:  PT FREQUENCY: 1-2x/week  PT DURATION: 12 weeks   PLANNED INTERVENTIONS: 97110-Therapeutic exercises, 97530- Therapeutic activity, [id number]- Neuromuscular re-education, 97535- Self Care, [id number]- Manual therapy, 20560 (1-2 muscles), 20561 (3+ muscles), 02886- Aquatic Therapy, and Biofeedback  PLAN FOR NEXT SESSION: core training; pressure management; inverted activities  Josette Mares, PT, DPT08/19/254:07 PM

## 2023-12-06 ENCOUNTER — Telehealth: Payer: Self-pay | Admitting: Pharmacist

## 2023-12-06 NOTE — Telephone Encounter (Signed)
 Patient called and LVM on my machine that this week she has been SOB, chest tightness and dizziness when she stands up. Taking Camzyos  2.5mg . Wanted to make Dr. JAYSON aware.

## 2023-12-07 NOTE — Telephone Encounter (Signed)
 Patient called again. SOB/chest tightness on exertion (ie walking to get trashcan and back). This had gone away when she started camzyos  but since the last week has come back. However, it is not as bad as before she started Camzyos . She also reports feeling a little dizzy when she first gets up (if she has been sitting for awhile). Stands up slowly, goes away fast.  BP mostly in the 130's.  Advised that I do not think that there is any urgent need for assessment.  Camzyos  adjusted based off of gradient.  I will certainly get this information over to Dr. Arnetha to see if any changes are needed.

## 2023-12-12 ENCOUNTER — Other Ambulatory Visit (HOSPITAL_COMMUNITY)

## 2023-12-12 ENCOUNTER — Ambulatory Visit: Payer: Self-pay

## 2023-12-12 DIAGNOSIS — R293 Abnormal posture: Secondary | ICD-10-CM

## 2023-12-12 DIAGNOSIS — M6281 Muscle weakness (generalized): Secondary | ICD-10-CM | POA: Diagnosis not present

## 2023-12-12 DIAGNOSIS — R279 Unspecified lack of coordination: Secondary | ICD-10-CM

## 2023-12-12 DIAGNOSIS — R102 Pelvic and perineal pain: Secondary | ICD-10-CM

## 2023-12-12 DIAGNOSIS — M5459 Other low back pain: Secondary | ICD-10-CM

## 2023-12-12 DIAGNOSIS — M62838 Other muscle spasm: Secondary | ICD-10-CM

## 2023-12-12 NOTE — Therapy (Signed)
 OUTPATIENT PHYSICAL THERAPY FEMALE PELVIC TREATMENT   Patient Name: Brooke [name] MRN: [id number] DOB:Dec 20, 1934, 89 y.o., female Today's Date: 12/12/2023  END OF SESSION:  PT End of Session - 12/12/23 0934     Visit Number 6    Date for PT Re-Evaluation 01/31/24    Authorization Type HTA    Progress Note Due on Visit 10    PT Start Time 0930    PT Stop Time 1011    PT Time Calculation (min) 41 min    Activity Tolerance Patient tolerated treatment well    Behavior During Therapy [facility] for tasks assessed/performed           Past Medical History:  Diagnosis Date   Aortic insufficiency    mild by cMRI 11/2021   CAD (coronary artery disease), native coronary artery    coronary Ca score 153 with mild calcified plaque in the proximal LAD 25-49% by coronary CTA 08/2020   Chest pain    Chronic kidney disease    Diverticulitis    HTN (hypertension)    IBS (irritable bowel syndrome)    Osteopenia    Pulmonic regurgitation    moderate by echo 05/2023   Past Surgical History:  Procedure Laterality Date   CARDIAC CATHETERIZATION     CHOLECYSTECTOMY N/A 12/15/2021   Procedure: LAPAROSCOPIC CHOLECYSTECTOMY;  Surgeon: Aron Shoulders, MD;  Location: [facility] OR;  Service: General;  Laterality: N/A;   DILATION AND CURETTAGE OF UTERUS     Patient Active Problem List   Diagnosis Date Noted   HOCM (hypertrophic obstructive cardiomyopathy) (HCC) 09/04/2023   Pulmonic regurgitation    Chronic calculous cholecystitis 12/15/2021   CAD (coronary artery disease), native coronary artery    Hypertensive heart disease 06/08/2016   Aortic insufficiency 03/22/2015   Essential hypertension 03/22/2015   LVH (left ventricular hypertrophy) 03/22/2015   Abnormal PFT 09/17/2014   SOB (shortness of breath) 09/17/2014   DOE (dyspnea on exertion) 09/08/2014    PCP: Verena Mems, MD  REFERRING PROVIDER: Verena Mems, MD  REFERRING DIAG: N81.9 (ICD-10-CM) - Female genital prolapse,  unspecified  THERAPY DIAG:  Muscle weakness (generalized)  Unspecified lack of coordination  Other muscle spasm  Other low back pain  Pelvic pain  Abnormal posture  Rationale for Evaluation and Treatment: Rehabilitation  ONSET DATE: 02/16/2023  SUBJECTIVE:                                                                                                                                                                                           SUBJECTIVE STATEMENT: Pt states that she has some days that are much better and some  days that are worse again.   PAIN: 12/12/23 Are you having pain? Yes NPRS scale: 0/10 Pain location: low abdomen over bladder; low back pain  Pain type: urgency Pain description: intermittent   Aggravating factors: having to urinate or have bowel movement  Relieving factors: being able to relieve herself  PRECAUTIONS: None  RED FLAGS: None   WEIGHT BEARING RESTRICTIONS: No  FALLS:  Has patient fallen in last 6 months? No  OCCUPATION: teacher  ACTIVITY LEVEL : walking  PLOF: Independent  PATIENT GOALS: be able to empty bladder better; empty bowels better; prevent things from getting worse  PERTINENT HISTORY:  Coronary artery disease, aortic insufficiency, chest pain, chronic kidney disease, IBS, osteopenia, diverticulitis Sexual abuse: No  BOWEL MOVEMENT: Pain with bowel movement: Yes Type of bowel movement:Type (Bristol Stool Scale) 1-7, Frequency 6x/day, Strain sometimes, and Splinting she will press in abdomen, but not perineum Fully empty rectum: No - feels like she has t ogo all day long Leakage: No Pads: Yes: see below Fiber supplement/laxative none  URINATION: Pain with urination: Yes Fully empty bladder: No Stream: hard to start, starts and stops - has to spread legs and bend way over to restart Urgency: Yes  Frequency: unsure during the day because it is so hard for urine to come out; she can always urinate at night and in  the morning; nocturia 2-3x/night Fluid Intake:  Leakage: Urge to void, Walking to the bathroom, and standing up Pads: Yes: 4-5 a day  INTERCOURSE: Not sexually active, no history of pain  PREGNANCY: Vaginal deliveries 4 Tearing Yes: - Episiotomy No C-section deliveries 0 Currently pregnant No  PROLAPSE: Pressure and Bulge   OBJECTIVE:  Note: Objective measures were completed at Evaluation unless otherwise noted.  11/08/23 PATIENT SURVEYS:   PFIQ-7: 62  COGNITION: Overall cognitive status: Within functional limits for tasks assessed     SENSATION: Light touch: Appears intact   FUNCTIONAL TESTS:  Squat: bil valgus knee collapse Single leg stance:  Rt: pelvic drop  Lt: pelvic drop Curl-up test: large distortion    GAIT: Assistive device utilized: None Comments: decreased bil hip extension  POSTURE: rounded shoulders, forward head, decreased lumbar lordosis, increased thoracic kyphosis, posterior pelvic tilt, and significant Rt iliac crest elevation   LUMBARAROM/PROM:  A/PROM A/PROM  Eval (% available)  Flexion 50  Extension 10  Right lateral flexion 50  Left lateral flexion 50  Right rotation 75  Left rotation 75   (Blank rows = not tested)  PALPATION:   General: tightness throughout bil lumbar paraspinals and glutes  Pelvic Alignment: Rt iliac crest elevation  Abdominal: increased sternocostal angle; tenderness in Lt lower quadrant                External Perineal Exam: presence of Grade 4 uterine ligament laxity; dry tissue                             Internal Pelvic Floor: low tone, no tenderness  Patient confirms identification and approves PT to assess internal pelvic floor and treatment Yes  PELVIC MMT:   MMT eval  Vaginal 1/5, 3 second hold, 10 repeat contractions  Diastasis Recti 3 finger widths separation  (Blank rows = not tested)        TONE: low  PROLAPSE: Grade 4 uterine prolapse tested in supine   TODAY'S TREATMENT:  DATE:  12/12/23 Neuromuscular re-education: Bridge with hip adduction, transversus abdominus, and pelvic floor muscle 2 x 10 Supine hip abduction + red band 2 x 10 Bridge with hip abduction + red band 2 x 10 Bridge with march 2 x 10 Supine full shoulder flexion with 3 lb weight 2 x 10 Horizontal abduction with bridge 2 x 10 green band    12/04/23 Neuromuscular re-education: Bridge with hip adduction, transversus abdominus, and pelvic floor muscle 2 x 10 Supine resisted march 10x bil Supine bridge with hip abduction red band 2 x 10 Exercises: Lower trunk rotation 2 x 10 Single knee to chest 10x bil Therapeutic activities: Squat + pelvic floor muscle + transversus abdominus 2 x 10 to table  Pallof press green band 2 x 10 bil Standing 3 way kick 10x each, bil  Standing heel raises 2 x 10 Standing bil shoulder extensions with march red band 2 x 10   11/26/23 Neuromuscular re-education: Standing march shoulder flexion at 90 degrees 2lbs 2 x 10 Standing side bend 5 lbs 12x bil Bridge with transversus abdominus, and pelvic floor muscle 10x Therapeutic activities: Pt education: expectations for progress and goals of treatment: symptom reduction and getting stronger in order to be able to have pessary placed  Prior to standing up, perform hip adduction + pelvic floor muscle contraction, thigh press + pelvic floor muscle contraction; once standing perform 10 heel raises  If she cannot empty bladder, go lie down and perform 10 bridges to help reposition and allow better emptying of bladder Squat + pelvic floor muscle + transversus abdominus + hip adduction 2 x 10 Unilateral shoulder extension in standing green band + pelvic floor muscle + transversus abdominus 2 x 10 bil Pallof press green band 2 x 10 bil Standing 3 way kick 10x each, bil     PATIENT EDUCATION:   Education details: See above Person educated: Patient Education method: Explanation, Demonstration, Tactile cues, Verbal cues, and Handouts Education comprehension: verbalized understanding  HOME EXERCISE PROGRAM: [id number]  ASSESSMENT:  CLINICAL IMPRESSION: Patient is a 89 y.o. female who was seen today for physical therapy evaluation and treatment for prolapse, incomplete bowel/bladder emptying, and urinary incontinence. Pt possibly seeing some progress with improved bladder emptying and incontinence some days, but has days in which it is much more difficult and she has more pain/incontinence. We are continuing strengthening, focusing more in bridge positions and positions that focus on using gravity to our advantage with prolapse reduction. She tolerated all exercises very well today. She will continue to benefit from skilled PT intervention in order to improve pressure management, improve bladder and bowel control, increase pelvic floor muscle and core strength, improve quality of life, and progress strengthening program.   OBJECTIVE IMPAIRMENTS: decreased activity tolerance, decreased coordination, decreased endurance, decreased mobility, decreased ROM, decreased strength, increased fascial restrictions, increased muscle spasms, impaired flexibility, impaired tone, improper body mechanics, postural dysfunction, and pain.   ACTIVITY LIMITATIONS: bending, standing, squatting, transfers, bed mobility, and continence  PARTICIPATION LIMITATIONS: cleaning, interpersonal relationship, community activity, and care taking  PERSONAL FACTORS: 3+ comorbidities: medical history are also affecting patient's functional outcome.   REHAB POTENTIAL: Good  CLINICAL DECISION MAKING: Evolving/moderate complexity  EVALUATION COMPLEXITY: Moderate   GOALS: Goals reviewed with patient? Yes  SHORT TERM GOALS: Updated 12/12/23   Pt will be independent with HEP.   Baseline: Goal status: MET  12/12/23  2.  Pt will be independent with applying vaginal moisturizer to vulva, introitus, and any prolapsed tissue in  order to decrease discomfort and improve tissue integrity.  Baseline:  Goal status: MET 12/12/23  3.  Pt will be independent with reducing any prolapsed tissue back into vaginal canal with moisturizer in order to decrease discomfort and help more completely empty bladder to reduce risk of infection.  Baseline:  Goal status: MET 12/12/23  4.  Pt will be independent with urge drill and double voiding in order to improve ability to get to the bathroom and completely empty bladder.  Baseline:  Goal status: MET 12/12/23  5.  Pt will be able to correctly perform diaphragmatic breathing and appropriate pressure management in order to prevent worsening vaginal wall/uterine ligament laxity and improve pelvic floor A/ROM.   Baseline:  Goal status: IN PROGRESS 12/12/23  6.  Pt will improve pelvic floor muscle strength to 2/5 in order to decrease urinary incontinence and pad use to 3 pads a day.  Baseline:  Goal status: IN PROGRESS 12/12/23  LONG TERM GOALS: Updated 12/12/23  Pt will be independent with advanced HEP.   Baseline:  Goal status: IN PROGRESS 12/12/23  2.  Pt will report complete bladder emptying in order to reduce risk for infection.  Baseline:  Goal status: IN PROGRESS 12/12/23  3.  Pt will be able to go 2-3 hours in between voids without urgency or incontinence in order to improve QOL and perform all functional activities with less difficulty.   Baseline:  Goal status: IN PROGRESS 12/12/23  4.  Pt will report no episodes of incontinence with sit<>stand transition.  Baseline:  Goal status: IN PROGRESS 12/12/23  5.  Pt will increase pelvic floor muscle strength to 3/5 in order to reduce pad use to no more than 2 a day. Baseline:  Goal status: IN PROGRESS 12/12/23  6.  Patient will increase pelvic floor muscle strength to 3/5 in order to re Baseline:  Goal  status: IN PROGRESS 12/12/23  PLAN:  PT FREQUENCY: 1-2x/week  PT DURATION: 12 weeks   PLANNED INTERVENTIONS: 97110-Therapeutic exercises, 97530- Therapeutic activity, 97112- Neuromuscular re-education, 97535- Self Care, 02859- Manual therapy, 20560 (1-2 muscles), 20561 (3+ muscles), 02886- Aquatic Therapy, and Biofeedback  PLAN FOR NEXT SESSION: core training; pressure management; inverted activities  Josette Mares, PT, DPT08/27/2510:09 AM

## 2023-12-16 DIAGNOSIS — E782 Mixed hyperlipidemia: Secondary | ICD-10-CM | POA: Diagnosis not present

## 2023-12-16 DIAGNOSIS — E039 Hypothyroidism, unspecified: Secondary | ICD-10-CM | POA: Diagnosis not present

## 2023-12-16 DIAGNOSIS — I10 Essential (primary) hypertension: Secondary | ICD-10-CM | POA: Diagnosis not present

## 2023-12-16 DIAGNOSIS — F330 Major depressive disorder, recurrent, mild: Secondary | ICD-10-CM | POA: Diagnosis not present

## 2023-12-18 ENCOUNTER — Ambulatory Visit (HOSPITAL_COMMUNITY)
Admission: RE | Admit: 2023-12-18 | Discharge: 2023-12-18 | Disposition: A | Discharge status: Home / Self Care | Source: Ambulatory Visit | Attending: Cardiology | Admitting: Cardiology

## 2023-12-18 DIAGNOSIS — I421 Obstructive hypertrophic cardiomyopathy: Secondary | ICD-10-CM | POA: Diagnosis not present

## 2023-12-18 LAB — ECHOCARDIOGRAM LIMITED
AR max vel: 2.69 cm2
AV Area VTI: 2.8 cm2
AV Area mean vel: 2.63 cm2
AV Mean grad: 8.5 mmHg
AV Peak grad: 15.2 mmHg
Ao pk vel: 1.95 m/s
Area-P 1/2: 3.5 cm2
MV VTI: 3.11 cm2
P 1/2 time: 480 ms
S' Lateral: 2.1 cm

## 2023-12-20 DIAGNOSIS — N819 Female genital prolapse, unspecified: Secondary | ICD-10-CM | POA: Diagnosis not present

## 2023-12-20 DIAGNOSIS — R3911 Hesitancy of micturition: Secondary | ICD-10-CM | POA: Diagnosis not present

## 2023-12-24 ENCOUNTER — Telehealth: Payer: Self-pay | Admitting: Pharmacist

## 2023-12-24 DIAGNOSIS — I421 Obstructive hypertrophic cardiomyopathy: Secondary | ICD-10-CM

## 2023-12-24 NOTE — Telephone Encounter (Signed)
 Patient called and LVM on my office VM. States she was returning message that she received on Mychart about her Camzyos  dose.

## 2023-12-25 ENCOUNTER — Ambulatory Visit: Attending: Family Medicine

## 2023-12-25 DIAGNOSIS — M62838 Other muscle spasm: Secondary | ICD-10-CM | POA: Diagnosis not present

## 2023-12-25 DIAGNOSIS — M5459 Other low back pain: Secondary | ICD-10-CM | POA: Diagnosis not present

## 2023-12-25 DIAGNOSIS — M6281 Muscle weakness (generalized): Secondary | ICD-10-CM | POA: Diagnosis not present

## 2023-12-25 DIAGNOSIS — R102 Pelvic and perineal pain: Secondary | ICD-10-CM | POA: Insufficient documentation

## 2023-12-25 DIAGNOSIS — R293 Abnormal posture: Secondary | ICD-10-CM | POA: Diagnosis not present

## 2023-12-25 DIAGNOSIS — R279 Unspecified lack of coordination: Secondary | ICD-10-CM | POA: Diagnosis not present

## 2023-12-25 NOTE — Telephone Encounter (Signed)
 Im not sure if it was an old call, maybe after her last echo? I'm not sure. She has my direct number from when we were setting up coverage of Camzyos , so now she just calls me for everything

## 2023-12-25 NOTE — Therapy (Signed)
 OUTPATIENT PHYSICAL THERAPY FEMALE PELVIC TREATMENT   Patient Name: Brooke [name] MRN: [id number] DOB:Jan 05, 1935, 89 y.o., female Today's Date: 12/25/2023  END OF SESSION:  PT End of Session - 12/25/23 1447     Visit Number 7    Date for PT Re-Evaluation 01/31/24    Authorization Type HTA    Progress Note Due on Visit 10    PT Start Time 1448    PT Stop Time 1527    PT Time Calculation (min) 39 min    Activity Tolerance Patient tolerated treatment well    Behavior During Therapy [facility] for tasks assessed/performed           Past Medical History:  Diagnosis Date   Aortic insufficiency    mild by cMRI 11/2021   CAD (coronary artery disease), native coronary artery    coronary Ca score 153 with mild calcified plaque in the proximal LAD 25-49% by coronary CTA 08/2020   Chest pain    Chronic kidney disease    Diverticulitis    HTN (hypertension)    IBS (irritable bowel syndrome)    Osteopenia    Pulmonic regurgitation    moderate by echo 05/2023   Past Surgical History:  Procedure Laterality Date   CARDIAC CATHETERIZATION     CHOLECYSTECTOMY N/A 12/15/2021   Procedure: LAPAROSCOPIC CHOLECYSTECTOMY;  Surgeon: Aron Shoulders, MD;  Location: [facility] OR;  Service: General;  Laterality: N/A;   DILATION AND CURETTAGE OF UTERUS     Patient Active Problem List   Diagnosis Date Noted   HOCM (hypertrophic obstructive cardiomyopathy) (HCC) 09/04/2023   Pulmonic regurgitation    Chronic calculous cholecystitis 12/15/2021   CAD (coronary artery disease), native coronary artery    Hypertensive heart disease 06/08/2016   Aortic insufficiency 03/22/2015   Essential hypertension 03/22/2015   LVH (left ventricular hypertrophy) 03/22/2015   Abnormal PFT 09/17/2014   SOB (shortness of breath) 09/17/2014   DOE (dyspnea on exertion) 09/08/2014    PCP: Verena Mems, MD  REFERRING PROVIDER: Verena Mems, MD  REFERRING DIAG: N81.9 (ICD-10-CM) - Female genital prolapse,  unspecified  THERAPY DIAG:  Muscle weakness (generalized)  Unspecified lack of coordination  Other muscle spasm  Other low back pain  Pelvic pain  Abnormal posture  Rationale for Evaluation and Treatment: Rehabilitation  ONSET DATE: 02/16/2023  SUBJECTIVE:                                                                                                                                                                                           SUBJECTIVE STATEMENT: Pt states that things are getting worse. She has not urinated since this  morning (250 in the afternoon). She will be in pain by the night and then she will have large accidents throughout the night.She was having pain, but just took a tylenol  and states that she is feeling much better.   PAIN: 12/12/23 Are you having pain? Yes NPRS scale: 0/10 Pain location: low abdomen over bladder; low back pain  Pain type: urgency Pain description: intermittent   Aggravating factors: having to urinate or have bowel movement  Relieving factors: being able to relieve herself  PRECAUTIONS: None  RED FLAGS: None   WEIGHT BEARING RESTRICTIONS: No  FALLS:  Has patient fallen in last 6 months? No  OCCUPATION: teacher  ACTIVITY LEVEL : walking  PLOF: Independent  PATIENT GOALS: be able to empty bladder better; empty bowels better; prevent things from getting worse  PERTINENT HISTORY:  Coronary artery disease, aortic insufficiency, chest pain, chronic kidney disease, IBS, osteopenia, diverticulitis Sexual abuse: No  BOWEL MOVEMENT: Pain with bowel movement: Yes Type of bowel movement:Type (Bristol Stool Scale) 1-7, Frequency 6x/day, Strain sometimes, and Splinting she will press in abdomen, but not perineum Fully empty rectum: No - feels like she has t ogo all day long Leakage: No Pads: Yes: see below Fiber supplement/laxative none  URINATION: Pain with urination: Yes Fully empty bladder: No Stream: hard to start,  starts and stops - has to spread legs and bend way over to restart Urgency: Yes  Frequency: unsure during the day because it is so hard for urine to come out; she can always urinate at night and in the morning; nocturia 2-3x/night Fluid Intake:  Leakage: Urge to void, Walking to the bathroom, and standing up Pads: Yes: 4-5 a day  INTERCOURSE: Not sexually active, no history of pain  PREGNANCY: Vaginal deliveries 4 Tearing Yes: - Episiotomy No C-section deliveries 0 Currently pregnant No  PROLAPSE: Pressure and Bulge   OBJECTIVE:  Note: Objective measures were completed at Evaluation unless otherwise noted.  11/08/23 PATIENT SURVEYS:   PFIQ-7: 62  COGNITION: Overall cognitive status: Within functional limits for tasks assessed     SENSATION: Light touch: Appears intact   FUNCTIONAL TESTS:  Squat: bil valgus knee collapse Single leg stance:  Rt: pelvic drop  Lt: pelvic drop Curl-up test: large distortion    GAIT: Assistive device utilized: None Comments: decreased bil hip extension  POSTURE: rounded shoulders, forward head, decreased lumbar lordosis, increased thoracic kyphosis, posterior pelvic tilt, and significant Rt iliac crest elevation   LUMBARAROM/PROM:  A/PROM A/PROM  Eval (% available)  Flexion 50  Extension 10  Right lateral flexion 50  Left lateral flexion 50  Right rotation 75  Left rotation 75   (Blank rows = not tested)  PALPATION:   General: tightness throughout bil lumbar paraspinals and glutes  Pelvic Alignment: Rt iliac crest elevation  Abdominal: increased sternocostal angle; tenderness in Lt lower quadrant                External Perineal Exam: presence of Grade 4 uterine ligament laxity; dry tissue                             Internal Pelvic Floor: low tone, no tenderness  Patient confirms identification and approves PT to assess internal pelvic floor and treatment Yes  PELVIC MMT:   MMT eval  Vaginal 1/5, 3 second hold,  10 repeat contractions  Diastasis Recti 3 finger widths separation  (Blank rows = not tested)  TONE: low  PROLAPSE: Grade 4 uterine prolapse tested in supine   TODAY'S TREATMENT:                                                                                                                              DATE:  12/25/23 Manual: Supine lower abdominal mobilization and bladder mobilization Neuromuscular re-education: Bridge + pelvic floor muscle contraction 2 x 10 Supine hip abduction + red band + pelvic floor muscle contraction 2 x 10 Supine unilateral bent knee fall out + red band 10x bil Supine march + red band 2 x 10 Therapeutic activities: Inverted lying: perform throughout the day (probably unnecessary in the morning) to help with bladder emptying Using clean hand and coconut oil, discussed how to gently pressure uterus back into vaginal canal, especially when she cannot empty bladder Self-lower abdominal massage for better relaxation   12/12/23 Neuromuscular re-education: Bridge with hip adduction, transversus abdominus, and pelvic floor muscle 2 x 10 Supine hip abduction + red band 2 x 10 Bridge with hip abduction + red band 2 x 10 Bridge with march 2 x 10 Supine full shoulder flexion with 3 lb weight 2 x 10 Horizontal abduction with bridge 2 x 10 green band    12/04/23 Neuromuscular re-education: Bridge with hip adduction, transversus abdominus, and pelvic floor muscle 2 x 10 Supine resisted march 10x bil Supine bridge with hip abduction red band 2 x 10 Exercises: Lower trunk rotation 2 x 10 Single knee to chest 10x bil Therapeutic activities: Squat + pelvic floor muscle + transversus abdominus 2 x 10 to table  Pallof press green band 2 x 10 bil Standing 3 way kick 10x each, bil  Standing heel raises 2 x 10 Standing bil shoulder extensions with march red band 2 x 10      PATIENT EDUCATION:  Education details: See above Person educated:  Patient Education method: Explanation, Demonstration, Tactile cues, Verbal cues, and Handouts Education comprehension: verbalized understanding  HOME EXERCISE PROGRAM: [id number]  ASSESSMENT:  CLINICAL IMPRESSION: Patient is a 89 y.o. female who was seen today for physical therapy evaluation and treatment for prolapse, incomplete bowel/bladder emptying, and urinary incontinence. Pt having very difficult time with urinary retention and then having large voids outside of her control while sleeping. She is in a lot of discomfort due to this, but tylenol  has still been helpful. Messaged [facility] to see if they had received referral for evaluation, but they have not. Pt was encouraged to call [facility] and ask for referral to [facility] to be resent so she can get evaluated.  Manual techniques performed today for improved lower abdominal relaxation and bladder relaxation. She did not have sensation to urinate throughout session, but hoping this will help with voiding the rest of the afternoon. We discussed performed lower abdominal massage freqeuntly to help with voiding and spending more time throughout the day in inverted lying position to help reposition/decompress uterus so bladder can  empty. She will continue to benefit from skilled PT intervention in order to improve pressure management, improve bladder and bowel control, increase pelvic floor muscle and core strength, improve quality of life, and progress strengthening program.   OBJECTIVE IMPAIRMENTS: decreased activity tolerance, decreased coordination, decreased endurance, decreased mobility, decreased ROM, decreased strength, increased fascial restrictions, increased muscle spasms, impaired flexibility, impaired tone, improper body mechanics, postural dysfunction, and pain.   ACTIVITY LIMITATIONS: bending, standing, squatting, transfers, bed mobility, and continence  PARTICIPATION LIMITATIONS: cleaning, interpersonal relationship,  community activity, and care taking  PERSONAL FACTORS: 3+ comorbidities: medical history are also affecting patient's functional outcome.   REHAB POTENTIAL: Good  CLINICAL DECISION MAKING: Evolving/moderate complexity  EVALUATION COMPLEXITY: Moderate   GOALS: Goals reviewed with patient? Yes  SHORT TERM GOALS: Updated 12/12/23   Pt will be independent with HEP.   Baseline: Goal status: MET 12/12/23  2.  Pt will be independent with applying vaginal moisturizer to vulva, introitus, and any prolapsed tissue in order to decrease discomfort and improve tissue integrity.  Baseline:  Goal status: MET 12/12/23  3.  Pt will be independent with reducing any prolapsed tissue back into vaginal canal with moisturizer in order to decrease discomfort and help more completely empty bladder to reduce risk of infection.  Baseline:  Goal status: MET 12/12/23  4.  Pt will be independent with urge drill and double voiding in order to improve ability to get to the bathroom and completely empty bladder.  Baseline:  Goal status: MET 12/12/23  5.  Pt will be able to correctly perform diaphragmatic breathing and appropriate pressure management in order to prevent worsening vaginal wall/uterine ligament laxity and improve pelvic floor A/ROM.   Baseline:  Goal status: IN PROGRESS 12/12/23  6.  Pt will improve pelvic floor muscle strength to 2/5 in order to decrease urinary incontinence and pad use to 3 pads a day.  Baseline:  Goal status: IN PROGRESS 12/12/23  LONG TERM GOALS: Updated 12/12/23  Pt will be independent with advanced HEP.   Baseline:  Goal status: IN PROGRESS 12/12/23  2.  Pt will report complete bladder emptying in order to reduce risk for infection.  Baseline:  Goal status: IN PROGRESS 12/12/23  3.  Pt will be able to go 2-3 hours in between voids without urgency or incontinence in order to improve QOL and perform all functional activities with less difficulty.   Baseline:  Goal  status: IN PROGRESS 12/12/23  4.  Pt will report no episodes of incontinence with sit<>stand transition.  Baseline:  Goal status: IN PROGRESS 12/12/23  5.  Pt will increase pelvic floor muscle strength to 3/5 in order to reduce pad use to no more than 2 a day. Baseline:  Goal status: IN PROGRESS 12/12/23  6.  Patient will increase pelvic floor muscle strength to 3/5 in order to re Baseline:  Goal status: IN PROGRESS 12/12/23  PLAN:  PT FREQUENCY: 1-2x/week  PT DURATION: 12 weeks   PLANNED INTERVENTIONS: 97110-Therapeutic exercises, 97530- Therapeutic activity, 97112- Neuromuscular re-education, 97535- Self Care, 02859- Manual therapy, 20560 (1-2 muscles), 20561 (3+ muscles), 02886- Aquatic Therapy, and Biofeedback  PLAN FOR NEXT SESSION: core training; pressure management; inverted activities  Josette Mares, PT, DPT09/09/254:15 PM

## 2023-12-25 NOTE — Telephone Encounter (Signed)
 Spoke with MD verbally reviewed Echo from 12/18/23 LVOT 20 EF 65% increase mavacamten  to 5 mg PO every day.   Called pt left a message to contact our office to discuss MD recommendations.

## 2023-12-26 ENCOUNTER — Encounter: Payer: Self-pay | Admitting: Obstetrics and Gynecology

## 2023-12-26 ENCOUNTER — Ambulatory Visit: Admitting: Obstetrics and Gynecology

## 2023-12-26 ENCOUNTER — Other Ambulatory Visit (HOSPITAL_COMMUNITY)
Admission: RE | Admit: 2023-12-26 | Discharge: 2023-12-26 | Disposition: A | Discharge status: Home / Self Care | Source: Other Acute Inpatient Hospital | Attending: Obstetrics and Gynecology | Admitting: Obstetrics and Gynecology

## 2023-12-26 VITALS — BP 120/70 | HR 72 | Ht 66.0 in | Wt 146.0 lb

## 2023-12-26 DIAGNOSIS — R319 Hematuria, unspecified: Secondary | ICD-10-CM

## 2023-12-26 DIAGNOSIS — N816 Rectocele: Secondary | ICD-10-CM

## 2023-12-26 DIAGNOSIS — R350 Frequency of micturition: Secondary | ICD-10-CM

## 2023-12-26 DIAGNOSIS — R82998 Other abnormal findings in urine: Secondary | ICD-10-CM | POA: Insufficient documentation

## 2023-12-26 DIAGNOSIS — N813 Complete uterovaginal prolapse: Secondary | ICD-10-CM | POA: Diagnosis not present

## 2023-12-26 DIAGNOSIS — R339 Retention of urine, unspecified: Secondary | ICD-10-CM | POA: Diagnosis not present

## 2023-12-26 LAB — URINALYSIS, ROUTINE W REFLEX MICROSCOPIC
Bilirubin Urine: NEGATIVE
Glucose, UA: NEGATIVE mg/dL
Ketones, ur: NEGATIVE mg/dL
Nitrite: NEGATIVE
Protein, ur: NEGATIVE mg/dL
Specific Gravity, Urine: 1.008 (ref 1.005–1.030)
WBC, UA: >50 WBC/hpf (ref 0–5)
pH: 6 (ref 5.0–8.0)

## 2023-12-26 LAB — POCT URINALYSIS DIP (CLINITEK)
Bilirubin, UA: NEGATIVE
Glucose, UA: NEGATIVE mg/dL
Ketones, POC UA: NEGATIVE mg/dL
Nitrite, UA: NEGATIVE
POC PROTEIN,UA: NEGATIVE
Spec Grav, UA: 1.01 (ref 1.010–1.025)
Urobilinogen, UA: 0.2 U/dL
pH, UA: 6 (ref 5.0–8.0)

## 2023-12-26 MED ORDER — MAVACAMTEN 5 MG PO CAPS: 5.0000 mg | ORAL_CAPSULE | Freq: Every day | ORAL | 0 refills | Status: DC

## 2023-12-26 NOTE — Telephone Encounter (Signed)
 Left a message to call back.

## 2023-12-26 NOTE — Telephone Encounter (Signed)
 Pt returned call

## 2023-12-26 NOTE — Progress Notes (Signed)
 [facility] New Patient Evaluation and Consultation  Referring Provider: Verena Mems, MD PCP: Verena Mems, MD Date of Service: 12/26/2023  SUBJECTIVE Chief Complaint: New Patient (Initial Visit) Brooke [name] is a 89 y.o. female is here for Prolapse/urinary retention)  History of Present Illness: Brooke [name] is a 89 y.o. White or Caucasian female seen in consultation at the request of Dr. Verena for evaluation of prolapse.  Currently doing pelvic PT with Brooke [name]. Kristen contacted the office yesterday to report patient is having difficulty emptying her bladder. Patient reports she cannot urinate most evening, then she has an unstoppable gush 2-3 times at night. She reports she has to shower, change her pad, panties, and Pjs.  Review of records significant for: Has HCOM and gets frequent Echocardiograms    Urinary Symptoms: Leaks urine with going from sitting to standing, with a full bladder, with movement to the bathroom, and with urgency Leaks 1-4 time(s) per days.  Pad use: 4 pads per day.   Patient is bothered by UI symptoms.  Day time voids 1-3.  Nocturia: 3 times per night to void. Voiding dysfunction:  does not empty bladder well.  Patient does not use a catheter to empty bladder.  When urinating, patient feels a weak stream, difficulty starting urine stream, and the need to urinate multiple times in a row Drinks: 64oz water per day  UTIs: 0 UTI's in the last year.   Denies history of urologic concerns. No results found for the last 90 days.   Pelvic Organ Prolapse Symptoms:                  Patient Admits to a feeling of a bulge the vaginal area. It has been present for 6 months.  Patient Admits to seeing a bulge.  This bulge is bothersome.  Bowel Symptom: Bowel movements: 6 time(s) per day Stool consistency: loose Straining: yes.  Splinting: no.  Incomplete evacuation: yes.  Patient Admits to accidental bowel leakage / fecal  incontinence  Occurs: When diarrhea is occurring  Consistency with leakage: soft  Bowel regimen: diet and fiber Last colonoscopy: Date unkown HM Colonoscopy   This patient has no relevant Health Maintenance data.     Sexual Function Sexually active: no.  Sexual orientation: Straight Pain with sex: No  Pelvic Pain Admits to pelvic pain Location: Vagina area Pain occurs: When she cannot pee Prior pain treatment: urination and tylenol  Improved by: urinating Worsened by: full bladder or bowel and the prolapsed uterus   Past Medical History:  Past Medical History:  Diagnosis Date   Aortic insufficiency    mild by cMRI 11/2021   CAD (coronary artery disease), native coronary artery    coronary Ca score 153 with mild calcified plaque in the proximal LAD 25-49% by coronary CTA 08/2020   Chest pain    Chronic kidney disease    Diverticulitis    HTN (hypertension)    IBS (irritable bowel syndrome)    Myopathy    Osteopenia    Pulmonic regurgitation    moderate by echo 05/2023     Past Surgical History:   Past Surgical History:  Procedure Laterality Date   CARDIAC CATHETERIZATION     CHOLECYSTECTOMY N/A 12/15/2021   Procedure: LAPAROSCOPIC CHOLECYSTECTOMY;  Surgeon: Aron Shoulders, MD;  Location: [facility] OR;  Service: General;  Laterality: N/A;   DILATION AND CURETTAGE OF UTERUS       Past OB/GYN History: [id number] Vaginal deliveries: 4,  Forceps/ Vacuum deliveries:  1 (first pregnancy with twins), Cesarean section: 0 Menopausal: Yes, at age 89 Contraception: N/a. Last pap smear was unknown.  Any history of abnormal pap smears: no. HM PAP   This patient has no relevant Health Maintenance data.     Medications: Patient has a current medication list which includes the following prescription(s): acetaminophen , alendronate, alpha-d-galactosidase, antiseptic oral rinse, aspirin, bacillus coagulans-inulin, calcium  carbonate, multiple vitamins-minerals, carvedilol , vitamin d-3,  colchicine , cyanocobalamin , diphenhydramine -apap (sleep), estradiol, famotidine , lactase, levothyroxine , loperamide -simethicone , loratadine, camzyos , multiple vitamins-minerals, rosuvastatin , simethicone , telmisartan , and benefiber.   Allergies: Patient is allergic to macrobid [nitrofurantoin monohyd macro], sulfa antibiotics, amlodipine , and ciprofloxacin.   Social History:  Social History   Tobacco Use   Smoking status: Never   Smokeless tobacco: Never  Vaping Use   Vaping status: Never Used  Substance Use Topics   Alcohol use: Yes    Comment: occ   Drug use: No    Relationship status: married Patient lives with husband.   Patient is not employed. Regular exercise: No History of abuse: No  Family History:   Family History  Problem Relation Age of Onset   Colon cancer Mother    Heart attack Father    Heart disease Brother    Hypertension Brother    Asthma Neg Hx    Lung cancer Neg Hx      Review of Systems: Review of Systems  Constitutional:  Positive for malaise/fatigue. Negative for chills and fever.  Respiratory:  Positive for shortness of breath. Negative for cough.   Cardiovascular:  Negative for chest pain and palpitations.  Gastrointestinal:  Positive for abdominal pain. Negative for blood in stool, constipation and diarrhea.  Genitourinary:  Positive for dysuria.       +Vaginal d/c  Skin:  Negative for rash.  Neurological:  Positive for dizziness. Negative for weakness.  Endo/Heme/Allergies:  Bruises/bleeds easily.  Psychiatric/Behavioral:  Negative for depression and suicidal ideas. The patient is nervous/anxious.      OBJECTIVE Physical Exam: Vitals:   12/26/23 1008  BP: 120/70  Pulse: 72  Weight: 146 lb (66.2 kg)  Height: 5' 6 (1.676 m)    Physical Exam Vitals reviewed. Exam conducted with a chaperone present.  Constitutional:      Appearance: Normal appearance.  Pulmonary:     Effort: Pulmonary effort is normal.  Abdominal:      Palpations: Abdomen is soft.  Neurological:     General: No focal deficit present.     Mental Status: She is alert and oriented to person, place, and time.  Psychiatric:        Mood and Affect: Mood normal.        Behavior: Behavior normal. Behavior is cooperative.        Thought Content: Thought content normal.      GU / Detailed Urogynecologic Evaluation:  Pelvic Exam: Normal external female genitalia; Bartholin's and Skene's glands normal in appearance; urethral meatus normal in appearance, no urethral masses or discharge.   CST: negative  Speculum exam reveals normal vaginal mucosa with atrophy. Cervix irritation from prolapse. Uterus normal single, nontender. Adnexa normal adnexa.     With apex supported, anterior compartment defect was present  Pelvic floor strength II/V  Pelvic floor musculature: Right levator non-tender, Right obturator non-tender, Left levator non-tender, Left obturator tender  POP-Q:   POP-Q  3  Aa   3                                           Ba  7                                              C   6.5                                            Gh  2.5                                            Pb  9.5                                            tvl   1                                            Ap  1                                            Bp  -1                                              D      Rectal Exam:  Normal external exam  Post-Void Residual (PVR) by Bladder Scan: In order to evaluate bladder emptying, we discussed obtaining a postvoid residual and patient agreed to this procedure.  Procedure: The ultrasound unit was placed on the patient's abdomen in the suprapubic region after the patient had voided.    Post Void Residual - 12/26/23 1023       Post Void Residual   Post Void Residual 451 mL           Laboratory Results: Lab Results  Component Value Date    COLORU yellow 12/26/2023   CLARITYU cloudy (A) 12/26/2023   GLUCOSEUR negative 12/26/2023   BILIRUBINUR NEGATIVE 12/26/2023   SPECGRAV 1.010 12/26/2023   RBCUR small (A) 12/26/2023   PHUR 6.0 12/26/2023   PROTEINUR NEGATIVE 12/26/2023   UROBILINOGEN 0.2 12/26/2023   LEUKOCYTESUR LARGE (A) 12/26/2023    Lab Results  Component Value Date   CREATININE 1.22 (H) 05/15/2023   CREATININE 1.23 (H) 04/24/2023   CREATININE 1.20 (H) 03/05/2023    No results found for: HGBA1C  Lab Results  Component Value Date   HGB 12.4 04/24/2023     ASSESSMENT AND PLAN Ms. Buitron is a 89 y.o. with:  1. Complete uterine prolapse with prolapse of anterior vaginal wall  2. Posterior vaginal wall prolapse   3. Urinary frequency   4. Incomplete bladder emptying   5. Leukocytes in urine   6. Hematuria, unspecified type    Patient has stage IV/IV prolapse. 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 Due to her advanced prolapse and difficulty emptying her bladder, a pessary was placed today at initial visit. A #6 Long Stem Gellhorn was placed without difficulty. Patient was able to attempt urination, walk around, and cough without pessary expulsion. She was instructed to keep the pessary in place.  We discussed if she would like to discuss surgical intervention, then colpocleisis would be the best option, however she is not the best surgical candidate due to risk of cognitive deficits with anesthesia and her heart condition. Also, if patient wants surgical intervention she would need UDS.  We discussed seeing how the pessary helps with her OAB symptoms as she will hopefully be able to empty better with the pessary in place and may have decreased urgency and frequency. If this is not helpful we can consider medication options.  Will check PVR at next appointment with pessary in place to establish if the obstruction was the sole cause of patient's  difficulty emptying her bladder.  Will send cathed urine sample for culture.  Will send cathed urine sample for Micro.   Patient to follow up for pessary check or sooner if needed.    Ryla Cauthon G Aydee Mcnew, NP

## 2023-12-26 NOTE — Telephone Encounter (Signed)
 Called pt reviewed MD recommendations.  Pt expresses gets dizzy and SOB a little but not much.  No other HF symptoms reported.   Is staying hydrated BP ranges 120-130/62's.  Has started OTC Benefiber medication is already on medication list.    PSF updated in [facility]  REMS portal script for mavacamten  5 mg PO every day sent to specialty pharmacy on file.

## 2023-12-26 NOTE — Patient Instructions (Signed)
 You have a stage 4 (out of 4) prolapse.  We discussed the fact that it is not life threatening but there are several treatment options. For treatment of pelvic organ prolapse, we discussed options for management including expectant management, conservative management, and surgical management, such as Kegels, a pessary, pelvic floor physical therapy, and specific surgical procedures.     Continue to use your estrogen cream x2 weekly inside the vagina.  You can consider surgery if you do not feel the pessary is working well for you.   Please call me if you are uncomfortable, the pessary comes out, or you cannot pee at all.

## 2023-12-28 ENCOUNTER — Ambulatory Visit: Payer: Self-pay | Admitting: Obstetrics and Gynecology

## 2023-12-28 DIAGNOSIS — R350 Frequency of micturition: Secondary | ICD-10-CM

## 2023-12-28 DIAGNOSIS — R339 Retention of urine, unspecified: Secondary | ICD-10-CM

## 2023-12-28 LAB — URINE CULTURE: Culture: >=100000 — AB

## 2023-12-28 MED ORDER — AMPICILLIN 500 MG PO CAPS: 500.0000 mg | ORAL_CAPSULE | Freq: Four times a day (QID) | ORAL | 0 refills | Status: DC

## 2024-01-01 ENCOUNTER — Ambulatory Visit

## 2024-01-01 DIAGNOSIS — M62838 Other muscle spasm: Secondary | ICD-10-CM

## 2024-01-01 DIAGNOSIS — M6281 Muscle weakness (generalized): Secondary | ICD-10-CM | POA: Diagnosis not present

## 2024-01-01 DIAGNOSIS — M5459 Other low back pain: Secondary | ICD-10-CM

## 2024-01-01 DIAGNOSIS — R279 Unspecified lack of coordination: Secondary | ICD-10-CM

## 2024-01-01 DIAGNOSIS — R293 Abnormal posture: Secondary | ICD-10-CM

## 2024-01-01 DIAGNOSIS — R102 Pelvic and perineal pain: Secondary | ICD-10-CM

## 2024-01-01 NOTE — Therapy (Signed)
 OUTPATIENT PHYSICAL THERAPY FEMALE PELVIC TREATMENT   Patient Name: Brooke [name] MRN: [id number] DOB:[birth year], 89 y.o., female Today's Date: 01/01/2024  END OF SESSION:  PT End of Session - 01/01/24 1403     Visit Number 8    Date for PT Re-Evaluation 01/31/24    Authorization Type HTA    Progress Note Due on Visit 10    PT Start Time 1401    PT Stop Time 1442    PT Time Calculation (min) 41 min    Activity Tolerance Patient tolerated treatment well    Behavior During Therapy [facility] for tasks assessed/performed           Past Medical History:  Diagnosis Date   Aortic insufficiency    mild by cMRI 11/2021   CAD (coronary artery disease), native coronary artery    coronary Ca score 153 with mild calcified plaque in the proximal LAD 25-49% by coronary CTA 08/2020   Chest pain    Chronic kidney disease    Diverticulitis    HTN (hypertension)    IBS (irritable bowel syndrome)    Myopathy    Osteopenia    Pulmonic regurgitation    moderate by echo 05/2023   Past Surgical History:  Procedure Laterality Date   CARDIAC CATHETERIZATION     CHOLECYSTECTOMY N/A 12/15/2021   Procedure: LAPAROSCOPIC CHOLECYSTECTOMY;  Surgeon: Aron Shoulders, MD;  Location: [facility] OR;  Service: General;  Laterality: N/A;   DILATION AND CURETTAGE OF UTERUS     Patient Active Problem List   Diagnosis Date Noted   HOCM (hypertrophic obstructive cardiomyopathy) (HCC) 09/04/2023   Pulmonic regurgitation    Chronic calculous cholecystitis 12/15/2021   CAD (coronary artery disease), native coronary artery    Hypertensive heart disease 06/08/2016   Aortic insufficiency 03/22/2015   Essential hypertension 03/22/2015   LVH (left ventricular hypertrophy) 03/22/2015   Abnormal PFT 09/17/2014   SOB (shortness of breath) 09/17/2014   DOE (dyspnea on exertion) 09/08/2014    PCP: Verena Mems, MD  REFERRING PROVIDER: Verena Mems, MD  REFERRING DIAG: N81.9 (ICD-10-CM) - Female genital prolapse,  unspecified  THERAPY DIAG:  Muscle weakness (generalized)  Unspecified lack of coordination  Other muscle spasm  Other low back pain  Pelvic pain  Abnormal posture  Rationale for Evaluation and Treatment: Rehabilitation  ONSET DATE: 02/16/2023  SUBJECTIVE:                                                                                                                                                                                           SUBJECTIVE STATEMENT: Pt states that she was able to get to  urogynecologist and had pessary placed. She is feeling much better, but is having some dribbling and feels like she has to urinate all the time. She did have UTI but is being treated for it.   PAIN: 12/12/23 Are you having pain? Yes NPRS scale: 0/10 Pain location: low abdomen over bladder; low back pain  Pain type: urgency Pain description: intermittent   Aggravating factors: having to urinate or have bowel movement  Relieving factors: being able to relieve herself  PRECAUTIONS: None  RED FLAGS: None   WEIGHT BEARING RESTRICTIONS: No  FALLS:  Has patient fallen in last 6 months? No  OCCUPATION: teacher  ACTIVITY LEVEL : walking  PLOF: Independent  PATIENT GOALS: be able to empty bladder better; empty bowels better; prevent things from getting worse  PERTINENT HISTORY:  Coronary artery disease, aortic insufficiency, chest pain, chronic kidney disease, IBS, osteopenia, diverticulitis Sexual abuse: No  BOWEL MOVEMENT: Pain with bowel movement: Yes Type of bowel movement:Type (Bristol Stool Scale) 1-7, Frequency 6x/day, Strain sometimes, and Splinting she will press in abdomen, but not perineum Fully empty rectum: No - feels like she has t ogo all day long Leakage: No Pads: Yes: see below Fiber supplement/laxative none  URINATION: Pain with urination: Yes Fully empty bladder: No Stream: hard to start, starts and stops - has to spread legs and bend way over to  restart Urgency: Yes  Frequency: unsure during the day because it is so hard for urine to come out; she can always urinate at night and in the morning; nocturia 2-3x/night Fluid Intake:  Leakage: Urge to void, Walking to the bathroom, and standing up Pads: Yes: 4-5 a day  INTERCOURSE: Not sexually active, no history of pain  PREGNANCY: Vaginal deliveries 4 Tearing Yes: - Episiotomy No C-section deliveries 0 Currently pregnant No  PROLAPSE: Pressure and Bulge   OBJECTIVE:  Note: Objective measures were completed at Evaluation unless otherwise noted.  11/08/23 PATIENT SURVEYS:   PFIQ-7: 62  COGNITION: Overall cognitive status: Within functional limits for tasks assessed     SENSATION: Light touch: Appears intact   FUNCTIONAL TESTS:  Squat: bil valgus knee collapse Single leg stance:  Rt: pelvic drop  Lt: pelvic drop Curl-up test: large distortion    GAIT: Assistive device utilized: None Comments: decreased bil hip extension  POSTURE: rounded shoulders, forward head, decreased lumbar lordosis, increased thoracic kyphosis, posterior pelvic tilt, and significant Rt iliac crest elevation   LUMBARAROM/PROM:  A/PROM A/PROM  Eval (% available)  Flexion 50  Extension 10  Right lateral flexion 50  Left lateral flexion 50  Right rotation 75  Left rotation 75   (Blank rows = not tested)  PALPATION:   General: tightness throughout bil lumbar paraspinals and glutes  Pelvic Alignment: Rt iliac crest elevation  Abdominal: increased sternocostal angle; tenderness in Lt lower quadrant                External Perineal Exam: presence of Grade 4 uterine ligament laxity; dry tissue                             Internal Pelvic Floor: low tone, no tenderness  Patient confirms identification and approves PT to assess internal pelvic floor and treatment Yes  PELVIC MMT:   MMT eval  Vaginal 1/5, 3 second hold, 10 repeat contractions  Diastasis Recti 3 finger widths  separation  (Blank rows = not tested)  TONE: low  PROLAPSE: Grade 4 uterine prolapse tested in supine   TODAY'S TREATMENT:                                                                                                                              DATE:  01/01/24 Neuromuscular re-education: Supine hip adduction ball press with transversus abdominus and pelvic floor muscle contractions and breath coordination 10x  Bridge with hip adduction, transversus abdominus, and pelvic floor muscle 2 x 10 Supine unilateral bent knee fall out + red band 10x bil Supine march + red band 2 x 10 Supine bridge with hip abduction + red band 10x Therapeutic activities: Strict bladder retraining Review of urge drill and how to use to reach voiding window Slow and steady intake of antibiotics  Squats 10x to table Standing shoulder extensions + green band 2 x 10 Standing pallof press + green band 10x bil 3 way kick 10x each, bil   12/25/23 Manual: Supine lower abdominal mobilization and bladder mobilization Neuromuscular re-education: Bridge + pelvic floor muscle contraction 2 x 10 Supine hip abduction + red band + pelvic floor muscle contraction 2 x 10 Supine unilateral bent knee fall out + red band 10x bil Supine march + red band 2 x 10 Therapeutic activities: Inverted lying: perform throughout the day (probably unnecessary in the morning) to help with bladder emptying Using clean hand and coconut oil, discussed how to gently pressure uterus back into vaginal canal, especially when she cannot empty bladder Self-lower abdominal massage for better relaxation   12/12/23 Neuromuscular re-education: Bridge with hip adduction, transversus abdominus, and pelvic floor muscle 2 x 10 Supine hip abduction + red band 2 x 10 Bridge with hip abduction + red band 2 x 10 Bridge with march 2 x 10 Supine full shoulder flexion with 3 lb weight 2 x 10 Horizontal abduction with bridge 2 x 10 green band      PATIENT EDUCATION:  Education details: See above Person educated: Patient Education method: Programmer, multimedia, Demonstration, Tactile cues, Verbal cues, and Handouts Education comprehension: verbalized understanding  HOME EXERCISE PROGRAM: [id number]  ASSESSMENT:  CLINICAL IMPRESSION: Patient is a 89 y.o. female who was seen today for physical therapy evaluation and treatment for prolapse, incomplete bowel/bladder emptying, and urinary incontinence. Pt doing much better this week after seeing urogynecologist and having pessary placed last week. She has been treated for UTI. She is having more urinary incontinence now and urgency. To help with this and help the body learn how to hold urine back again after having natural brace for this with prolapse, we discussed strict bladder retraining, slow intake of water, and urge drill. We also continued to review exercises from previous sessions now that she is feeling up for doing more at home. She iwll have to re-learn how to contract pelvic floor and achieve improved proprioception of contraction with uterus in more correct place. She will continue to benefit from skilled PT intervention in order  to improve pressure management, improve bladder and bowel control, increase pelvic floor muscle and core strength, improve quality of life, and progress strengthening program.   OBJECTIVE IMPAIRMENTS: decreased activity tolerance, decreased coordination, decreased endurance, decreased mobility, decreased ROM, decreased strength, increased fascial restrictions, increased muscle spasms, impaired flexibility, impaired tone, improper body mechanics, postural dysfunction, and pain.   ACTIVITY LIMITATIONS: bending, standing, squatting, transfers, bed mobility, and continence  PARTICIPATION LIMITATIONS: cleaning, interpersonal relationship, community activity, and care taking  PERSONAL FACTORS: 3+ comorbidities: medical history are also affecting patient's functional  outcome.   REHAB POTENTIAL: Good  CLINICAL DECISION MAKING: Evolving/moderate complexity  EVALUATION COMPLEXITY: Moderate   GOALS: Goals reviewed with patient? Yes  SHORT TERM GOALS: Updated 12/12/23   Pt will be independent with HEP.   Baseline: Goal status: MET 12/12/23  2.  Pt will be independent with applying vaginal moisturizer to vulva, introitus, and any prolapsed tissue in order to decrease discomfort and improve tissue integrity.  Baseline:  Goal status: MET 12/12/23  3.  Pt will be independent with reducing any prolapsed tissue back into vaginal canal with moisturizer in order to decrease discomfort and help more completely empty bladder to reduce risk of infection.  Baseline:  Goal status: MET 12/12/23  4.  Pt will be independent with urge drill and double voiding in order to improve ability to get to the bathroom and completely empty bladder.  Baseline:  Goal status: MET 12/12/23  5.  Pt will be able to correctly perform diaphragmatic breathing and appropriate pressure management in order to prevent worsening vaginal wall/uterine ligament laxity and improve pelvic floor A/ROM.   Baseline:  Goal status: IN PROGRESS 12/12/23  6.  Pt will improve pelvic floor muscle strength to 2/5 in order to decrease urinary incontinence and pad use to 3 pads a day.  Baseline:  Goal status: IN PROGRESS 12/12/23  LONG TERM GOALS: Updated 12/12/23  Pt will be independent with advanced HEP.   Baseline:  Goal status: IN PROGRESS 12/12/23  2.  Pt will report complete bladder emptying in order to reduce risk for infection.  Baseline:  Goal status: IN PROGRESS 12/12/23  3.  Pt will be able to go 2-3 hours in between voids without urgency or incontinence in order to improve QOL and perform all functional activities with less difficulty.   Baseline:  Goal status: IN PROGRESS 12/12/23  4.  Pt will report no episodes of incontinence with sit<>stand transition.  Baseline:  Goal status:  IN PROGRESS 12/12/23  5.  Pt will increase pelvic floor muscle strength to 3/5 in order to reduce pad use to no more than 2 a day. Baseline:  Goal status: IN PROGRESS 12/12/23  6.  Patient will increase pelvic floor muscle strength to 3/5 in order to re Baseline:  Goal status: IN PROGRESS 12/12/23  PLAN:  PT FREQUENCY: 1-2x/week  PT DURATION: 12 weeks   PLANNED INTERVENTIONS: 97110-Therapeutic exercises, 97530- Therapeutic activity, 97112- Neuromuscular re-education, 97535- Self Care, 02859- Manual therapy, 20560 (1-2 muscles), 20561 (3+ muscles), 02886- Aquatic Therapy, and Biofeedback  PLAN FOR NEXT SESSION: core training; pressure management; inverted activities  Josette Mares, PT, DPT09/16/252:30 PM

## 2024-01-07 NOTE — Addendum Note (Signed)
 Addended by: RANDY HAMP SAILOR on: 01/07/2024 03:29 PM   Modules accepted: Orders

## 2024-01-08 ENCOUNTER — Ambulatory Visit

## 2024-01-08 DIAGNOSIS — R279 Unspecified lack of coordination: Secondary | ICD-10-CM

## 2024-01-08 DIAGNOSIS — M6281 Muscle weakness (generalized): Secondary | ICD-10-CM

## 2024-01-08 NOTE — Therapy (Signed)
 OUTPATIENT PHYSICAL THERAPY FEMALE PELVIC TREATMENT   Patient Name: Brooke [name] MRN: [id number] DOB:12-03-1934, 89 y.o., female Today's Date: 01/08/2024  END OF SESSION:  PT End of Session - 01/08/24 1403     Visit Number 9    Date for Recertification  01/31/24    Authorization Type HTA    Progress Note Due on Visit 10    PT Start Time 1401    PT Stop Time 1430    PT Time Calculation (min) 29 min    Activity Tolerance Patient tolerated treatment well    Behavior During Therapy [facility] for tasks assessed/performed           Past Medical History:  Diagnosis Date   Aortic insufficiency    mild by cMRI 11/2021   CAD (coronary artery disease), native coronary artery    coronary Ca score 153 with mild calcified plaque in the proximal LAD 25-49% by coronary CTA 08/2020   Chest pain    Chronic kidney disease    Diverticulitis    HTN (hypertension)    IBS (irritable bowel syndrome)    Myopathy    Osteopenia    Pulmonic regurgitation    moderate by echo 05/2023   Past Surgical History:  Procedure Laterality Date   CARDIAC CATHETERIZATION     CHOLECYSTECTOMY N/A 12/15/2021   Procedure: LAPAROSCOPIC CHOLECYSTECTOMY;  Surgeon: Aron Shoulders, MD;  Location: [facility] OR;  Service: General;  Laterality: N/A;   DILATION AND CURETTAGE OF UTERUS     Patient Active Problem List   Diagnosis Date Noted   HOCM (hypertrophic obstructive cardiomyopathy) (HCC) 09/04/2023   Pulmonic regurgitation    Chronic calculous cholecystitis 12/15/2021   CAD (coronary artery disease), native coronary artery    Hypertensive heart disease 06/08/2016   Aortic insufficiency 03/22/2015   Essential hypertension 03/22/2015   LVH (left ventricular hypertrophy) 03/22/2015   Abnormal PFT 09/17/2014   SOB (shortness of breath) 09/17/2014   DOE (dyspnea on exertion) 09/08/2014    PCP: Verena Mems, MD  REFERRING PROVIDER: Verena Mems, MD  REFERRING DIAG: N81.9 (ICD-10-CM) - Female genital prolapse,  unspecified  THERAPY DIAG:  Muscle weakness (generalized)  Unspecified lack of coordination  Rationale for Evaluation and Treatment: Rehabilitation  ONSET DATE: 02/16/2023  SUBJECTIVE:                                                                                                                                                                                           SUBJECTIVE STATEMENT: Pt still having trouble working in exercises currently as she is getting 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 She does continue to report progress with  all symptoms and that the pessary has been very helpful. Pt feels like she does not need to come to PT at this time anymore.   PAIN: 01/08/24 Are you having pain? Yes NPRS scale: 0/10 Pain location: low abdomen over bladder; low back pain  Pain type: urgency Pain description: intermittent   Aggravating factors: having to urinate or have bowel movement  Relieving factors: being able to relieve herself  PRECAUTIONS: None  RED FLAGS: None   WEIGHT BEARING RESTRICTIONS: No  FALLS:  Has patient fallen in last 6 months? No  OCCUPATION: teacher  ACTIVITY LEVEL : walking  PLOF: Independent  PATIENT GOALS: be able to empty bladder better; empty bowels better; prevent things from getting worse  PERTINENT HISTORY:  Coronary artery disease, aortic insufficiency, chest pain, chronic kidney disease, IBS, osteopenia, diverticulitis Sexual abuse: No  BOWEL MOVEMENT: Pain with bowel movement: Yes Type of bowel movement:Type (Bristol Stool Scale) 1-7, Frequency 6x/day, Strain sometimes, and Splinting she will press in abdomen, but not perineum Fully empty rectum: No - feels like she has t ogo all day long Leakage: No Pads: Yes: see below Fiber supplement/laxative none  URINATION: Pain with urination: Yes Fully empty bladder: No Stream: hard to start, starts and stops - has to spread legs and bend way over to restart Urgency: Yes  Frequency: unsure  during the day because it is so hard for urine to come out; she can always urinate at night and in the morning; nocturia 2-3x/night Fluid Intake:  Leakage: Urge to void, Walking to the bathroom, and standing up Pads: Yes: 4-5 a day  INTERCOURSE: Not sexually active, no history of pain  PREGNANCY: Vaginal deliveries 4 Tearing Yes: - Episiotomy No C-section deliveries 0 Currently pregnant No  PROLAPSE: Pressure and Bulge   OBJECTIVE:  Note: Objective measures were completed at Evaluation unless otherwise noted.  11/08/23 PATIENT SURVEYS:   PFIQ-7: 62  COGNITION: Overall cognitive status: Within functional limits for tasks assessed     SENSATION: Light touch: Appears intact   FUNCTIONAL TESTS:  Squat: bil valgus knee collapse Single leg stance:  Rt: pelvic drop  Lt: pelvic drop Curl-up test: large distortion    GAIT: Assistive device utilized: None Comments: decreased bil hip extension  POSTURE: rounded shoulders, forward head, decreased lumbar lordosis, increased thoracic kyphosis, posterior pelvic tilt, and significant Rt iliac crest elevation   LUMBARAROM/PROM:  A/PROM A/PROM  Eval (% available)  Flexion 50  Extension 10  Right lateral flexion 50  Left lateral flexion 50  Right rotation 75  Left rotation 75   (Blank rows = not tested)  PALPATION:   General: tightness throughout bil lumbar paraspinals and glutes  Pelvic Alignment: Rt iliac crest elevation  Abdominal: increased sternocostal angle; tenderness in Lt lower quadrant                External Perineal Exam: presence of Grade 4 uterine ligament laxity; dry tissue                             Internal Pelvic Floor: low tone, no tenderness  Patient confirms identification and approves PT to assess internal pelvic floor and treatment Yes  PELVIC MMT:   MMT eval  Vaginal 1/5, 3 second hold, 10 repeat contractions  Diastasis Recti 3 finger widths separation  (Blank rows = not tested)         TONE: low  PROLAPSE: Grade 4 uterine prolapse tested in  supine   TODAY'S TREATMENT:                                                                                                                              DATE:  01/08/24 Therapeutic activities: 3 way kick 10x each, bil Standing bil shoulder extension + green band 3 x 10 Pallof press + green band 2 x 10 bil Squats to table 2 x 10 Review urge drill Review voiding schedule and bladder retraining   01/01/24 Neuromuscular re-education: Supine hip adduction ball press with transversus abdominus and pelvic floor muscle contractions and breath coordination 10x  Bridge with hip adduction, transversus abdominus, and pelvic floor muscle 2 x 10 Supine unilateral bent knee fall out + red band 10x bil Supine march + red band 2 x 10 Supine bridge with hip abduction + red band 10x Therapeutic activities: Strict bladder retraining Review of urge drill and how to use to reach voiding window Slow and steady intake of antibiotics  Squats 10x to table Standing shoulder extensions + green band 2 x 10 Standing pallof press + green band 10x bil 3 way kick 10x each, bil   12/25/23 Manual: Supine lower abdominal mobilization and bladder mobilization Neuromuscular re-education: Bridge + pelvic floor muscle contraction 2 x 10 Supine hip abduction + red band + pelvic floor muscle contraction 2 x 10 Supine unilateral bent knee fall out + red band 10x bil Supine march + red band 2 x 10 Therapeutic activities: Inverted lying: perform throughout the day (probably unnecessary in the morning) to help with bladder emptying Using clean hand and coconut oil, discussed how to gently pressure uterus back into vaginal canal, especially when she cannot empty bladder Self-lower abdominal massage for better relaxation   PATIENT EDUCATION:  Education details: See above Person educated: Patient Education method: Explanation, Demonstration, Tactile cues,  Verbal cues, and Handouts Education comprehension: verbalized understanding  HOME EXERCISE PROGRAM: [id number]  ASSESSMENT:  CLINICAL IMPRESSION: Patient is a 89 y.o. female who was seen today for physical therapy evaluation and treatment for prolapse, incomplete bowel/bladder emptying, and urinary incontinence. Pt doing very well overall. She is able to completely empty her bladder and is not having any pelvic pain. She reports her urinary incontinence has been improving as she remembers to activate her pelvic floor as her body gets used to having to do this again. We reviewed standing exercises and more challenging core exercises today and made sure she feels comfortable with HEP. Due to progress, having met goals, and feeling confident with HEP, she is prepared to D/C skilled PT intervention at this time. She was encouraged to call with any questions or concerns.   OBJECTIVE IMPAIRMENTS: decreased activity tolerance, decreased coordination, decreased endurance, decreased mobility, decreased ROM, decreased strength, increased fascial restrictions, increased muscle spasms, impaired flexibility, impaired tone, improper body mechanics, postural dysfunction, and pain.   ACTIVITY LIMITATIONS: bending, standing, squatting, transfers, bed mobility, and continence  PARTICIPATION LIMITATIONS: cleaning, interpersonal  relationship, community activity, and care taking  PERSONAL FACTORS: 3+ comorbidities: medical history are also affecting patient's functional outcome.   REHAB POTENTIAL: Good  CLINICAL DECISION MAKING: Evolving/moderate complexity  EVALUATION COMPLEXITY: Moderate   GOALS: Goals reviewed with patient? Yes  SHORT TERM GOALS: Updated 01/08/24   Pt will be independent with HEP.   Baseline: Goal status: MET 12/12/23  2.  Pt will be independent with applying vaginal moisturizer to vulva, introitus, and any prolapsed tissue in order to decrease discomfort and improve tissue integrity.   Baseline:  Goal status: MET 12/12/23  3.  Pt will be independent with reducing any prolapsed tissue back into vaginal canal with moisturizer in order to decrease discomfort and help more completely empty bladder to reduce risk of infection.  Baseline:  Goal status: MET 12/12/23  4.  Pt will be independent with urge drill and double voiding in order to improve ability to get to the bathroom and completely empty bladder.  Baseline:  Goal status: MET 12/12/23  5.  Pt will be able to correctly perform diaphragmatic breathing and appropriate pressure management in order to prevent worsening vaginal wall/uterine ligament laxity and improve pelvic floor A/ROM.   Baseline:  Goal status: MET 01/08/24  6.  Pt will improve pelvic floor muscle strength to 2/5 in order to decrease urinary incontinence and pad use to 3 pads a day.  Baseline:  Goal status: MET 01/08/24  LONG TERM GOALS: Updated 01/08/24  Pt will be independent with advanced HEP.   Baseline:  Goal status: MET 01/08/24  2.  Pt will report complete bladder emptying in order to reduce risk for infection.  Baseline:  Goal status: MET 01/08/24  3.  Pt will be able to go 2-3 hours in between voids without urgency or incontinence in order to improve QOL and perform all functional activities with less difficulty.   Baseline:  Goal status: DISCHARGED 01/08/24  4.  Pt will report no episodes of incontinence with sit<>stand transition.  Baseline:  Goal status: MET 01/08/24  5.  Pt will increase pelvic floor muscle strength to 3/5 in order to reduce pad use to no more than 2 a day. Baseline:  Goal status: DISCHARGED 01/08/24    PLAN:  PT FREQUENCY: 1-2x/week  PT DURATION: 12 weeks   PLANNED INTERVENTIONS: 97110-Therapeutic exercises, 97530- Therapeutic activity, 97112- Neuromuscular re-education, 97535- Self Care, 02859- Manual therapy, 20560 (1-2 muscles), 20561 (3+ muscles), 02886- Aquatic Therapy, and Biofeedback  PLAN FOR NEXT  SESSION: core training; pressure management; inverted activities  PHYSICAL THERAPY DISCHARGE SUMMARY  Visits from Start of Care: 9  Current functional level related to goals / functional outcomes: Independent   Remaining deficits: See below   Education / Equipment: HEP   Patient agrees to discharge. Patient goals were partially met. Patient is being discharged due to being pleased with the current functional level.  Josette Mares, PT, DPT09/23/252:40 PM

## 2024-01-09 DIAGNOSIS — N819 Female genital prolapse, unspecified: Secondary | ICD-10-CM | POA: Diagnosis not present

## 2024-01-09 DIAGNOSIS — N183 Chronic kidney disease, stage 3 unspecified: Secondary | ICD-10-CM | POA: Diagnosis not present

## 2024-01-11 ENCOUNTER — Ambulatory Visit: Attending: Internal Medicine | Admitting: Internal Medicine

## 2024-01-11 VITALS — BP 134/56 | HR 75 | Ht 66.0 in | Wt 147.6 lb

## 2024-01-11 DIAGNOSIS — I421 Obstructive hypertrophic cardiomyopathy: Secondary | ICD-10-CM | POA: Diagnosis not present

## 2024-01-11 DIAGNOSIS — I371 Nonrheumatic pulmonary valve insufficiency: Secondary | ICD-10-CM

## 2024-01-11 DIAGNOSIS — I2510 Atherosclerotic heart disease of native coronary artery without angina pectoris: Secondary | ICD-10-CM | POA: Diagnosis not present

## 2024-01-11 NOTE — Progress Notes (Signed)
 Cardiology Office Note:  .    Date:  01/11/2024  ID:  Brooke Brooke [name], DOB 05-21-1934, MRN [id number] PCP: Chrystal Lamarr RAMAN, Brooke [name]  [facility] Providers Cardiologist:  Wilbert Bihari, Brooke [name]     CC: [facility] f/u  History of Present Illness: .    Brooke Brooke [name] is a 89 y.o. female with obstructive hypertrophic cardiomyopathy who presents with shortness of breath, chest pain, and dizziness.  Brooke Brooke [name] is an 89 year old female with hypertrophic cardiomyopathy who presents for follow-up regarding her medication regimen and persistent symptoms.  She experiences persistent fatigue despite adjustments in her treatment regimen. Fatigue was notably reduced when she was on 5 mg of Mavacamten , but it returned when the dose was decreased to 2.5 mg. She has recently resumed the 5 mg dose.  Her chest pain has resolved, and shortness of breath has significantly improved, with only occasional episodes in the last month. Improvement in shortness of breath has been noted this week.  Dizziness persists, described as feeling 'a little bit dizzy.'  She recently had a urinary tract infection and consulted with a specialty pharmacist to ensure her cardiac medication was safe to continue during the infection.  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 She has support from a son who lives in town and helps once a week. She is also part of a support group and a political group, which she finds helpful.  Discussed the use of AI scribe software for clinical note transcription with the patient, who gave verbal consent to proceed.  Relevant histories: .  Social  - She is the primary caregiver for her husband, who has dementia, and is concerned about her ability to continue in this role due to her symptoms - has four children, one of whom is non-amenable to screening ROS: As per HPI.   Studies Reviewed: .     Cardiac Studies & Procedures    ______________________________________________________________________________________________   STRESS TESTS  ECHOCARDIOGRAM STRESS TEST 06/27/2023  Narrative EXERCISE STRESS ECHO REPORT   --------------------------------------------------------------------------------  Patient Name:   Brooke Brooke [name] Date of Exam: 06/27/2023 Medical Rec #:  [id number]       Height:       67.0 in Accession #:    [id number]      Weight:       152.0 lb Date of Birth:  12/25/1934       BSA:          1.800 m Patient Age:    88 years        BP:           131/52 mmHg Patient Gender: F               HR:           69 bpm. Exam Location:  [address]  Procedure: 2D Echo, Limited Color Doppler, Color Doppler and Stress Echo  Indications:     HOCM evaluation-squat to stand protocol Dyspnea on exertion R06.00  History:         Patient has prior history of Echocardiogram examinations, most recent 05/24/2023. Previous echo revealed LVEF 75% severe LVH.  Sonographer:     Brooke Brooke [name], Brooke [name] Referring Phys:  [id number] [name] Brooke Brooke [name] Diagnosing Phys: Brooke Brooke [name]  IMPRESSIONS   1. Squat to Stand protocol (Mayo protocol). Dynamic maneuvers performed to elicit LVOT/intracavitary gradient. 2. The findings of the study demonstrate that with dynamic maneuvers, a dynamic  outflow tract obstruction is present (peak LVOT gradient 67 mmHG). 3. At rest, intracavitary interrogation demonstrates max instantaneous gradient of 16 mmHg, and with Valsalva intracavitary gradient is 49 mmHg. With Valsalva, the LVOT max instantaneous gradient is 67 mmHg. The level of obstruction for maximum gradient is likely the basal-mid septum. Repetitive squat to stand and treadmill exercise failed to demonstrate a dyanmic late peaking signal, and are indeterminate for obstructive physiology. 4. Left atrial size is mildly dilated. Left atrial reservoir strain is reduced at 20.6%. 5. This is an inconclusive stress  echocardiogram for ischemia. Non-diagnostic study for ischemia due to protocol. Patient had below average exercise capacity limited by shortness of breath, exercising for 2 minutes and 5 seconds acheiving 76% predicted HR. 6. Baseline echo findings: EF 70-75%, concentric LVH with 15 mm wall thickness, mild to mild-moderate AI incompletely interrogated, degenerative mitral valve with no definite systolic anterior motion of the mitral valve, mild MR that does not significantly increase with dynamic maneuvers.  FINDINGS  Exam Protocol: The patient exercised on a treadmill according to a Bruce protocol. Squat to stand protocol.   Patient Performance: The patient exercised for 2 minutes and 05 seconds, achieving 4.6 METS. The maximum stage achieved was I of the Bruce protocol. The baseline heart rate was 66 bpm. The heart rate at peak stress was 92 bpm. The target heart rate was calculated to be 113 bpm. The percentage of maximum predicted heart rate achieved was 68.9 %. The baseline blood pressure was 131/52 mmHg. The blood pressure at peak stress was 162/67 mmHg. The blood pressure response was normal. The patient developed shortness of breath during the stress exam. The symptoms resolved with rest. The patient's functional capacity was below average.  EKG: Resting EKG showed normal sinus rhythm with no abnormal findings. The patient developed no abnormal EKG findings during exercise.   2D Echo Findings: The baseline ejection fraction was 70-75%. The peak ejection fraction at stress was 75%. Baseline regional wall motion abnormalities were not present. This is an inconclusive stress echocardiogram for ischemia. This is a non-diagnostic study for ischemia due to protocol.  Stress Doppler:  LVOT: The baseline LVOT gradient was 16 mmHG. The peak LVOT gradient at stress was 67 mmHG. The findings of the study demonstrate that a stress-induced outflow tract obstruction is present.  Mitral  Regurgitation: At baseline, mild mitral regurgitation was present.   Brooke Brooke [name] Electronically signed on 06/27/2023 at 5:46:22 PM      Final   ECHOCARDIOGRAM  ECHOCARDIOGRAM LIMITED 12/18/2023  Narrative ECHOCARDIOGRAM LIMITED REPORT    Patient Name:   Brooke Brooke [name] Date of Exam: 12/18/2023 Medical Rec #:  [id number]       Height:       67.0 in Accession #:    [id number]      Weight:       153.6 lb Date of Birth:  11-Apr-1935       BSA:          1.808 m Patient Age:    89 years        BP:           154/70 mmHg Patient Gender: F               HR:           67 bpm. Exam Location:  [address]  Procedure: Limited Echo, Cardiac Doppler and Limited Color Doppler (Both Spectral and Color Flow Doppler were utilized during procedure).  Indications:  I42.1 HOCM  History:        Patient has prior history of Echocardiogram examinations, most recent 11/20/2023. CAD, Signs/Symptoms:Chest Pain, Shortness of Breath, Dizziness/Lightheadedness and Fatigue; Risk Factors:Hypertension and Family History of Coronary Artery Disease. CKD, Obesity, HOCM- Camzyos  2.5mg .  Sonographer:    Brooke Brooke [name] Referring Phys: Brooke Brooke [name]   Sonographer Comments: Camzyos - 2.5mg  (no change from prior Exam) IMPRESSIONS   1. Camzyos  2.5 mg. Left ventricular outflow gradient 13 mmHg at rest and with sit-stand, 17 mmHg with valsalva. Left ventricular ejection fraction, by estimation, is 65 to 70%. The left ventricle has hyperdynamic function. The left ventricle has no regional wall motion abnormalities. There is moderate asymmetric left ventricular hypertrophy of the basal-septal segment. Left ventricular diastolic parameters are consistent with Grade I diastolic dysfunction (impaired relaxation). 2. Right ventricular systolic function is normal. The right ventricular size is normal. Tricuspid regurgitation signal is inadequate for assessing PA pressure. 3. Left atrial size was  moderately dilated. 4. No mitral valve systolic anterior motion noted. Trivial mitral valve regurgitation. No evidence of mitral stenosis. 5. The aortic valve is tricuspid. Aortic valve regurgitation is moderate. No aortic stenosis is present. 6. IVC not visualized.  FINDINGS Left Ventricle: Camzyos  2.5 mg. Left ventricular outflow gradient 13 mmHg at rest and with sit-stand, 17 mmHg with valsalva. Left ventricular ejection fraction, by estimation, is 65 to 70%. The left ventricle has hyperdynamic function. The left ventricle has no regional wall motion abnormalities. The left ventricular internal cavity size was normal in size. There is moderate asymmetric left ventricular hypertrophy of the basal-septal segment. Left ventricular diastolic parameters are consistent with Grade I diastolic dysfunction (impaired relaxation).  Right Ventricle: The right ventricular size is normal. No increase in right ventricular wall thickness. Right ventricular systolic function is normal. Tricuspid regurgitation signal is inadequate for assessing PA pressure.  Left Atrium: Left atrial size was moderately dilated.  Right Atrium: Right atrial size was normal in size.  Mitral Valve: No mitral valve systolic anterior motion noted. Trivial mitral valve regurgitation. No evidence of mitral valve stenosis. MV peak gradient, 9.9 mmHg. The mean mitral valve gradient is 3.0 mmHg.  Tricuspid Valve: The tricuspid valve is normal in structure. Tricuspid valve regurgitation is not demonstrated.  Aortic Valve: The aortic valve is tricuspid. Aortic valve regurgitation is moderate. Aortic regurgitation PHT measures 480 msec. No aortic stenosis is present. Aortic valve mean gradient measures 8.5 mmHg. Aortic valve peak gradient measures 15.2 mmHg. Aortic valve area, by VTI measures 2.80 cm.  Pulmonic Valve: The pulmonic valve was normal in structure. Pulmonic valve regurgitation is trivial.  Aorta: The aortic root is normal  in size and structure.  Venous: IVC not visualized.  IAS/Shunts: No atrial level shunt detected by color flow Doppler.  LEFT VENTRICLE PLAX 2D LVIDd:         3.50 cm   Diastology LVIDs:         2.10 cm   LV e' medial:    5.59 cm/s LV PW:         1.00 cm   LV E/e' medial:  17.4 LV IVS:        1.10 cm   LV e' lateral:   7.46 cm/s LVOT diam:     2.00 cm   LV E/e' lateral: 13.0 LV SV:         122 LV SV Index:   67 LVOT Area:     3.14 cm   RIGHT VENTRICLE RV Basal diam:  3.20 cm RV S prime:     16.50 cm/s TAPSE (M-mode): 2.5 cm  LEFT ATRIUM            Index        RIGHT ATRIUM           Index LA diam:      3.00 cm  1.66 cm/m   RA Pressure: 3.00 mmHg LA Vol (A2C): 105.0 ml 58.09 ml/m  RA Area:     14.90 cm LA Vol (A4C): 57.9 ml  32.03 ml/m  RA Volume:   39.00 ml  21.57 ml/m AORTIC VALVE AV Area (Vmax):    2.69 cm AV Area (Vmean):   2.63 cm AV Area (VTI):     2.80 cm AV Vmax:           195.00 cm/s AV Vmean:          135.500 cm/s AV VTI:            0.435 m AV Peak Grad:      15.2 mmHg AV Mean Grad:      8.5 mmHg LVOT Vmax:         167.00 cm/s LVOT Vmean:        113.500 cm/s LVOT VTI:          0.388 m LVOT/AV VTI ratio: 0.89 AI PHT:            480 msec  AORTA Ao Root diam: 2.80 cm Ao Asc diam:  3.10 cm  MITRAL VALVE                TRICUSPID VALVE MV Area (PHT)  cm          Estimated RAP:  3.00 mmHg MV Area VTI:   3.11 cm MV Peak grad:  9.9 mmHg     SHUNTS MV Mean grad:  3.0 mmHg     Systemic VTI:  0.39 m MV Vmax:       1.57 m/s     Systemic Diam: 2.00 cm MV Vmean:      70.6 cm/s MV Decel Time: 217 msec MV E velocity: 97.25 cm/s MV A velocity: 121.50 cm/s MV E/A ratio:  0.80  Brooke Brooke [name] Electronically signed by Brooke Brooke [name]: 12/18/2023/1:44:02 PM    Final      CT SCANS  CT CORONARY MORPH W/CTA COR W/SCORE 08/16/2020  Addendum 08/16/2020  1:44 PM ADDENDUM REPORT: 08/16/2020 13:42  CLINICAL DATA:  Chest  pain  EXAM: Cardiac CTA  MEDICATIONS: Sub lingual nitro. 4 mg and lopressor  100mg   TECHNIQUE: The patient was scanned on a [employer] 192 scanner. Gantry rotation speed was 250 msecs. Collimation was. 6 mm . A 120 kV prospective scan was triggered in the ascending thoracic aorta at 140 HU's with full mA between 30-70% of the R-R interval . Average HR during the scan was 63 bpm. The 3D data set was interpreted on a dedicated work station using MPR, MIP and VRT modes. A total of 80 cc of contrast was used.  FINDINGS: Non-cardiac: See separate report from [facility]. No significant findings on limited lung and soft tissue windows.  Calcium  score: 153 isolated to proximal LAD  Coronary Arteries: Right dominant with no anomalies  LM: Normal  LAD: Tortuous vessel, 25-49% calcified plaque in proximal vessel there is some diffuse narrowing 25-49% with soft plaque in the distal vessel after D2 and before D3  D1: Normal  D2: Normal  D3: Normal  Circumflex: Tortuous vessels  normal  OM1: Normal  OM2: Normal  RCA: Normal  PDA: Normal  PLA: Normal  IMPRESSION: 1. Calcium  score 153 which is 51 st percentile for age and sex  2.  CAD RADS 2 non obstructive CAD in LAD see description above  3.  Normal aortic root 3.3 cm  Brooke Brooke [name]   Electronically Signed By: Brooke Brooke [name] M.D. On: 08/16/2020 13:42  Narrative EXAM: OVER-READ INTERPRETATION  CT CHEST  The following report is an over-read performed by radiologist Dr. Toribio Brooke [name] of [facility] on 08/16/2020. This over-read does not include interpretation of cardiac or coronary anatomy or pathology. The coronary calcium  score/coronary CTA interpretation by the cardiologist is attached.  COMPARISON:  None.  FINDINGS: Atherosclerotic calcifications in the thoracic aorta. In the left upper lobe (axial image 14 of series 10 and coronal image 39 of series 602) there is a  irregular-shaped 1.7 x 0.8 x 0.8 cm nodule with slightly indistinct margins. Within the visualized portions of the thorax there is no acute consolidative airspace disease, no pleural effusions, no pneumothorax and no lymphadenopathy. Visualized portions of the upper abdomen are unremarkable. There are no aggressive appearing lytic or blastic lesions noted in the visualized portions of the skeleton.  IMPRESSION: 1. 1.7 x 0.8 x 0.8 cm left upper lobe nodule with slightly indistinct margins. This may simply be infectious or inflammatory in etiology, however, underlying neoplasm is not excluded. Short-term follow-up noncontrast chest CT is recommended in 1-2 months to ensure the stability or regression of this finding. 2.  Aortic Atherosclerosis (ICD10-I70.0).  Electronically Signed: By: Brooke Brooke [name] M.D. On: 08/16/2020 13:01   CARDIAC MRI  MR CARDIAC MORPHOLOGY W WO CONTRAST 11/16/2021  Narrative CLINICAL DATA:  Clinical question of infiltrative cardiomyopathy Study assumes BSA of 1.78 m2.  EXAM: CARDIAC MRI  TECHNIQUE: The patient was scanned on a 1.5 Tesla GE magnet. A dedicated cardiac coil was used. Functional imaging was done using Fiesta sequences. 2,3, and 4 chamber views were done to assess for RWMA's. Modified Simpson's rule using a short axis stack was used to calculate an ejection fraction on a dedicated work [employer]. The patient received 10 cc of Gadavist . After 10 minutes inversion recovery sequences were used to assess for infiltration and scar tissue.  CONTRAST:  10 cc  of Gadavist   FINDINGS: 1. Normal left ventricular size, with LVEDD 39 mm, and LVEDVi 46 mL/m2.  Mild concentric remodeling, with intraventricular septal thickness of 11 mm, posterior wall thickness of 11 mm, and myocardial mass index of 48 g/m2.  Normal left ventricular systolic function (LVEF =65%). There are no regional wall motion abnormalities, in the three  chamber view there is evidence of systolic anterior motion of the mitral valve and LVOT obstruction.  Left ventricular parametric mapping notable for normal native T1 and T2.  There is no late gadolinium enhancement in the left ventricular myocardium.  2. Normal right ventricular size with RVEDVI 55 mL/m2.  Normal right ventricular thickness.  Normal right ventricular systolic function (RVEF =59%). There are no regional wall motion abnormalities or aneurysms.  There is notable fat overlying the right ventricular pericardium without fatty infiltration.  3.  Normal left and right atrial size.  4.  Normal size of the aortic root, ascending aorta.  Mild pulmonary artery dilation 29 mm.  5. Valve assessment:  Aortic Valve: Morphology not well visualized. Mild aortic regurgitation, regurgitant fraction 17%.  Pulmonic Valve: No significant pulmonary insufficiency, regurgitant fraction 1.4%.  Tricuspid Valve:  Mild tricuspid regurgitation.  Mitral Valve: Mild mitral regurgitation, regurgitant fraction 8%.  6.  Normal pericardium.  No pericardial effusion.  7. Grossly, no extracardiac findings. Recommended dedicated study if concerned for non-cardiac pathology.  IMPRESSION: Normal LVEF  Normal parametric mapping and no LGE; not consistent with infiltrative LGE.  Evidence of LVOT obstruction in three chamber view without evidence of significant LV hypertrophy.  Mild Aortic regurgitation.  Brooke Leavens Brooke [name]   Electronically Signed By: Brooke Brooke [name] M.D. On: 11/16/2021 21:25   ______________________________________________________________________________________________      Physical Exam:    VS:  BP (!) 134/56 (BP Location: Left Arm, Patient Position: Sitting, Cuff Size: Normal)   Pulse 75   Ht 5' 6 (1.676 m)   Wt 147 lb 9.6 oz (67 kg)   SpO2 98%   BMI 23.82 kg/m    Wt Readings from Last 3 Encounters:  01/11/24 147 lb 9.6 oz (67 kg)   12/26/23 146 lb (66.2 kg)  09/04/23 153 lb 9.6 oz (69.7 kg)    Gen: no distress, elderly female   Neck: No JVD Cardiac: No Rubs or Gallops, harsh systolic standing Murmur, RRR +2 radial pulses Respiratory: Clear to auscultation bilaterally, normal effort, normal  respiratory rate GI: Soft, nontender, non-distended  MS: No  edema;  moves all extremities Integument: Skin feels warm Neuro:  At time of evaluation, alert and oriented to person/place/time/situation  Psych: Normal affect, patient feels ok   ASSESSMENT AND PLAN: .    Obstructive hypertrophic cardiomyopathy - NYHA II, Septal thickness 17 mm - BSH Variant - peak gradient  on 20 mm Hg on 2.5 mg, increase to 5 mg  - suspicion of Fabry's/Danon/Noonan's or other mimics of HCM: low - Gene variant: Deferred (children are not amenable to screening; gave information to them today)  - Non HCM Contributors to disease/status Nonobstructive coronary artery disease Nonobstructive coronary artery disease with calcium  score of 153. No significant blockages identified to account for current symptoms.  HTN- controlled AI/PI/Primary MR- no plans for surgery; surveillance as per CMI protocol  SCD  Assessment Risk of SCD at 5 years(%) 0.67 Recommendation Based on the absence of risk factors, this patient does not have an indication for an ICD (Class 3 - No Benefit)  Atrial fibrillation Assessment - HCM-AF score NA (age) - Atrial arrhythmia management: AF assessment in 2027   Medication symptom plan Obstructive hypertrophic cardiomyopathy Obstructive hypertrophic cardiomyopathy with symptoms of chest pain, shortness of breath, and fatigue. Symptoms improved on 5 mg of Mavacamten  but worsened on 2.5 mg. The gradient is a key factor in determining the dose, and a lower gradient is desired. She is currently back on 5 mg, which is expected to be the optimal dose. Open heart surgery is considered high risk due to her age, and alcohol septal  ablation is a potential alternative if medication is ineffective. The decision to maintain her on 5 mg is based on previous improvement in symptoms and the need to keep the gradient low. - Continue Mavacamten  5 mg daily - Schedule echocardiogram in 4 weeks and 12 weeks - Consider changing beta blocker if symptoms do not improve - Discuss alcohol septal ablation if medication is ineffective  Fatigue and dizziness associated with obstructive hypertrophic cardiomyopathy Fatigue and dizziness persist, with dizziness being more pronounced on 5 mg of Mavacamten  compared to 2.5 mg. Fatigue may be exacerbated by personal and caregiving responsibilities. She has support from family and community groups, which may help alleviate some fatigue. -  Encourage participation in support groups and social activities  Longitudinal care: The evaluation and management services provided today reflect the complexity inherent in caring for this patient, including the ongoing longitudinal relationship and management of multiple chronic conditions and/or the need for care coordination. The visit required a comprehensive assessment and management plan tailored to the patient's unique needs Time was spent addressing not only the acute concerns but also the broader context of the patient's health, including preventive care, chronic disease management, and care coordination as appropriate.  Complex longitudinal is necessary for conditions including: complex oHCM f/u    January f/u F/u with me and research in November   Brooke Leavens, Brooke [name] FASE [facility] Brooke [name] Cardiologist [facility] Brooke [name] [place]  [address] [place], [address] [place], [place] [address] [phone]  11:30 AM

## 2024-01-11 NOTE — Patient Instructions (Signed)
 Medication Instructions:  Your physician recommends that you continue on your current medications as directed. Please refer to the Current Medication list given to you today.  *If you need a refill on your cardiac medications before your next appointment, please call your pharmacy*  Lab Work: NONE  If you have labs (blood work) drawn today and your tests are completely normal, you will receive your results only by: MyChart Message (if you have MyChart) OR A paper copy in the mail If you have any lab test that is abnormal or we need to change your treatment, we will call you to review the results.  Testing/Procedures: NONE  Follow-Up: At [facility], you and your health needs are our priority.  As part of our continuing mission to provide you with exceptional heart care, our providers are all part of one team.  This team includes your primary Cardiologist (physician) and Advanced Practice Providers or APPs (Physician Assistants and Nurse Practitioners) who all work together to provide you with the care you need, when you need it.  Your next appointment:   4 month(s)  Provider:   Stanly Leavens, MD

## 2024-01-15 ENCOUNTER — Encounter: Payer: Self-pay | Admitting: Obstetrics and Gynecology

## 2024-01-15 ENCOUNTER — Encounter

## 2024-01-15 ENCOUNTER — Ambulatory Visit (INDEPENDENT_AMBULATORY_CARE_PROVIDER_SITE_OTHER): Admitting: Obstetrics and Gynecology

## 2024-01-15 VITALS — BP 171/69 | HR 60

## 2024-01-15 DIAGNOSIS — N813 Complete uterovaginal prolapse: Secondary | ICD-10-CM

## 2024-01-15 DIAGNOSIS — N3281 Overactive bladder: Secondary | ICD-10-CM | POA: Diagnosis not present

## 2024-01-15 DIAGNOSIS — I10 Essential (primary) hypertension: Secondary | ICD-10-CM | POA: Diagnosis not present

## 2024-01-15 DIAGNOSIS — F330 Major depressive disorder, recurrent, mild: Secondary | ICD-10-CM | POA: Diagnosis not present

## 2024-01-15 DIAGNOSIS — E039 Hypothyroidism, unspecified: Secondary | ICD-10-CM | POA: Diagnosis not present

## 2024-01-15 DIAGNOSIS — N816 Rectocele: Secondary | ICD-10-CM

## 2024-01-15 DIAGNOSIS — N1832 Chronic kidney disease, stage 3b: Secondary | ICD-10-CM | POA: Diagnosis not present

## 2024-01-15 DIAGNOSIS — Z960 Presence of urogenital implants: Secondary | ICD-10-CM | POA: Diagnosis not present

## 2024-01-15 DIAGNOSIS — E782 Mixed hyperlipidemia: Secondary | ICD-10-CM | POA: Diagnosis not present

## 2024-01-15 DIAGNOSIS — R339 Retention of urine, unspecified: Secondary | ICD-10-CM

## 2024-01-15 NOTE — Patient Instructions (Addendum)
 Continue estrogen cream x2   Consider the option of bladder medication if you continue to leak.

## 2024-01-15 NOTE — Progress Notes (Signed)
 [facility]   Subjective:     Chief Complaint:  Chief Complaint  Patient presents with   Follow-up    Brooke [name] is a 89 y.o. female here today for a  pessary check.   History of Present Illness: Brooke [name] is a 89 y.o. female with stage IV pelvic organ prolapse who presents for a pessary check. She is using a size #6 long stem gellhorn pessary. The pessary has been working well but she is having leakage with pessary in place. She is using vaginal estrogen. She denies vaginal bleeding.  Past Medical History: Patient  has a past medical history of Aortic insufficiency, CAD (coronary artery disease), native coronary artery, Chest pain, Chronic kidney disease, Diverticulitis, HTN (hypertension), IBS (irritable bowel syndrome), Myopathy, Osteopenia, and Pulmonic regurgitation.   Past Surgical History: She  has a past surgical history that includes Cardiac catheterization; Dilation and curettage of uterus; and Cholecystectomy (N/A, 12/15/2021).   Medications: She has a current medication list which includes the following prescription(s): acetaminophen , alendronate, alpha-d-galactosidase, ampicillin , antiseptic oral rinse, aspirin, bacillus coagulans-inulin, calcium  carbonate, multiple vitamins-minerals, carvedilol , vitamin d-3, colchicine , cyanocobalamin , diphenhydramine -apap (sleep), estradiol, famotidine , lactase, levothyroxine , loperamide -simethicone , loratadine, mavacamten , multiple vitamins-minerals, rosuvastatin , simethicone , telmisartan , and benefiber.   Allergies: Patient is allergic to macrobid [nitrofurantoin monohyd macro], nitrofurantoin, sulfa antibiotics, amlodipine , and ciprofloxacin.   Social History: Patient  reports that she has never smoked. She has never used smokeless tobacco. She reports current alcohol use. She reports that she does not use drugs.      Objective:     Post Void Residual - 01/15/24 0836       Post Void Residual   Post Void  Residual 38 mL          Physical Exam: BP (!) 171/69   Pulse 60  Gen: No apparent distress, A&O x 3. Detailed Urogynecologic Evaluation:  Pelvic Exam: Normal external female genitalia; Bartholin's and Skene's glands normal in appearance; urethral meatus prolapsed, no urethral masses or discharge. The pessary was noted to be in place. It was removed and cleaned. Speculum exam revealed no lesions in the vagina. The pessary was replaced. It was comfortable to the patient and fit well.    Assessment/Plan:    Assessment: Brooke [name] is a 89 y.o. with stage IV pelvic organ prolapse here for a pessary check. She is doing well.  Plan: She will keep the pessary in place until next visit. She will continue to use estrogen. She will follow-up in 3 months for a pessary check or sooner as needed.   Patient's PVR by bladder scan was 38 today which is much improved from 12/26/23 as she had 451 in the bladder PVR at that time.   We discussed today considering adding in a medication to support her bladder. Patient is open to this but wants to check with her cardiology team and pharmacy before starting a medication. We are considering doing Gemtesa 75mg  daily or Trospium 60mg  ER daily. She is not eligible for other anticholinergics and is not a candidate for Myrbetriq due to her blood pressure. Patient to consider medication options.   All questions were answered.

## 2024-01-16 ENCOUNTER — Other Ambulatory Visit: Payer: Self-pay | Admitting: Internal Medicine

## 2024-01-22 ENCOUNTER — Encounter

## 2024-01-23 DIAGNOSIS — I421 Obstructive hypertrophic cardiomyopathy: Secondary | ICD-10-CM | POA: Diagnosis not present

## 2024-01-23 DIAGNOSIS — D631 Anemia in chronic kidney disease: Secondary | ICD-10-CM | POA: Diagnosis not present

## 2024-01-23 DIAGNOSIS — N1832 Chronic kidney disease, stage 3b: Secondary | ICD-10-CM | POA: Diagnosis not present

## 2024-01-23 DIAGNOSIS — N2581 Secondary hyperparathyroidism of renal origin: Secondary | ICD-10-CM | POA: Diagnosis not present

## 2024-01-23 DIAGNOSIS — I129 Hypertensive chronic kidney disease with stage 1 through stage 4 chronic kidney disease, or unspecified chronic kidney disease: Secondary | ICD-10-CM | POA: Diagnosis not present

## 2024-01-23 DIAGNOSIS — E785 Hyperlipidemia, unspecified: Secondary | ICD-10-CM | POA: Diagnosis not present

## 2024-01-29 ENCOUNTER — Encounter

## 2024-01-30 DIAGNOSIS — Z974 Presence of external hearing-aid: Secondary | ICD-10-CM | POA: Diagnosis not present

## 2024-01-30 DIAGNOSIS — H6123 Impacted cerumen, bilateral: Secondary | ICD-10-CM | POA: Diagnosis not present

## 2024-01-31 ENCOUNTER — Ambulatory Visit (HOSPITAL_COMMUNITY)
Admission: RE | Admit: 2024-01-31 | Discharge: 2024-01-31 | Disposition: A | Discharge status: Home / Self Care | Source: Ambulatory Visit | Attending: Internal Medicine | Admitting: Internal Medicine

## 2024-01-31 DIAGNOSIS — I421 Obstructive hypertrophic cardiomyopathy: Secondary | ICD-10-CM | POA: Diagnosis not present

## 2024-01-31 LAB — ECHOCARDIOGRAM LIMITED
Area-P 1/2: 3.63 cm2
S' Lateral: 2.2 cm

## 2024-02-04 ENCOUNTER — Ambulatory Visit: Payer: Self-pay | Admitting: Internal Medicine

## 2024-02-04 DIAGNOSIS — I421 Obstructive hypertrophic cardiomyopathy: Secondary | ICD-10-CM

## 2024-02-04 NOTE — Addendum Note (Signed)
 Addended by: RANDY HAMP SAILOR on: 02/04/2024 12:14 PM   Modules accepted: Orders

## 2024-02-04 NOTE — Telephone Encounter (Signed)
 Ms. Matus is doing well  Feel generally well but experience some fatigue, which they attribute to overexertion from several weekend events. They are monitoring the fatigue to see if it resolves naturally. No new medications have been reported. No new symptoms aside from fatigue (unchanged)  Continue mavacamten  5 mg - echo in 12 weeks as per protocol - November screening (research) - son will see us  in November - reviewed 2023 CMR; will not screen for ATTR-CA at this time.  Stanly Leavens, MD FASE [facility] Cardiologist [facility]  [address] [place], [place] [address] [phone]  10:14 AM

## 2024-02-07 DIAGNOSIS — H903 Sensorineural hearing loss, bilateral: Secondary | ICD-10-CM | POA: Diagnosis not present

## 2024-02-15 DIAGNOSIS — E782 Mixed hyperlipidemia: Secondary | ICD-10-CM | POA: Diagnosis not present

## 2024-02-15 DIAGNOSIS — I10 Essential (primary) hypertension: Secondary | ICD-10-CM | POA: Diagnosis not present

## 2024-02-15 DIAGNOSIS — F330 Major depressive disorder, recurrent, mild: Secondary | ICD-10-CM | POA: Diagnosis not present

## 2024-02-15 DIAGNOSIS — E039 Hypothyroidism, unspecified: Secondary | ICD-10-CM | POA: Diagnosis not present

## 2024-02-22 DIAGNOSIS — H903 Sensorineural hearing loss, bilateral: Secondary | ICD-10-CM | POA: Diagnosis not present

## 2024-02-22 DIAGNOSIS — N1832 Chronic kidney disease, stage 3b: Secondary | ICD-10-CM | POA: Diagnosis not present

## 2024-02-22 DIAGNOSIS — M810 Age-related osteoporosis without current pathological fracture: Secondary | ICD-10-CM | POA: Diagnosis not present

## 2024-02-22 DIAGNOSIS — E782 Mixed hyperlipidemia: Secondary | ICD-10-CM | POA: Diagnosis not present

## 2024-02-22 DIAGNOSIS — I421 Obstructive hypertrophic cardiomyopathy: Secondary | ICD-10-CM | POA: Diagnosis not present

## 2024-02-22 DIAGNOSIS — I10 Essential (primary) hypertension: Secondary | ICD-10-CM | POA: Diagnosis not present

## 2024-02-22 DIAGNOSIS — R350 Frequency of micturition: Secondary | ICD-10-CM | POA: Diagnosis not present

## 2024-02-26 ENCOUNTER — Other Ambulatory Visit (HOSPITAL_BASED_OUTPATIENT_CLINIC_OR_DEPARTMENT_OTHER): Payer: Self-pay | Admitting: Family Medicine

## 2024-02-26 DIAGNOSIS — M810 Age-related osteoporosis without current pathological fracture: Secondary | ICD-10-CM

## 2024-03-16 DIAGNOSIS — E782 Mixed hyperlipidemia: Secondary | ICD-10-CM | POA: Diagnosis not present

## 2024-03-16 DIAGNOSIS — E039 Hypothyroidism, unspecified: Secondary | ICD-10-CM | POA: Diagnosis not present

## 2024-03-16 DIAGNOSIS — F330 Major depressive disorder, recurrent, mild: Secondary | ICD-10-CM | POA: Diagnosis not present

## 2024-03-16 DIAGNOSIS — I10 Essential (primary) hypertension: Secondary | ICD-10-CM | POA: Diagnosis not present

## 2024-03-21 ENCOUNTER — Ambulatory Visit (HOSPITAL_COMMUNITY)
Admission: RE | Admit: 2024-03-21 | Discharge: 2024-03-21 | Disposition: A | Source: Ambulatory Visit | Attending: Internal Medicine

## 2024-03-21 ENCOUNTER — Other Ambulatory Visit: Payer: Self-pay | Admitting: Internal Medicine

## 2024-03-21 DIAGNOSIS — I421 Obstructive hypertrophic cardiomyopathy: Secondary | ICD-10-CM | POA: Diagnosis not present

## 2024-03-21 LAB — ECHOCARDIOGRAM COMPLETE
Area-P 1/2: 3.48 cm2
Est EF: >75
P 1/2 time: 435 ms
S' Lateral: 2.4 cm

## 2024-03-23 ENCOUNTER — Ambulatory Visit: Payer: Self-pay | Admitting: Internal Medicine

## 2024-03-23 DIAGNOSIS — I421 Obstructive hypertrophic cardiomyopathy: Secondary | ICD-10-CM

## 2024-03-24 MED ORDER — CAMZYOS 5 MG PO CAPS: 5.0000 mg | ORAL_CAPSULE | Freq: Every day | ORAL | 4 refills | Status: AC

## 2024-04-15 ENCOUNTER — Encounter: Payer: Self-pay | Admitting: Obstetrics and Gynecology

## 2024-04-15 ENCOUNTER — Ambulatory Visit: Admitting: Obstetrics and Gynecology

## 2024-04-15 VITALS — BP 172/60 | HR 67 | Wt 146.0 lb

## 2024-04-15 DIAGNOSIS — N816 Rectocele: Secondary | ICD-10-CM

## 2024-04-15 DIAGNOSIS — N813 Complete uterovaginal prolapse: Secondary | ICD-10-CM | POA: Diagnosis not present

## 2024-04-15 DIAGNOSIS — Z960 Presence of urogenital implants: Secondary | ICD-10-CM | POA: Diagnosis not present

## 2024-04-15 NOTE — Progress Notes (Signed)
  Urogynecology   Subjective:     Chief Complaint:  No chief complaint on file.  History of Present Illness: Brooke [name] is a 89 y.o. female with stage IV pelvic organ prolapse who presents for a pessary check. She is using a size #6 long stem gellhorn pessary. The pessary has been working well but she is having leakage with pessary in place. She is using vaginal estrogen. She denies vaginal bleeding.  Patient's overactive bladder has not been as bothersome. She reports minimal leakage and states she is able to live with the amount of leakage she has been having.   Past Medical History: Patient  has a past medical history of Aortic insufficiency, CAD (coronary artery disease), native coronary artery, Chest pain, Chronic kidney disease, Diverticulitis, HTN (hypertension), IBS (irritable bowel syndrome), Myopathy, Osteopenia, and Pulmonic regurgitation.   Past Surgical History: She  has a past surgical history that includes Cardiac catheterization; Dilation and curettage of uterus; and Cholecystectomy (N/A, 12/15/2021).   Medications: She has a current medication list which includes the following prescription(s): acetaminophen , alendronate, alpha-d-galactosidase, ampicillin , antiseptic oral rinse, aspirin, bacillus coagulans-inulin, calcium  carbonate, multiple vitamins-minerals, carvedilol , vitamin d-3, colchicine , cyanocobalamin , diphenhydramine -apap (sleep), estradiol, famotidine , lactase, levothyroxine , loperamide -simethicone , loratadine, camzyos , multiple vitamins-minerals, rosuvastatin , simethicone , telmisartan , and benefiber.   Allergies: Patient is allergic to macrobid [nitrofurantoin monohyd macro], nitrofurantoin, sulfa antibiotics, amlodipine , and ciprofloxacin.   Social History: Patient  reports that she has never smoked. She has never used smokeless tobacco. She reports current alcohol use. She reports that she does not use drugs.      Objective:     Physical  Exam: BP (!) 147/69   Pulse 74   Wt 146 lb (66.2 kg)   BMI 23.57 kg/m  Gen: No apparent distress, A&O x 3. Detailed Urogynecologic Evaluation:  Pelvic Exam: Normal external female genitalia; Bartholin's and Skene's glands normal in appearance; urethral meatus prolapsed, no urethral masses or discharge. The pessary was noted to be in place. It was removed and cleaned. Speculum exam revealed no lesions in the vagina. The pessary was replaced. It was comfortable to the patient and fit well.    Assessment/Plan:    Assessment: Brooke [name] is a 89 y.o. with stage IV pelvic organ prolapse here for a pessary check. She is doing well.  Plan: She will keep the pessary in place until next visit. She will continue to use estrogen. She will follow-up in 3 months for a pessary check or sooner as needed.    All questions were answered.

## 2024-05-02 ENCOUNTER — Ambulatory Visit: Attending: Internal Medicine | Admitting: Internal Medicine

## 2024-05-02 VITALS — BP 128/66 | HR 64 | Ht 67.0 in | Wt 146.8 lb

## 2024-05-02 DIAGNOSIS — I2510 Atherosclerotic heart disease of native coronary artery without angina pectoris: Secondary | ICD-10-CM

## 2024-05-02 DIAGNOSIS — I421 Obstructive hypertrophic cardiomyopathy: Secondary | ICD-10-CM | POA: Diagnosis not present

## 2024-05-02 DIAGNOSIS — I351 Nonrheumatic aortic (valve) insufficiency: Secondary | ICD-10-CM

## 2024-05-02 NOTE — Patient Instructions (Signed)
 Medication Instructions:  No changes, continue medications  *If you need a refill on your cardiac medications before your next appointment, please call your pharmacy*    Follow-Up: At [facility], you and your health needs are our priority.  As part of our continuing mission to provide you with exceptional heart care, our providers are all part of one team.  This team includes your primary Cardiologist (physician) and Advanced Practice Providers or APPs (Physician Assistants and Nurse Practitioners) who all work together to provide you with the care you need, when you need it.  Your next appointment:   7 month(s)  Provider:   Orren Fabry, PA-C          We recommend signing up for the patient portal called MyChart.  Sign up information is provided on this After Visit Summary.  MyChart is used to connect with patients for Virtual Visits (Telemedicine).  Patients are able to view lab/test results, encounter notes, upcoming appointments, etc.  Non-urgent messages can be sent to your provider as well.   To learn more about what you can do with MyChart, go to [email or web address].   Other Instructions

## 2024-05-02 NOTE — Progress Notes (Signed)
 " Cardiology Office Note:  .    Date:  05/02/2024  ID:  Brooke [name], DOB 1934-06-24, MRN [id number] PCP: Chrystal Lamarr RAMAN, MD  [facility] Providers Cardiologist:  Wilbert Bihari, MD     CC: [facility] f/u  History of Present Illness: .    Brooke [name] is a 89 y.o. female with hypertrophic obstructive cardiomyopathy who presents for follow-up on Mavacamten  therapy. 2025: Established with me; I screened her son.  She experiences persistent fatigue, which she attributes partly to her caregiving responsibilities for her husband, who requires significant assistance due to aphasia and memory issues. She also experiences dizziness and shortness of breath when bending over. Her blood pressure is labile, with fluctuations noted throughout the day.  Her symptoms of shortness of breath and chest pain have improved with cardiac myosin inhibitors. She is currently on carvedilol , taken twice daily, and Mavacamten  for her hypertrophic cardiomyopathy.  Her last echocardiogram on March 23, 2024, showed moderate aortic and mitral regurgitation.  Relevant histories: .  Social  - She is the primary caregiver for her husband, who has dementia, and is concerned about her ability to continue in this role due to her symptoms - has four children, one of whom is non-amenable to screening ROS: As per HPI.   Studies Reviewed: .     Cardiac Studies & Procedures   ______________________________________________________________________________________________   STRESS TESTS  ECHOCARDIOGRAM STRESS TEST 06/27/2023  Narrative EXERCISE STRESS ECHO REPORT   --------------------------------------------------------------------------------  Patient Name:   Brooke [name] Date of Exam: 06/27/2023 Medical Rec #:  [id number]       Height:       67.0 in Accession #:    [id number]      Weight:       152.0 lb Date of Birth:  [birth year]       BSA:          1.800 m Patient Age:    88 years        BP:            131/52 mmHg Patient Gender: F               HR:           69 bpm. Exam Location:  [address]  Procedure: 2D Echo, Limited Color Doppler, Color Doppler and Stress Echo  Indications:     HOCM evaluation-squat to stand protocol Dyspnea on exertion R06.00  History:         Patient has prior history of Echocardiogram examinations, most recent 05/24/2023. Previous echo revealed LVEF 75% severe LVH.  Sonographer:     Nolon Berg [facility], RDCS Referring Phys:  [id number] [facility] DELENA LEAVENS Diagnosing Phys: Soyla Merck MD  IMPRESSIONS   1. Squat to Stand protocol (Mayo protocol). Dynamic maneuvers performed to elicit LVOT/intracavitary gradient. 2. The findings of the study demonstrate that with dynamic maneuvers, a dynamic outflow tract obstruction is present (peak LVOT gradient 67 mmHG). 3. At rest, intracavitary interrogation demonstrates max instantaneous gradient of 16 mmHg, and with Valsalva intracavitary gradient is 49 mmHg. With Valsalva, the LVOT max instantaneous gradient is 67 mmHg. The level of obstruction for maximum gradient is likely the basal-mid septum. Repetitive squat to stand and treadmill exercise failed to demonstrate a dyanmic late peaking signal, and are indeterminate for obstructive physiology. 4. Left atrial size is mildly dilated. Left atrial reservoir strain is reduced at 20.6%. 5. This is an inconclusive stress echocardiogram for ischemia. Non-diagnostic study for  ischemia due to protocol. Patient had below average exercise capacity limited by shortness of breath, exercising for 2 minutes and 5 seconds acheiving 76% predicted HR. 6. Baseline echo findings: EF 70-75%, concentric LVH with 15 mm wall thickness, mild to mild-moderate AI incompletely interrogated, degenerative mitral valve with no definite systolic anterior motion of the mitral valve, mild MR that does not significantly increase with dynamic maneuvers.  FINDINGS  Exam Protocol: The patient  exercised on a treadmill according to a Bruce protocol. Squat to stand protocol.   Patient Performance: The patient exercised for 2 minutes and 05 seconds, achieving 4.6 METS. The maximum stage achieved was I of the Bruce protocol. The baseline heart rate was 66 bpm. The heart rate at peak stress was 92 bpm. The target heart rate was calculated to be 113 bpm. The percentage of maximum predicted heart rate achieved was 68.9 %. The baseline blood pressure was 131/52 mmHg. The blood pressure at peak stress was 162/67 mmHg. The blood pressure response was normal. The patient developed shortness of breath during the stress exam. The symptoms resolved with rest. The patient's functional capacity was below average.  EKG: Resting EKG showed normal sinus rhythm with no abnormal findings. The patient developed no abnormal EKG findings during exercise.   2D Echo Findings: The baseline ejection fraction was 70-75%. The peak ejection fraction at stress was 75%. Baseline regional wall motion abnormalities were not present. This is an inconclusive stress echocardiogram for ischemia. This is a non-diagnostic study for ischemia due to protocol.  Stress Doppler:  LVOT: The baseline LVOT gradient was 16 mmHG. The peak LVOT gradient at stress was 67 mmHG. The findings of the study demonstrate that a stress-induced outflow tract obstruction is present.  Mitral Regurgitation: At baseline, mild mitral regurgitation was present.   Soyla Merck MD Electronically signed on 06/27/2023 at 5:46:22 PM      Final   ECHOCARDIOGRAM  ECHOCARDIOGRAM COMPLETE 03/21/2024  Narrative ECHOCARDIOGRAM REPORT    Patient Name:   Brooke [name] Date of Exam: 03/21/2024 Medical Rec #:  [id number]       Height:       66.0 in Accession #:    [id number]      Weight:       147.6 lb Date of Birth:  Jul 01, 1934       BSA:          1.758 m Patient Age:    89 years        BP:           177/80 mmHg Patient Gender: F                HR:           68 bpm. Exam Location:  [address]  Procedure: Color Doppler, Cardiac Doppler, 2D Echo and 3D Echo (Both Spectral and Color Flow Doppler were utilized during procedure).  Indications:    HOCM I42.1; 5MG  Camzyos   History:        Patient has prior history of Echocardiogram examinations, most recent 02/21/2024. CAD; Risk Factors:Hypertension.  Sonographer:    Augustin Seals RDCS Referring Phys: [id number] [facility] A SANTO   Sonographer Comments: Global longitudinal strain was attempted. IMPRESSIONS   1. 5mg  Camzyos  LVOT gradient at rest 9.53mmHg with Valsalva 10.70mmHg. 2. Left ventricular ejection fraction, by estimation, is >75%. Left ventricular ejection fraction by 3D volume is 75 %. The left ventricle has hyperdynamic function. The left ventricle has no regional wall motion abnormalities. There  is moderate left ventricular hypertrophy. Left ventricular diastolic function could not be evaluated. Elevated left atrial pressure. The E/e' is 22. 3. Right ventricular systolic function is normal. The right ventricular size is normal. There is normal pulmonary artery systolic pressure. The estimated right ventricular systolic pressure is 26.2 mmHg. 4. The mitral valve is degenerative. Moderate mitral valve regurgitation. 5. The aortic valve is grossly normal. Aortic valve regurgitation is moderate. 6. The inferior vena cava is normal in size with greater than 50% respiratory variability, suggesting right atrial pressure of 3 mmHg. 7. Left atrial size was grossly normal. 8. Right atrial size was grossly normal.  FINDINGS Left Ventricle: Left ventricular ejection fraction, by estimation, is >75%. Left ventricular ejection fraction by 3D volume is 75 %. The left ventricle has hyperdynamic function. The left ventricle has no regional wall motion abnormalities. The left ventricular internal cavity size was normal in size. There is moderate left ventricular hypertrophy. Left  ventricular diastolic function could not be evaluated due to nondiagnostic images. Left ventricular diastolic function could not be evaluated. Elevated left atrial pressure. The E/e' is 22.  Right Ventricle: The right ventricular size is normal. No increase in right ventricular wall thickness. Right ventricular systolic function is normal. There is normal pulmonary artery systolic pressure. The tricuspid regurgitant velocity is 2.41 m/s, and with an assumed right atrial pressure of 3 mmHg, the estimated right ventricular systolic pressure is 26.2 mmHg.  Left Atrium: Left atrial size was grossly normal.  Right Atrium: Right atrial size was grossly normal.  Pericardium: There is no evidence of pericardial effusion.  Mitral Valve: The mitral valve is degenerative in appearance. Mild to moderate mitral annular calcification. Moderate mitral valve regurgitation.  Tricuspid Valve: The tricuspid valve is normal in structure. Tricuspid valve regurgitation is mild . No evidence of tricuspid stenosis.  Aortic Valve: The aortic valve is grossly normal. Aortic valve regurgitation is moderate. Aortic regurgitation PHT measures 435 msec.  Pulmonic Valve: The pulmonic valve was not well visualized.  Aorta: The aortic root and ascending aorta are structurally normal, with no evidence of dilitation.  Venous: The inferior vena cava is normal in size with greater than 50% respiratory variability, suggesting right atrial pressure of 3 mmHg.  IAS/Shunts: The atrial septum is grossly normal.  Additional Comments: 3D was performed not requiring image post processing on an independent workstation and was normal.   LEFT VENTRICLE PLAX 2D LVIDd:         4.10 cm         Diastology LVIDs:         2.40 cm         LV e' medial:    5.19 cm/s LV PW:         1.30 cm         LV E/e' medial:  19.8 LV IVS:        1.40 cm         LV e' lateral:   4.38 cm/s LVOT diam:     2.10 cm         LV E/e' lateral: 23.5 LV SV:          114 LV SV Index:   65 LVOT Area:     3.46 cm        3D Volume EF LV 3D EF:    Left ventricul ar ejection fraction by 3D volume is 75 %.  3D Volume EF: 3D EF:        75 %  LV EDV:       171 ml LV ESV:       42 ml LV SV:        129 ml  IVC IVC diam: 1.40 cm  PULMONARY VEINS A Reversal Velocity: 37.70 cm/s Diastolic Velocity:  53.80 cm/s S/D Velocity:        1.60 Systolic Velocity:   83.90 cm/s  LEFT ATRIUM         Index LA diam:    3.90 cm 2.22 cm/m AORTIC VALVE LVOT Vmax:   147.00 cm/s LVOT Vmean:  98.500 cm/s LVOT VTI:    0.328 m AI PHT:      435 msec  AORTA Ao Root diam: 3.10 cm Ao Asc diam:  3.40 cm  MITRAL VALVE                TRICUSPID VALVE MV Area (PHT): 3.48 cm     TR Peak grad:   23.2 mmHg MV Decel Time: 218 msec     TR Vmax:        241.00 cm/s MV E velocity: 103.00 cm/s MV A velocity: 128.00 cm/s  SHUNTS MV E/A ratio:  0.80         Systemic VTI:  0.33 m Systemic Diam: 2.10 cm  Sunit Tolia Electronically signed by Madonna Large Signature Date/Time: 03/21/2024/3:41:29 PM    Final      CT SCANS  CT CORONARY MORPH W/CTA COR W/SCORE 08/16/2020  Addendum 08/16/2020  1:44 PM ADDENDUM REPORT: 08/16/2020 13:42  CLINICAL DATA:  Chest pain  EXAM: Cardiac CTA  MEDICATIONS: Sub lingual nitro. 4 mg and lopressor  100mg   TECHNIQUE: The patient was scanned on a [employer] 192 scanner. Gantry rotation speed was 250 msecs. Collimation was. 6 mm . A 120 kV prospective scan was triggered in the ascending thoracic aorta at 140 HU's with full mA between 30-70% of the R-R interval . Average HR during the scan was 63 bpm. The 3D data set was interpreted on a dedicated work station using MPR, MIP and VRT modes. A total of 80 cc of contrast was used.  FINDINGS: Non-cardiac: See separate report from [facility]. No significant findings on limited lung and soft tissue windows.  Calcium  score: 153 isolated to proximal LAD  Coronary  Arteries: Right dominant with no anomalies  LM: Normal  LAD: Tortuous vessel, 25-49% calcified plaque in proximal vessel there is some diffuse narrowing 25-49% with soft plaque in the distal vessel after D2 and before D3  D1: Normal  D2: Normal  D3: Normal  Circumflex: Tortuous vessels normal  OM1: Normal  OM2: Normal  RCA: Normal  PDA: Normal  PLA: Normal  IMPRESSION: 1. Calcium  score 153 which is 51 st percentile for age and sex  2.  CAD RADS 2 non obstructive CAD in LAD see description above  3.  Normal aortic root 3.3 cm  Maude Emmer   Electronically Signed By: Maude Emmer M.D. On: 08/16/2020 13:42  Narrative EXAM: OVER-READ INTERPRETATION  CT CHEST  The following report is an over-read performed by radiologist Dr. Toribio Aye of [facility] on 08/16/2020. This over-read does not include interpretation of cardiac or coronary anatomy or pathology. The coronary calcium  score/coronary CTA interpretation by the cardiologist is attached.  COMPARISON:  None.  FINDINGS: Atherosclerotic calcifications in the thoracic aorta. In the left upper lobe (axial image 14 of series 10 and coronal image 39 of series 602) there is a irregular-shaped 1.7 x 0.8 x 0.8  cm nodule with slightly indistinct margins. Within the visualized portions of the thorax there is no acute consolidative airspace disease, no pleural effusions, no pneumothorax and no lymphadenopathy. Visualized portions of the upper abdomen are unremarkable. There are no aggressive appearing lytic or blastic lesions noted in the visualized portions of the skeleton.  IMPRESSION: 1. 1.7 x 0.8 x 0.8 cm left upper lobe nodule with slightly indistinct margins. This may simply be infectious or inflammatory in etiology, however, underlying neoplasm is not excluded. Short-term follow-up noncontrast chest CT is recommended in 1-2 months to ensure the stability or regression of this finding. 2.   Aortic Atherosclerosis (ICD10-I70.0).  Electronically Signed: By: Toribio Aye M.D. On: 08/16/2020 13:01   CARDIAC MRI  MR CARDIAC MORPHOLOGY W WO CONTRAST 11/16/2021  Narrative CLINICAL DATA:  Clinical question of infiltrative cardiomyopathy Study assumes BSA of 1.78 m2.  EXAM: CARDIAC MRI  TECHNIQUE: The patient was scanned on a 1.5 Tesla GE magnet. A dedicated cardiac coil was used. Functional imaging was done using [employer] sequences. 2,3, and 4 chamber views were done to assess for RWMA's. Modified Simpson's rule using a short axis stack was used to calculate an ejection fraction on a dedicated work [employer]. The patient received 10 cc of Gadavist . After 10 minutes inversion recovery sequences were used to assess for infiltration and scar tissue.  CONTRAST:  10 cc  of Gadavist   FINDINGS: 1. Normal left ventricular size, with LVEDD 39 mm, and LVEDVi 46 mL/m2.  Mild concentric remodeling, with intraventricular septal thickness of 11 mm, posterior wall thickness of 11 mm, and myocardial mass index of 48 g/m2.  Normal left ventricular systolic function (LVEF =65%). There are no regional wall motion abnormalities, in the three chamber view there is evidence of systolic anterior motion of the mitral valve and LVOT obstruction.  Left ventricular parametric mapping notable for normal native T1 and T2.  There is no late gadolinium enhancement in the left ventricular myocardium.  2. Normal right ventricular size with RVEDVI 55 mL/m2.  Normal right ventricular thickness.  Normal right ventricular systolic function (RVEF =59%). There are no regional wall motion abnormalities or aneurysms.  There is notable fat overlying the right ventricular pericardium without fatty infiltration.  3.  Normal left and right atrial size.  4.  Normal size of the aortic root, ascending aorta.  Mild pulmonary artery dilation 29 mm.  5. Valve  assessment:  Aortic Valve: Morphology not well visualized. Mild aortic regurgitation, regurgitant fraction 17%.  Pulmonic Valve: No significant pulmonary insufficiency, regurgitant fraction 1.4%.  Tricuspid Valve: Mild tricuspid regurgitation.  Mitral Valve: Mild mitral regurgitation, regurgitant fraction 8%.  6.  Normal pericardium.  No pericardial effusion.  7. Grossly, no extracardiac findings. Recommended dedicated study if concerned for non-cardiac pathology.  IMPRESSION: Normal LVEF  Normal parametric mapping and no LGE; not consistent with infiltrative LGE.  Evidence of LVOT obstruction in three chamber view without evidence of significant LV hypertrophy.  Mild Aortic regurgitation.  Stanly Leavens MD   Electronically Signed By: Stanly Leavens M.D. On: 11/16/2021 21:25   ______________________________________________________________________________________________      Physical Exam:    VS:  BP 128/66 (BP Location: Right Arm)   Pulse 64   Ht 5' 7 (1.702 m)   Wt 146 lb 12.8 oz (66.6 kg)   SpO2 95%   BMI 22.99 kg/m    Wt Readings from Last 3 Encounters:  05/02/24 146 lb 12.8 oz (66.6 kg)  04/15/24 146 lb (  66.2 kg)  01/11/24 147 lb 9.6 oz (67 kg)    Gen: no distress, elderly female   Neck: No JVD Cardiac: No Rubs or Gallops, systolic Murmur, RRR +2 radial pulses Respiratory: Clear to auscultation bilaterally, normal effort, normal  respiratory rate GI: Soft, nontender, non-distended  MS: No  edema;  moves all extremities Integument: Skin feels warm Neuro:  At time of evaluation, alert and oriented to person/place/time/situation  Psych: Normal affect, patient feels ok  ASSESSMENT AND PLAN: .    Obstructive hypertrophic cardiomyopathy - NYHA II, Septal thickness 17 mm - BSH Variant - peak gradient < 30 mm Hg on 5 mg mavacamten  - suspicion of Fabry's/Danon/Noonan's or other mimics of HCM: low - Gene variant: Deferred (children are  not amenable to screening save for Chyrl, who we see via echo screening) - Stress echo 06/27/23 with no high risk features  - Non HCM Contributors to disease/status Nonobstructive coronary artery disease Nonobstructive coronary artery disease with calcium  score of 153. No significant blockages identified to account for current symptoms.  - LDL at goal  Fatigue and HTN - no change to BB, needs caregiver support  Aortic valve regurgitation Mild to moderate aortic regurgitation with variability in severity. No significant aortic valve calcifications on previous CT scan. Potential future intervention with TAVR if condition worsens, though currently not indicated due to age and complexity of intervention. - Continue to monitor aortic regurgitation with echocardiogram scheduled for May 2026. - Will discuss potential future TAVR if condition worsens.  Mitral valve regurgitation Moderate mitral regurgitation noted on previous echocardiogram. No immediate intervention required. - Continue monitoring mitral regurgitation with routine echocardiograms.  SCD  Assessment Risk of SCD at 5 years 0.67% Recommendation Based on the absence of risk factors, this patient does not have an indication for an ICD (Class 3 - No Benefit)  Atrial fibrillation Assessment - HCM-AF score NA (age) - Atrial arrhythmia management: AF assessment in 2027   Medication symptom plan Persistent fatigue, improved chest pain and shortness of breath with Mavacamten  therapy. Labile blood pressures with occasional hypotension. Fatigue may be multifactorial, including potential depression and caregiver fatigue. Beta blocker therapy with carvedilol  is effective but may contribute to fatigue. Decision made not to reduce beta blocker dose due to labile blood pressures and potential for increased fatigue. - Continue Mavacamten  therapy. - Continue carvedilol  therapy. - Encouraged lifestyle modifications to manage fatigue, including  seeking additional support for caregiving responsibilities. - Will discuss potential respite care or treatment for depression with primary care provider.   Longitudinal care: The evaluation and management services provided today reflect the complexity inherent in caring for this patient, including the ongoing longitudinal relationship and management of multiple chronic conditions and/or the need for care coordination. The visit required a comprehensive assessment and management plan tailored to the patient's unique needs Time was spent addressing not only the acute concerns but also the broader context of the patient's health, including preventive care, chronic disease management, and care coordination as appropriate.  Complex longitudinal is necessary for conditions including: complex oHCM f/u   Echo in May as per CMI protocol Tessa in the fall  Stanly Leavens, MD FASE [facility] Cardiologist [facility]  [address] [place], [address] [place], [place] [address] [phone]  2:32 PM  "

## 2024-05-21 ENCOUNTER — Ambulatory Visit: Admitting: Internal Medicine

## 2024-07-14 ENCOUNTER — Ambulatory Visit: Admitting: Obstetrics

## 2024-09-01 ENCOUNTER — Ambulatory Visit (HOSPITAL_COMMUNITY)
